# Patient Record
Sex: Female | Born: 1990 | Race: Black or African American | Hispanic: No | Marital: Married | Smoking: Former smoker
Health system: Southern US, Community
[De-identification: ages and names within clinical notes are randomized; demographics above are authoritative.]

## PROBLEM LIST (undated history)

## (undated) DIAGNOSIS — Z309 Encounter for contraceptive management, unspecified: Secondary | ICD-10-CM

## (undated) DIAGNOSIS — D649 Anemia, unspecified: Secondary | ICD-10-CM

## (undated) DIAGNOSIS — I10 Essential (primary) hypertension: Secondary | ICD-10-CM

## (undated) DIAGNOSIS — O164 Unspecified maternal hypertension, complicating childbirth: Secondary | ICD-10-CM

## (undated) DIAGNOSIS — Z789 Other specified health status: Secondary | ICD-10-CM

## (undated) DIAGNOSIS — Z3046 Encounter for surveillance of implantable subdermal contraceptive: Principal | ICD-10-CM

## (undated) DIAGNOSIS — E669 Obesity, unspecified: Secondary | ICD-10-CM

## (undated) DIAGNOSIS — Z331 Pregnant state, incidental: Secondary | ICD-10-CM

## (undated) HISTORY — DX: Obesity, unspecified: E66.9

## (undated) HISTORY — DX: Essential (primary) hypertension: I10

## (undated) HISTORY — DX: Other specified health status: Z78.9

## (undated) HISTORY — DX: Encounter for contraceptive management, unspecified: Z30.9

## (undated) HISTORY — DX: Encounter for surveillance of implantable subdermal contraceptive: Z30.46

## (undated) HISTORY — PX: WISDOM TOOTH EXTRACTION: SHX21

## (undated) HISTORY — DX: Pregnant state, incidental: Z33.1

---
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
## 2010-03-11 ENCOUNTER — Emergency Department (HOSPITAL_COMMUNITY): Admission: EM | Admit: 2010-03-11 | Discharge: 2010-03-11 | Payer: Self-pay | Admitting: Emergency Medicine

## 2010-12-03 NOTE — H&P (Signed)
NAME:  Kristina Scott, Kristina Scott NO.:  [id number]   MEDICAL RECORD NO.:  [id number]          PATIENT TYPE:  INP   LOCATION:  LDR1                           FACILITY:   PHYSICIAN:  Lazaro Arms, M.D.   DATE OF BIRTH:  1990-08-01   DATE OF ADMISSION:  11/29/2006  DATE OF DISCHARGE:  LH                              HISTORY & PHYSICAL   Kristina Scott is a 20 year old African American female, gravida 1, para 0  with estimated date of delivery of Nov 25, 2006, at 40-4/[redacted] weeks  gestation, who is admitted in an active phase of labor.  The patient is  3 cm, 100% effaced with spontaneous rupture of membranes after she got  to the hospital.   PAST MEDICAL HISTORY:  Past medical history is negative.   PAST SURGICAL HISTORY:  Past surgical history is negative.   PAST OB HISTORY:  Nulliparous.   ALLERGIES:  NONE.   MEDICATIONS:  None.   REVIEW OF SYSTEMS:  Review of systems is negative.   Blood type is AB+.  Varicella is immune.  Rubella is immune.  Hepatitis  B is negative.  HIV is nonreactive.  HSV-2 is negative.  Serology is  nonreactive.  Pap is normal.  GC and Chlamydia are both negative.  Repeat GC and Chlamydia were negative.  Group B Strep. was negative.  Glucola was 88.  Sickle screen was negative.  AFP was declined.   IMPRESSION:  1. Intrauterine pregnancy at 40-4/7 weeks' gestation.  2. Active labor.   PLAN:  The patient was admitted for expectant management for vaginal  delivery.      Lazaro Arms, M.D.  Electronically Signed     LHE/MEDQ  D:  11/29/2006  T:  11/29/2006  Job:  [id number]

## 2010-12-03 NOTE — H&P (Signed)
NAME:  Kristina Scott, Kristina Scott NO.:  [id number]   MEDICAL RECORD NO.:  [id number]          PATIENT TYPE:  INP   LOCATION:  NA                            FACILITY:  [facility]   PHYSICIAN:  Tilda Burrow, M.D. DATE OF BIRTH:  30-Aug-1990   DATE OF ADMISSION:  11/29/2006  DATE OF DISCHARGE:  05/12/2008LH                              HISTORY & PHYSICAL   CHIEF COMPLAINT:  Medical induction of labor secondary to post date.   HISTORY OF PRESENT ILLNESS:  Kristina Scott is a 20 year old  gravida 1, para  0 with an EDC of 11/25/2006 based on a 27-week ultrasound placing her at  [redacted] weeks gestation.  She began prenatal care in her third trimester and  has had regular visits since then.  Prenatal labs include blood type AB  positive, rubella immune.  HB SAG, HIV, HSV, RPR, gonorrhea and  Chlamydia are all negative.  Group B strep and sickle cell are also  negative.  1-hour GTT was normal.  Blood pressures have been 110-130s  over 60s to 80s.  Weight gain in her third trimester has been about 35  pounds.  She weighs 271 pounds today.  Fundal height growth is  appropriate.   PAST MEDICAL HISTORY:  Noncontributory except for a heart murmur which  is not appreciable, no surgical history, no drug allergies, no  medications.   FAMILY HISTORY:  Noncontributory.   SOCIAL HISTORY:  In 10th grade,  lives with her mother.  Father of the  baby is also 32 years old.   PHYSICAL EXAM:  HEENT: Within normal limits.  HEART: Regular rate and rhythm.  LUNGS:  Clear.  ABDOMEN:  Soft and nontender.  Fundal height is 40 cm.  PELVIC EXAM:  2 cm anterior thick, -2 station, vertex presentation.  Legs are trace edema.   IMPRESSION:  IUP at 41 weeks, medical induction of labor   PLAN:  Plan a Foley pre induction for 12/02/2006 up at [facility]  birthing center with AROM and Pitocin in the morning.      Jacklyn Shell, C.N.M.      Tilda Burrow, M.D.     FC/MEDQ  D:  11/26/2006  T:   11/26/2006  Job:  [id number]

## 2010-12-03 NOTE — Op Note (Signed)
NAME:  Kristina Scott, Kristina Scott NO.:  [id number]   MEDICAL RECORD NO.:  [id number]          PATIENT TYPE:  INP   LOCATION:  LDR1                          FACILITY:  [facility]   PHYSICIAN:  Lazaro Arms, M.D.   DATE OF BIRTH:  03-03-1991   DATE OF PROCEDURE:  11/29/2006  DATE OF DISCHARGE:                               OPERATIVE REPORT   [name] is a 20 year old black female or African American female,  gravida 1, para 0, estimated date of delivery is 11/25/2006 currently at  40-4/[redacted] weeks gestation, who presented to Labor delivery complaining of  regular uterine contractions.  She progressed quickly to first stage of  labor, was found to be complete and she had a maternal expulsive efforts  with two contractions and over intact perineum, delivered a viable  female infant at 5:52 with Apgars of 9 and 9 weighing 7 pounds and 9  ounces.  There was three-vessel cord.  Cord blood and cord gas sent.  Placenta was normal, intact, delivered at 0555.  Uterus was firm below  the umbilicus had little more bleeding than I was comfortable with and I  gave her Methergine to supplement her Pitocin.  Her perineum was intact.  No lacerations.  The infant will undergo routine neonatal care.  The  patient will undergo routine postpartum care.      Lazaro Arms, M.D.  Electronically Signed     LHE/MEDQ  D:  11/29/2006  T:  11/30/2006  Job:  [id number]

## 2010-12-03 NOTE — H&P (Signed)
NAME:  Kristina Scott, Kristina Scott NO.:  [id number]   MEDICAL RECORD NO.:  [id number]          PATIENT TYPE:  INP   LOCATION:  NA                            FACILITY:  [facility]   PHYSICIAN:  Tilda Burrow, M.D. DATE OF BIRTH:  1990/10/16   DATE OF ADMISSION:  11/29/2006  DATE OF DISCHARGE:  05/12/2008LH                              HISTORY & PHYSICAL   CHIEF COMPLAINT:  Medical induction of labor secondary to post date.   HISTORY OF PRESENT ILLNESS:  Kristina Scott is a 20 year old  gravida 1, para  0 with an EDC of 11/25/2006 based on a 27-week ultrasound placing her at  [redacted] weeks gestation.  She began prenatal care in her third trimester and  has had regular visits since then.  Prenatal labs include blood type AB  positive, rubella immune.  HB SAG, HIV, HSV, RPR, gonorrhea and  Chlamydia are all negative.  Group B strep and sickle cell are also  negative.  1-hour GTT was normal.  Blood pressures have been 110-130s  over 60s to 80s.  Weight gain in her third trimester has been about 35  pounds.  She weighs 271 pounds today.  Fundal height growth is  appropriate.   PAST MEDICAL HISTORY:  Noncontributory except for a heart murmur which  is not appreciable, no surgical history, no drug allergies, no  medications.   FAMILY HISTORY:  Noncontributory.   SOCIAL HISTORY:  In 10th grade,  lives with her mother.  Father of the  baby is also 63 years old.   PHYSICAL EXAM:  HEENT: Within normal limits.  HEART: Regular rate and rhythm.  LUNGS:  Clear.  ABDOMEN:  Soft and nontender.  Fundal height is 40 cm.  PELVIC EXAM:  2 cm anterior thick, -2 station, vertex presentation.  Legs are trace edema.   IMPRESSION:  IUP at 41 weeks, medical induction of labor   PLAN:  Plan a Foley pre induction for 12/02/2006 up at [facility]  birthing center with AROM and Pitocin in the morning.      Jacklyn Shell, C.N.M.      Tilda Burrow, M.D.  Electronically Signed    FC/MEDQ   D:  11/26/2006  T:  11/26/2006  Job:  [id number]

## 2011-07-30 ENCOUNTER — Emergency Department (HOSPITAL_COMMUNITY): Payer: Self-pay

## 2011-07-30 ENCOUNTER — Encounter (HOSPITAL_COMMUNITY): Payer: Self-pay | Admitting: *Deleted

## 2011-07-30 ENCOUNTER — Emergency Department (HOSPITAL_COMMUNITY)
Admission: EM | Admit: 2011-07-30 | Discharge: 2011-07-30 | Disposition: A | Discharge status: Home / Self Care | Payer: Self-pay | Attending: Emergency Medicine | Admitting: Emergency Medicine

## 2011-07-30 DIAGNOSIS — J189 Pneumonia, unspecified organism: Secondary | ICD-10-CM | POA: Insufficient documentation

## 2011-07-30 LAB — URINALYSIS, ROUTINE W REFLEX MICROSCOPIC
Bilirubin Urine: NEGATIVE
Ketones, ur: NEGATIVE mg/dL
Nitrite: NEGATIVE
Urobilinogen, UA: 1 mg/dL (ref 0.0–1.0)

## 2011-07-30 LAB — URINE MICROSCOPIC-ADD ON

## 2011-07-30 MED ORDER — AZITHROMYCIN 250 MG PO TABS
500.0000 mg | ORAL_TABLET | Freq: Once | ORAL | Status: AC
  Administered 2011-07-30: 500 mg via ORAL
  Filled 2011-07-30: qty 2

## 2011-07-30 MED ORDER — LIDOCAINE HCL (PF) 1 % IJ SOLN
INTRAMUSCULAR | Status: AC
  Administered 2011-07-30: 5 mL
  Filled 2011-07-30: qty 5

## 2011-07-30 MED ORDER — AZITHROMYCIN 250 MG PO TABS: ORAL_TABLET | ORAL | Status: AC

## 2011-07-30 MED ORDER — ONDANSETRON 8 MG PO TBDP
8.0000 mg | ORAL_TABLET | Freq: Once | ORAL | Status: AC
  Administered 2011-07-30: 8 mg via ORAL
  Filled 2011-07-30: qty 1

## 2011-07-30 MED ORDER — CEFTRIAXONE SODIUM 1 G IJ SOLR
1.0000 g | Freq: Once | INTRAMUSCULAR | Status: AC
  Administered 2011-07-30: 1 g via INTRAMUSCULAR
  Filled 2011-07-30: qty 10

## 2011-07-30 MED ORDER — PROMETHAZINE-CODEINE 6.25-10 MG/5ML PO SYRP: 5.0000 mL | ORAL_SOLUTION | ORAL | Status: AC | PRN

## 2011-07-30 NOTE — ED Notes (Signed)
Nausea for 2 days. Vomited today .  Toothache also

## 2011-07-31 NOTE — ED Provider Notes (Signed)
History     CSN: [id number]  Arrival date & time 07/30/11  1306   First MD Initiated Contact with Patient 07/30/11 1459      Chief Complaint  Patient presents with  . Emesis    (Consider location/radiation/quality/duration/timing/severity/associated sxs/prior treatment) Patient is a 21 y.o. female presenting with vomiting. The history is provided by the patient.  Emesis  This is a new problem. The current episode started 3 to 5 hours ago. Episode frequency: Patient vomited x 1 today. The problem has been gradually improving (Still with nausea). The emesis has an appearance of stomach contents. There has been no fever. Associated symptoms include chills, cough and myalgias. Pertinent negatives include no abdominal pain, no arthralgias, no diarrhea, no fever and no headaches. Associated symptoms comments: Cough has been productive of yellow sputum.  She reports having a recent cold including nasal congestion and sore throat.  She has noticed increased sob with exertion for the past 2 days.. Risk factors: No alleviators of symptoms.  She has taken tylenol with no relief of symptoms.    History reviewed. No pertinent past medical history.  History reviewed. No pertinent past surgical history.  History reviewed. No pertinent family history.  History  Substance Use Topics  . Smoking status: Never Smoker   . Smokeless tobacco: Not on file  . Alcohol Use: Yes    OB History    Grav Para Term Preterm Abortions TAB SAB Ect Mult Living                  Review of Systems  Constitutional: Positive for chills. Negative for fever.  HENT: Negative for congestion, sore throat and neck pain.   Eyes: Negative.   Respiratory: Positive for cough. Negative for chest tightness and shortness of breath.   Cardiovascular: Negative for chest pain.  Gastrointestinal: Positive for vomiting. Negative for nausea, abdominal pain and diarrhea.  Genitourinary: Negative.   Musculoskeletal: Positive for  myalgias. Negative for joint swelling and arthralgias.  Skin: Negative.  Negative for rash and wound.  Neurological: Negative for dizziness, weakness, light-headedness, numbness and headaches.  Hematological: Negative.   Psychiatric/Behavioral: Negative.     Allergies  Review of patient's allergies indicates no known allergies.  Home Medications   Current Outpatient Rx  Name Route Sig Dispense Refill  . ACETAMINOPHEN 500 MG PO TABS Oral Take 1,000 mg by mouth once as needed. For pain    . AZITHROMYCIN 250 MG PO TABS  Take one tablet daily for 4 more days. 4 each 0  . PROMETHAZINE-CODEINE 6.25-10 MG/5ML PO SYRP Oral Take 5 mLs by mouth every 4 (four) hours as needed for cough. 120 mL 0    BP 137/85  Pulse 114  Temp(Src) 98.9 F (37.2 C) (Oral)  Resp 21  Ht 5\' 4"  (1.626 m)  Wt 295 lb (133.811 kg)  BMI 50.64 kg/m2  SpO2 100%  LMP 07/13/2011  Physical Exam  Nursing note and vitals reviewed. Constitutional: She is oriented to person, place, and time. She appears well-developed and well-nourished.  HENT:  Head: Normocephalic and atraumatic.  Eyes: Conjunctivae are normal.  Neck: Normal range of motion.  Cardiovascular: Normal rate, regular rhythm, normal heart sounds and intact distal pulses.   Pulmonary/Chest: Effort normal. No respiratory distress. She has decreased breath sounds in the right middle field. She has no wheezes. She has no rales. She exhibits no tenderness.  Abdominal: Soft. Bowel sounds are normal. There is no tenderness.  Musculoskeletal: Normal range of motion. She  exhibits no edema and no tenderness.  Neurological: She is alert and oriented to person, place, and time.  Skin: Skin is warm and dry.  Psychiatric: She has a normal mood and affect.    ED Course  Procedures (including critical care time)  Labs Reviewed  URINALYSIS, ROUTINE W REFLEX MICROSCOPIC - Abnormal; Notable for the following:    APPearance HAZY (*)    Protein, ur TRACE (*)     Leukocytes, UA TRACE (*)    All other components within normal limits  URINE MICROSCOPIC-ADD ON - Abnormal; Notable for the following:    Squamous Epithelial / LPF MANY (*)    Bacteria, UA FEW (*)    All other components within normal limits  POCT PREGNANCY, URINE  LAB REPORT - SCANNED  POCT PREGNANCY, URINE   Dg Chest 2 View  07/30/2011  *RADIOLOGY REPORT*  Clinical Data: Cough and shortness of breath.  CHEST - 2 VIEW  Comparison: None.  Findings: Focal airspace disease in the right middle lobe is compatible with pneumonia.  Left lung is clear. Cardiopericardial silhouette is at upper limits of normal for size. Imaged bony structures of the thorax are intact.  IMPRESSION: Right middle lobe pneumonia.  Original Report Authenticated By: ERIC A. MANSELL, M.D.     1. Pneumonia       MDM  Rocephin 1 mg IM given.  z pack.  Rest,  Fluids.   Purulent productive cough.  Patient with no risk factors for PE.         Candis Musa, PA 07/31/11 [phone]

## 2011-08-01 NOTE — ED Provider Notes (Signed)
Medical screening examination/treatment/procedure(s) were performed by non-physician practitioner and as supervising physician I was immediately available for consultation/collaboration.   Shaia Porath L Kady Toothaker, MD 08/01/11 1452 

## 2011-09-29 ENCOUNTER — Emergency Department (HOSPITAL_COMMUNITY)
Admission: EM | Admit: 2011-09-29 | Discharge: 2011-09-30 | Disposition: A | Discharge status: Home / Self Care | Payer: Self-pay | Attending: Emergency Medicine | Admitting: Emergency Medicine

## 2011-09-29 ENCOUNTER — Encounter (HOSPITAL_COMMUNITY): Payer: Self-pay | Admitting: *Deleted

## 2011-09-29 DIAGNOSIS — K0381 Cracked tooth: Secondary | ICD-10-CM | POA: Insufficient documentation

## 2011-09-29 DIAGNOSIS — H9209 Otalgia, unspecified ear: Secondary | ICD-10-CM | POA: Insufficient documentation

## 2011-09-29 DIAGNOSIS — K0889 Other specified disorders of teeth and supporting structures: Secondary | ICD-10-CM

## 2011-09-29 DIAGNOSIS — J329 Chronic sinusitis, unspecified: Secondary | ICD-10-CM | POA: Insufficient documentation

## 2011-09-29 DIAGNOSIS — K089 Disorder of teeth and supporting structures, unspecified: Secondary | ICD-10-CM | POA: Insufficient documentation

## 2011-09-29 DIAGNOSIS — R51 Headache: Secondary | ICD-10-CM | POA: Insufficient documentation

## 2011-09-29 NOTE — ED Notes (Signed)
Pt reports tooth ache, headache at right eye, & bilateral ear pain.

## 2011-09-29 NOTE — ED Notes (Signed)
Headache, earache and dental pain

## 2011-09-30 MED ORDER — KETOROLAC TROMETHAMINE 60 MG/2ML IM SOLN
60.0000 mg | Freq: Once | INTRAMUSCULAR | Status: AC
  Administered 2011-09-30: 60 mg via INTRAMUSCULAR
  Filled 2011-09-30: qty 2

## 2011-09-30 MED ORDER — NAPROXEN 500 MG PO TABS: 500.0000 mg | ORAL_TABLET | Freq: Two times a day (BID) | ORAL | Status: AC

## 2011-09-30 MED ORDER — AZITHROMYCIN 250 MG PO TABS: 250.0000 mg | ORAL_TABLET | Freq: Every day | ORAL | Status: AC

## 2011-09-30 NOTE — ED Provider Notes (Signed)
History   This chart was scribed for Vida Roller, MD by Sofie Rower. The patient was seen in room APA02/APA02 and the patient's care was started at 12:21AM.    CSN: [id number]  Arrival date & time 09/29/11  2156   First MD Initiated Contact with Patient 09/30/11 0012      Chief Complaint  Patient presents with  . Otalgia    (Consider location/radiation/quality/duration/timing/severity/associated sxs/prior treatment) HPI  Kristina Scott is a 21 y.o. female who presents to the Emergency Department complaining of moderate, episodic dental pain onset two days ago with associated symptoms of earache, headache. Pt states the pain is a "throbbing pain".  She is aware that she has bilateral tooth fractures of her lower rear molars, the pain is constant, gradually getting worse, not associated with fevers chills nausea or vomiting. This seems to get worse when she chews, nothing makes it better.  She has had associated right for head pain, nasal discharge and postnasal drip.  Pt denies any fever, stiff neck, changes in hearing.    History  Substance Use Topics  . Smoking status: Never Smoker   . Smokeless tobacco: Not on file  . Alcohol Use: Yes    OB History    Grav Para Term Preterm Abortions TAB SAB Ect Mult Living                  Review of Systems  Constitutional: Negative for fever and chills.  HENT: Positive for ear pain and dental problem. Negative for sore throat, facial swelling, trouble swallowing and voice change.        Toothache  Gastrointestinal: Negative for nausea and vomiting.  Neurological: Positive for headaches.      Allergies  Review of patient's allergies indicates no known allergies.  Home Medications   Current Outpatient Rx  Name Route Sig Dispense Refill  . ACETAMINOPHEN 500 MG PO TABS Oral Take 1,000 mg by mouth once as needed. For pain    . AZITHROMYCIN 250 MG PO TABS Oral Take 1 tablet (250 mg total) by mouth daily. 500mg  PO day 1,  then 250mg  PO days 205 6 tablet 0  . NAPROXEN 500 MG PO TABS Oral Take 1 tablet (500 mg total) by mouth 2 (two) times daily with a meal. 30 tablet 0    BP 140/87  Pulse 89  Temp(Src) 98 F (36.7 C) (Oral)  Resp 16  Ht 5\' 4"  (1.626 m)  Wt 309 lb 6.4 oz (140.343 kg)  BMI 53.11 kg/m2  SpO2 100%  LMP 09/15/2011  Physical Exam  Nursing note and vitals reviewed. Constitutional: She is oriented to person, place, and time. She appears well-developed and well-nourished.  HENT:  Head: Normocephalic and atraumatic.       Rear bottom molars fractured.   Eyes: Conjunctivae and EOM are normal. No scleral icterus.  Neck: Normal range of motion. Neck supple. No thyromegaly present.  Cardiovascular: Normal rate, regular rhythm and normal heart sounds.  Exam reveals no gallop and no friction rub.   No murmur heard. Pulmonary/Chest: Effort normal and breath sounds normal. No stridor. She has no wheezes. She has no rales. She exhibits no tenderness.  Musculoskeletal: Normal range of motion. She exhibits no edema.  Lymphadenopathy:    She has no cervical adenopathy.  Neurological: She is alert and oriented to person, place, and time. Coordination normal.  Skin: Skin is warm and dry. No rash noted. No erythema.  Psychiatric: She has a normal mood and  affect. Her behavior is normal.    ED Course  Procedures (including critical care time)  DIAGNOSTIC STUDIES: Oxygen Saturation is 100% on room air, normal by my interpretation.    COORDINATION OF CARE:     Labs Reviewed - No data to display No results found.   1. Sinusitis   2. Toothache     12:36- EDP at bedside discuss treatment plan.   MDM  Though there is dental disease that would explain the patient's symptoms, there are no abscesses or swelling that would require surgical drainage. Because of the pain that she has, fractured teeth and headache will treat with antibiotics to prevent infection, Naprosyn for inflammation, dental  followup.  At this time there is no signs of abscesses, no Ludwig's angina, no tenderness of the tongue. Vital signs are normal, afebrile, no tachycardia or hypoxia       I personally performed the services described in this documentation, which was scribed in my presence. The recorded information has been reviewed and considered.          Vida Roller, MD 09/30/11 [phone]

## 2011-09-30 NOTE — ED Notes (Signed)
Pt alert & oriented x4, stable gait. Pt given discharge instructions, paperwork & prescription(s). Patient instructed to stop at the registration desk to finish any additional paperwork. pt verbalized understanding. Pt left department w/ no further questions.  

## 2011-09-30 NOTE — Discharge Instructions (Signed)
 RESOURCE GUIDE  Dental Problems  Patients with Medicaid: [facility]                     [facility] [address]                                           [address] Phone:  [phone]                                                  Phone:  [phone]  If unable to pay or uninsured, contact:  [facility] or [facility] to become qualified for the adult dental clinic.  Chronic Pain Problems Contact [facility]  [phone] Patients need to be referred by their primary care doctor.  Insufficient Money for Medicine Contact [employer]:  call "211" or [facility] 271-5999.  No Primary Care Doctor Call [facility]  832-8000 Other agencies that provide inexpensive medical care    [facility]  832-8035    [facility]  832-7272    [facility]  [phone]    Women's Clinic  [phone]    [facility]  [phone]    [facility]  [phone]  Psychological Services [facility]  [phone] [facility]  [phone] [facility]   800 853-5163 (emergency services 641-4993)  Substance Abuse Resources [facility]  336-882-2125 [facility] 336-784-9470 [facility] 336-285-9073 [facility] 336-845-3988 Residential & Outpatient Substance Abuse Program  800-659-3381  Abuse/Neglect [place] Child Abuse Hotline (336) 641-3795 [place] Child Abuse Hotline 800-378-5315 (After Hours)  Emergency Shelter [facility] (336) 271-5985  M[facility] (336) 275-9566 [facility] (704) 372-4663  MRSA Hotline #:   832-7006    [place] [facility]  [facility] [place]     [employer]                          [facility] [address] [place]                       [address]      [address]  [place]                                                [place]                            [place] Phone:  [phone]                                   Phone:  [phone]                 Phone:  [phone]  [facility] Phone:    [phone]  [place] Child Abuse Hotline (336) 342-1394 (336) 342-3537 (After Hours)   

## 2012-10-22 ENCOUNTER — Emergency Department (HOSPITAL_COMMUNITY)
Admission: EM | Admit: 2012-10-22 | Discharge: 2012-10-22 | Disposition: A | Discharge status: Home / Self Care | Payer: Medicaid Other | Attending: Emergency Medicine | Admitting: Emergency Medicine

## 2012-10-22 ENCOUNTER — Encounter (HOSPITAL_COMMUNITY): Payer: Self-pay | Admitting: *Deleted

## 2012-10-22 DIAGNOSIS — E669 Obesity, unspecified: Secondary | ICD-10-CM | POA: Insufficient documentation

## 2012-10-22 DIAGNOSIS — K529 Noninfective gastroenteritis and colitis, unspecified: Secondary | ICD-10-CM

## 2012-10-22 DIAGNOSIS — R197 Diarrhea, unspecified: Secondary | ICD-10-CM | POA: Insufficient documentation

## 2012-10-22 DIAGNOSIS — R112 Nausea with vomiting, unspecified: Secondary | ICD-10-CM | POA: Insufficient documentation

## 2012-10-22 DIAGNOSIS — Z3202 Encounter for pregnancy test, result negative: Secondary | ICD-10-CM | POA: Insufficient documentation

## 2012-10-22 DIAGNOSIS — K5289 Other specified noninfective gastroenteritis and colitis: Secondary | ICD-10-CM | POA: Insufficient documentation

## 2012-10-22 LAB — URINALYSIS, ROUTINE W REFLEX MICROSCOPIC
Bilirubin Urine: NEGATIVE
Hgb urine dipstick: NEGATIVE
Ketones, ur: NEGATIVE mg/dL
Specific Gravity, Urine: 1.015 (ref 1.005–1.030)
pH: 6.5 (ref 5.0–8.0)

## 2012-10-22 LAB — CBC WITH DIFFERENTIAL/PLATELET
Eosinophils Absolute: 0.1 10*3/uL (ref 0.0–0.7)
HCT: 34.2 % — ABNORMAL LOW (ref 36.0–46.0)
Hemoglobin: 11.5 g/dL — ABNORMAL LOW (ref 12.0–15.0)
Lymphs Abs: 2.8 10*3/uL (ref 0.7–4.0)
MCH: 29.4 pg (ref 26.0–34.0)
MCHC: 33.6 g/dL (ref 30.0–36.0)
MCV: 87.5 fL (ref 78.0–100.0)
Monocytes Absolute: 0.3 10*3/uL (ref 0.1–1.0)
Monocytes Relative: 6 % (ref 3–12)
Neutrophils Relative %: 47 % (ref 43–77)
RBC: 3.91 MIL/uL (ref 3.87–5.11)

## 2012-10-22 LAB — BASIC METABOLIC PANEL
BUN: 8 mg/dL (ref 6–23)
Chloride: 104 mEq/L (ref 96–112)
Creatinine, Ser: 0.63 mg/dL (ref 0.50–1.10)
GFR calc non Af Amer: >90 mL/min (ref >90)
Glucose, Bld: 84 mg/dL (ref 70–99)
Potassium: 4.3 mEq/L (ref 3.5–5.1)

## 2012-10-22 MED ORDER — SODIUM CHLORIDE 0.9 % IV SOLN: 1000.0000 mL | INTRAVENOUS | Status: DC

## 2012-10-22 MED ORDER — ONDANSETRON HCL 4 MG/2ML IJ SOLN
4.0000 mg | Freq: Once | INTRAMUSCULAR | Status: AC
  Administered 2012-10-22: 4 mg via INTRAVENOUS
  Filled 2012-10-22: qty 2

## 2012-10-22 MED ORDER — SODIUM CHLORIDE 0.9 % IV SOLN
1000.0000 mL | Freq: Once | INTRAVENOUS | Status: AC
  Administered 2012-10-22: 1000 mL via INTRAVENOUS

## 2012-10-22 MED ORDER — METOCLOPRAMIDE HCL 10 MG PO TABS: 10.0000 mg | ORAL_TABLET | Freq: Four times a day (QID) | ORAL | Status: DC | PRN

## 2012-10-22 NOTE — ED Provider Notes (Signed)
History    This chart was scribed for Kristina Booze, MD by Charolett Bumpers, ED Scribe. The patient was seen in room APA03/APA03. Patient's care was started at 0735.   CSN: [id number]  Arrival date & time 10/22/12  0719   First MD Initiated Contact with Patient 10/22/12 [phone]      Chief Complaint  Patient presents with  . Abdominal Pain  . Nausea  . Emesis    The history is provided by the patient. No language interpreter was used.  Kristina Scott is a 22 y.o. female who presents to the Emergency Department complaining of lower abdominal pain that started last night and is described as crampy. She rates her pain 6/10 and denies any modifying factors. She states that vomiting does not modify her pain. She reports associated nausea, vomiting and diarrhea that started yesterday as well with 4 episodes of vomiting and 2 episodes of diarrhea. She denies any fever, chills, diaphoresis.She states that her symptoms have since improved some with no diarrhea today. She reports her daughter has been sick with a similar stomach virus with the same symptoms. She has Implanon in place as birth control. She denies smoking and admits to occasional alcohol use.     History reviewed. No pertinent past medical history.  History reviewed. No pertinent past surgical history.  History reviewed. No pertinent family history.  History  Substance Use Topics  . Smoking status: Never Smoker   . Smokeless tobacco: Not on file  . Alcohol Use: Yes     Comment: socially    OB History   Grav Para Term Preterm Abortions TAB SAB Ect Mult Living   1 1 1              Review of Systems  Constitutional: Negative for fever, chills and diaphoresis.  Gastrointestinal: Positive for nausea, vomiting, abdominal pain and diarrhea.  All other systems reviewed and are negative.    Allergies  Review of patient's allergies indicates no known allergies.  Home Medications   Current Outpatient Rx  Name   Route  Sig  Dispense  Refill  . acetaminophen (TYLENOL) 500 MG tablet   Oral   Take 1,000 mg by mouth once as needed. For pain           BP 142/77  Pulse 72  Temp(Src) 98.3 F (36.8 C) (Oral)  Ht 5\' 4"  (1.626 m)  Wt 294 lb 12.8 oz (133.72 kg)  BMI 50.58 kg/m2  SpO2 100%  LMP 09/12/2012  Physical Exam  Nursing note and vitals reviewed. Constitutional: She is oriented to person, place, and time. She appears well-developed and well-nourished. No distress.  Obese.   HENT:  Head: Normocephalic and atraumatic.  Eyes: EOM are normal. Pupils are equal, round, and reactive to light.  Neck: Normal range of motion. Neck supple. No tracheal deviation present.  Cardiovascular: Normal rate, regular rhythm and normal heart sounds.   Pulmonary/Chest: Effort normal and breath sounds normal. No respiratory distress.  Abdominal: Soft. She exhibits no distension. Bowel sounds are decreased. There is no tenderness.  Musculoskeletal: Normal range of motion. She exhibits no edema.  Neurological: She is alert and oriented to person, place, and time.  Skin: Skin is warm and dry.  Psychiatric: She has a normal mood and affect. Her behavior is normal.    ED Course  Procedures (including critical care time)  DIAGNOSTIC STUDIES: Oxygen Saturation is 100% on room air, normal by my interpretation.    COORDINATION OF CARE:  7:39 AM-Discussed planned course of treatment with the patient including IV fluids, Zofran, CBC with diff, BMP, UA and pregnancy, who is agreeable at this time.   7:45-Medication Orders: Ondansetron (Zofran) injection 4 mg-once; 0.9% sodium chloride infusion 1,000 mL-once; 0.9% sodium chloride infusion 1,000 mL-continuous   Results for orders placed during the hospital encounter of 10/22/12  CBC WITH DIFFERENTIAL      Result Value Range   WBC 6.1  4.0 - 10.5 K/uL   RBC 3.91  3.87 - 5.11 MIL/uL   Hemoglobin 11.5 (*) 12.0 - 15.0 g/dL   HCT 44.0 (*) 10.2 - 72.5 %   MCV 87.5   78.0 - 100.0 fL   MCH 29.4  26.0 - 34.0 pg   MCHC 33.6  30.0 - 36.0 g/dL   RDW 36.6  44.0 - 34.7 %   Platelets 319  150 - 400 K/uL   Neutrophils Relative 47  43 - 77 %   Neutro Abs 2.8  1.7 - 7.7 K/uL   Lymphocytes Relative 46  12 - 46 %   Lymphs Abs 2.8  0.7 - 4.0 K/uL   Monocytes Relative 6  3 - 12 %   Monocytes Absolute 0.3  0.1 - 1.0 K/uL   Eosinophils Relative 1  0 - 5 %   Eosinophils Absolute 0.1  0.0 - 0.7 K/uL   Basophils Relative 0  0 - 1 %   Basophils Absolute 0.0  0.0 - 0.1 K/uL  BASIC METABOLIC PANEL      Result Value Range   Sodium 136  135 - 145 mEq/L   Potassium 4.3  3.5 - 5.1 mEq/L   Chloride 104  96 - 112 mEq/L   CO2 23  19 - 32 mEq/L   Glucose, Bld 84  70 - 99 mg/dL   BUN 8  6 - 23 mg/dL   Creatinine, Ser 4.25  0.50 - 1.10 mg/dL   Calcium 9.3  8.4 - 95.6 mg/dL   GFR calc non Af Amer >90  >90 mL/min   GFR calc Af Amer >90  >90 mL/min  URINALYSIS, ROUTINE W REFLEX MICROSCOPIC      Result Value Range   Color, Urine YELLOW  YELLOW   APPearance CLEAR  CLEAR   Specific Gravity, Urine 1.015  1.005 - 1.030   pH 6.5  5.0 - 8.0   Glucose, UA NEGATIVE  NEGATIVE mg/dL   Hgb urine dipstick NEGATIVE  NEGATIVE   Bilirubin Urine NEGATIVE  NEGATIVE   Ketones, ur NEGATIVE  NEGATIVE mg/dL   Protein, ur NEGATIVE  NEGATIVE mg/dL   Urobilinogen, UA 0.2  0.0 - 1.0 mg/dL   Nitrite NEGATIVE  NEGATIVE   Leukocytes, UA NEGATIVE  NEGATIVE  PREGNANCY, URINE      Result Value Range   Preg Test, Ur NEGATIVE  NEGATIVE     1. Gastroenteritis       MDM  Vomiting and diarrhea with abdominal pain most consistent with a viral gastroenteritis-especially with a known sick contacts. She'll be given IV fluids and IV ondansetron.  Laboratory workup is significant for mild anemia. She feels much better after above noted treatment. She is sent home with prescription for metoclopramide.   I personally performed the services described in this documentation, which was scribed in my  presence. The recorded information has been reviewed and is accurate.         Kristina Booze, MD 10/22/12 [phone]

## 2012-10-22 NOTE — ED Notes (Signed)
Lower abdominal pain 7/10 began yesterday w/approx. 4 episodes of vomiting since then. Two episodes of diarrhea yesterday, none today.  Daughter was been sick w/stomach virus Monday, but better now.

## 2013-01-28 ENCOUNTER — Emergency Department (HOSPITAL_COMMUNITY)
Admission: EM | Admit: 2013-01-28 | Discharge: 2013-01-28 | Disposition: A | Discharge status: Home / Self Care | Payer: Medicaid Other | Attending: Emergency Medicine | Admitting: Emergency Medicine

## 2013-01-28 ENCOUNTER — Encounter (HOSPITAL_COMMUNITY): Payer: Self-pay | Admitting: *Deleted

## 2013-01-28 DIAGNOSIS — Z3202 Encounter for pregnancy test, result negative: Secondary | ICD-10-CM | POA: Insufficient documentation

## 2013-01-28 DIAGNOSIS — R059 Cough, unspecified: Secondary | ICD-10-CM | POA: Insufficient documentation

## 2013-01-28 DIAGNOSIS — R05 Cough: Secondary | ICD-10-CM | POA: Insufficient documentation

## 2013-01-28 DIAGNOSIS — R109 Unspecified abdominal pain: Secondary | ICD-10-CM | POA: Insufficient documentation

## 2013-01-28 DIAGNOSIS — R112 Nausea with vomiting, unspecified: Secondary | ICD-10-CM | POA: Insufficient documentation

## 2013-01-28 DIAGNOSIS — R1110 Vomiting, unspecified: Secondary | ICD-10-CM

## 2013-01-28 DIAGNOSIS — R350 Frequency of micturition: Secondary | ICD-10-CM | POA: Insufficient documentation

## 2013-01-28 LAB — URINALYSIS, ROUTINE W REFLEX MICROSCOPIC
Bilirubin Urine: NEGATIVE
Nitrite: NEGATIVE
Specific Gravity, Urine: 1.02 (ref 1.005–1.030)
Urobilinogen, UA: 0.2 mg/dL (ref 0.0–1.0)

## 2013-01-28 LAB — CBC WITH DIFFERENTIAL/PLATELET
Eosinophils Relative: 1 % (ref 0–5)
HCT: 31.4 % — ABNORMAL LOW (ref 36.0–46.0)
Lymphocytes Relative: 32 % (ref 12–46)
Lymphs Abs: 2.2 10*3/uL (ref 0.7–4.0)
MCV: 89 fL (ref 78.0–100.0)
Monocytes Absolute: 0.5 10*3/uL (ref 0.1–1.0)
Monocytes Relative: 7 % (ref 3–12)
RBC: 3.53 MIL/uL — ABNORMAL LOW (ref 3.87–5.11)
WBC: 6.8 10*3/uL (ref 4.0–10.5)

## 2013-01-28 LAB — COMPREHENSIVE METABOLIC PANEL
ALT: 14 U/L (ref 0–35)
BUN: 10 mg/dL (ref 6–23)
CO2: 24 mEq/L (ref 19–32)
Calcium: 9.1 mg/dL (ref 8.4–10.5)
Creatinine, Ser: 0.68 mg/dL (ref 0.50–1.10)
GFR calc Af Amer: >90 mL/min (ref >90)
GFR calc non Af Amer: >90 mL/min (ref >90)
Glucose, Bld: 92 mg/dL (ref 70–99)

## 2013-01-28 MED ORDER — SODIUM CHLORIDE 0.9 % IV BOLUS (SEPSIS)
1000.0000 mL | Freq: Once | INTRAVENOUS | Status: AC
  Administered 2013-01-28: 1000 mL via INTRAVENOUS

## 2013-01-28 MED ORDER — ONDANSETRON HCL 4 MG/2ML IJ SOLN
4.0000 mg | Freq: Once | INTRAMUSCULAR | Status: AC
  Administered 2013-01-28: 4 mg via INTRAVENOUS
  Filled 2013-01-28: qty 2

## 2013-01-28 MED ORDER — PROMETHAZINE HCL 25 MG PO TABS: 25.0000 mg | ORAL_TABLET | Freq: Four times a day (QID) | ORAL | Status: DC | PRN

## 2013-01-28 NOTE — ED Notes (Signed)
N/v/d and mid abd pain x 2 days.

## 2013-01-28 NOTE — ED Provider Notes (Signed)
History    This chart was scribed for Benny Lennert, MD, MD by Ashley Jacobs, ED Scribe and Bennett Scrape, ED Scribe. The patient was seen in room APA04/APA04 and the patient's care was started at 7:30 AM.  CSN: [id number] Arrival date & time 01/28/13  0715     Chief Complaint  Patient presents with  . Emesis    Patient is a 22 y.o. female presenting with vomiting. The history is provided by the patient. No language interpreter was used.  Emesis Severity:  Moderate Timing:  Constant Associated symptoms: abdominal pain and diarrhea   Associated symptoms: no chills and no headaches     HPI Comments: Kristina Scott is a 22 y.o. female who presents to the Emergency Department complaining of constant emesis with onset of 2 days pta. Pt also complains of mild abdominal pain, diarrhea, and increased urinary frequency upon onset. While at work, pt reports that she vomited 3 times this morning and nothing seems to worsen or relieve the emesis. She is currently on Reglan for nausea.  Pt denies hematochezia, hematemesis, chills and fever. Pt denies irregular menses.    History reviewed. No pertinent past medical history.  History reviewed. No pertinent past surgical history.  No family history on file.  History  Substance Use Topics  . Smoking status: Never Smoker   . Smokeless tobacco: Not on file  . Alcohol Use: No  Pt currently employed at [employer].   OB History   Grav Para Term Preterm Abortions TAB SAB Ect Mult Living   1 1 1             Review of Systems  Constitutional: Negative for fever, chills and fatigue.  HENT: Negative for congestion, sinus pressure and ear discharge.   Eyes: Negative for discharge.  Respiratory: Positive for cough.   Cardiovascular: Negative for chest pain.  Gastrointestinal: Positive for nausea, vomiting, abdominal pain and diarrhea. Negative for blood in stool.  Genitourinary: Positive for frequency. Negative for hematuria and  menstrual problem.  Musculoskeletal: Negative for back pain.  Skin: Negative for rash.  Neurological: Negative for seizures and headaches.  Psychiatric/Behavioral: Negative for hallucinations.    Allergies  Review of patient's allergies indicates no known allergies.  Home Medications   Current Outpatient Rx  Name  Route  Sig  Dispense  Refill  . metoCLOPramide (REGLAN) 10 MG tablet   Oral   Take 1 tablet (10 mg total) by mouth every 6 (six) hours as needed (Nausea).   30 tablet   0    BP 131/83  Pulse 82  Temp(Src) 98.9 F (37.2 C) (Oral)  Resp 16  Ht 5\' 4"  (1.626 m)  SpO2 98%  LMP 01/10/2013 Physical Exam  Nursing note and vitals reviewed. Constitutional: She is oriented to person, place, and time. She appears well-developed and well-nourished.  HENT:  Head: Normocephalic.  Eyes: Conjunctivae and EOM are normal. No scleral icterus.  Neck: Neck supple. No thyromegaly present.  Cardiovascular: Normal rate and regular rhythm.  Exam reveals no gallop and no friction rub.   No murmur heard. Pulmonary/Chest: Effort normal and breath sounds normal. No stridor. She has no wheezes. She has no rales. She exhibits no tenderness.  Abdominal: She exhibits no distension. There is tenderness. There is no rebound.  Mild tenderness throughout entire abdomen.  Musculoskeletal: Normal range of motion. She exhibits edema (1+ edema in bilateral ankles).  Lymphadenopathy:    She has no cervical adenopathy.  Neurological: She is alert  and oriented to person, place, and time. Coordination normal.  Skin: Skin is warm and dry. No rash noted. No erythema.  Psychiatric: She has a normal mood and affect. Her behavior is normal.    ED Course  Procedures (including critical care time) DIAGNOSTIC STUDIES: Oxygen Saturation is 98% on room air, normal by my interpretation.    COORDINATION OF CARE: 7:33 AM Discussed course of care with pt . Pt understands and agrees. 9:37 AM Dr informed of     Labs Reviewed  CBC WITH DIFFERENTIAL - Abnormal; Notable for the following:    RBC 3.53 (*)    Hemoglobin 10.3 (*)    HCT 31.4 (*)    All other components within normal limits  COMPREHENSIVE METABOLIC PANEL - Abnormal; Notable for the following:    Albumin 3.2 (*)    Total Bilirubin 0.2 (*)    All other components within normal limits  URINALYSIS, ROUTINE W REFLEX MICROSCOPIC  PREGNANCY, URINE   No results found. No diagnosis found.  MDM    The chart was scribed for me under my direct supervision.  I personally performed the history, physical, and medical decision making and all procedures in the evaluation of this patient.Benny Lennert, MD 01/28/13 [phone]

## 2013-07-05 ENCOUNTER — Encounter (HOSPITAL_COMMUNITY): Payer: Self-pay | Admitting: Emergency Medicine

## 2013-07-05 ENCOUNTER — Emergency Department (HOSPITAL_COMMUNITY)
Admission: EM | Admit: 2013-07-05 | Discharge: 2013-07-05 | Disposition: A | Discharge status: Home / Self Care | Payer: Medicaid Other | Attending: Emergency Medicine | Admitting: Emergency Medicine

## 2013-07-05 DIAGNOSIS — J069 Acute upper respiratory infection, unspecified: Secondary | ICD-10-CM

## 2013-07-05 MED ORDER — GUAIFENESIN 100 MG/5ML PO SOLN: 5.0000 mL | ORAL | Status: DC | PRN

## 2013-07-05 NOTE — ED Provider Notes (Signed)
CSN: [id number]     Arrival date & time 07/05/13  1318 History   First MD Initiated Contact with Patient 07/05/13 1325     Chief Complaint  Patient presents with  . Sore Throat  . Cough   (Consider location/radiation/quality/duration/timing/severity/associated sxs/prior Treatment) HPI Comments: Patient presents with a chief complaint of sore throat, nasal congestion, productive cough, and headache.  She reports that symptoms have been present since yesterday and are gradually worsening.  She states that she has been taking Theraflu without relief.  She denies fever or chills.  Denies chest pain or SOB.  Denies nausea, vomiting, and diarrhea.  No known sick contacts.  She is otherwise healthy.  The history is provided by the patient.    History reviewed. No pertinent past medical history. History reviewed. No pertinent past surgical history. No family history on file. History  Substance Use Topics  . Smoking status: Never Smoker   . Smokeless tobacco: Not on file  . Alcohol Use: No   OB History   Grav Para Term Preterm Abortions TAB SAB Ect Mult Living   1 1 1             Review of Systems  All other systems reviewed and are negative.    Allergies  Review of patient's allergies indicates no known allergies.  Home Medications   Current Outpatient Rx  Name  Route  Sig  Dispense  Refill  . promethazine (PHENERGAN) 25 MG tablet   Oral   Take 1 tablet (25 mg total) by mouth every 6 (six) hours as needed for nausea.   15 tablet   0    BP 166/98  Pulse 103  Temp(Src) 98.9 F (37.2 C) (Oral)  Resp 14  Ht 5\' 4"  (1.626 m)  Wt 319 lb 8 oz (144.924 kg)  BMI 54.81 kg/m2  SpO2 100%  LMP 06/13/2013 Physical Exam  Nursing note and vitals reviewed. Constitutional: She appears well-developed and well-nourished.  HENT:  Head: Normocephalic and atraumatic.  Mouth/Throat: Uvula is midline, oropharynx is clear and moist and mucous membranes are normal. No trismus in the jaw. No  uvula swelling. No oropharyngeal exudate or posterior oropharyngeal edema.  Mild erythema of the throat  Neck: Normal range of motion. Neck supple.  Cardiovascular: Normal rate, regular rhythm and normal heart sounds.   Pulmonary/Chest: Effort normal and breath sounds normal. No respiratory distress. She has no wheezes. She has no rales.  Musculoskeletal: Normal range of motion.  Lymphadenopathy:    She has no cervical adenopathy.  Neurological: She is alert.  Skin: Skin is warm and dry.  Psychiatric: She has a normal mood and affect.    ED Course  Procedures (including critical care time) Labs Review Labs Reviewed - No data to display Imaging Review No results found.  EKG Interpretation   None       MDM  No diagnosis found. Patient presenting with sore throat, congestion, productive cough, and headache since yesterday.  Patient is afebrile.  Pulse ox 100 on RA.  Lungs CTAB  Therefore, do not feel that CXR is indicated at this time.  Feel that symptoms are most consistent with viral illness.  Patient discharged home with symptomatic treatment.  Patient in agreement with the plan.  Patient stable for discharge.  Return precautions given.    Santiago Glad, PA-C 07/05/13 1425

## 2013-07-05 NOTE — ED Notes (Signed)
Throat, red with exudate.  Cough with yellow sputum.  Alert, NAD at present.

## 2013-07-05 NOTE — ED Notes (Signed)
Pt c/o sore throat and productive cough-yellow sputum x 2 days.

## 2013-07-05 NOTE — ED Provider Notes (Signed)
Medical screening examination/treatment/procedure(s) were performed by non-physician practitioner and as supervising physician I was immediately available for consultation/collaboration.  EKG Interpretation   None        Donnetta Hutching, MD 07/05/13 1510

## 2013-09-28 ENCOUNTER — Encounter (HOSPITAL_COMMUNITY): Payer: Self-pay | Admitting: Emergency Medicine

## 2013-09-28 ENCOUNTER — Emergency Department (HOSPITAL_COMMUNITY): Payer: Medicaid Other

## 2013-09-28 ENCOUNTER — Emergency Department (HOSPITAL_COMMUNITY)
Admission: EM | Admit: 2013-09-28 | Discharge: 2013-09-28 | Disposition: A | Discharge status: Home / Self Care | Payer: Medicaid Other | Attending: Emergency Medicine | Admitting: Emergency Medicine

## 2013-09-28 DIAGNOSIS — M79672 Pain in left foot: Secondary | ICD-10-CM

## 2013-09-28 DIAGNOSIS — M7732 Calcaneal spur, left foot: Secondary | ICD-10-CM

## 2013-09-28 DIAGNOSIS — M7730 Calcaneal spur, unspecified foot: Secondary | ICD-10-CM | POA: Insufficient documentation

## 2013-09-28 DIAGNOSIS — E669 Obesity, unspecified: Secondary | ICD-10-CM | POA: Insufficient documentation

## 2013-09-28 MED ORDER — NAPROXEN 500 MG PO TABS: 500.0000 mg | ORAL_TABLET | Freq: Two times a day (BID) | ORAL | Status: DC

## 2013-09-28 NOTE — Discharge Instructions (Signed)
Heel Spur A heel spur is a hook of bone that can form on the calcaneus (the heel bone and the largest bone of the foot). Heel spurs are often associated with plantar fasciitis and usually come in people who have had the problem for an extended period of time. The cause of the relationship is unknown. The pain associated with them is thought to be caused by an inflammation (soreness and redness) of the plantar fascia rather than the spur itself. The plantar fascia is a thick fibrous like tissue that runs from the calcaneus (heel bone) to the ball of the foot. This strong, tight tissue helps maintain the arch of your foot. It helps distribute the weight across your foot as you walk or run. Stresses placed on the plantar fascia can be tremendous. When it is inflamed normal activities become painful. Pain is worse in the morning after sleeping. After sleeping the plantar fascia is tight. The first movements stretch the fascia and this causes pain. As the tendon loosens, the pain usually gets better. It often returns with too much standing or walking.  About 70% of patients with plantar fasciitis have a heel spur. About half of people without foot pain also have heel spurs. DIAGNOSIS  The diagnosis of a heel spur is made by X-ray. The X-ray shows a hook of bone protruding from the bottom of the calcaneus at the point where the plantar fascia is attached to the heel bone.  TREATMENT  It is necessary to find out what is causing the stretching of the plantar fascia. If the cause is over-pronation (flat feet), orthotics and proper foot ware may help.  Stretching exercises, losing weight, wearing shoes that have a cushioned heel that absorbs shock, and elevating the heel with the use of a heel cradle, heel cup, or orthotics may all help. Heel cradles and heel cups provide extra comfort and cushion to the heel, and reduce the amount of shock to the sore area. AVOIDING THE PAIN OF PLANTAR FASCIITIS AND HEEL  SPURS  Consult a sports medicine professional before beginning a new exercise program.  Walking programs offer a good workout. There is a lower chance of overuse injuries common to the runners. There is less impact and less jarring of the joints.  Begin all new exercise programs slowly. If problems or pains develop, decrease the amount of time or distance until you are at a comfortable level.  Wear good shoes and replace them regularly.  Stretch your foot and the heel cords at the back of the ankle (Achilles tendons) both before and after exercise.  Run or exercise on even surfaces that are not hard. For example, asphalt is better than pavement.  Do not run barefoot on hard surfaces.  If using a treadmill, vary the incline.  Do not continue to workout if you have foot or joint problems. Seek professional help if they do not improve. HOME CARE INSTRUCTIONS   Avoid activities that cause you pain until you recover.  Use ice or cold packs to the problem or painful areas after working out.  Only take over-the-counter or prescription medicines for pain, discomfort, or fever as directed by your caregiver.  Soft shoe inserts or athletic shoes with air or gel sole cushions may be helpful.  If problems continue or become more severe, consult a sports medicine caregiver. Cortisone is a potent anti-inflammatory medication that may be injected into the painful area. You can discuss this treatment with your caregiver. MAKE SURE YOU:     Understand these instructions.  Will watch your condition.  Will get help right away if you are not doing well or get worse. Document Released: 08/13/2005 Document Revised: 09/29/2011 Document Reviewed: 10/15/2005 [employer] Patient Information 2014 [employer].  

## 2013-09-28 NOTE — ED Notes (Signed)
Patient given water per PA approval. 

## 2013-09-28 NOTE — ED Notes (Signed)
Patient c/o left foot swelling that started yesterday. Denies any known injury.

## 2013-09-28 NOTE — ED Provider Notes (Signed)
CSN: [id number]     Arrival date & time 09/28/13  0759 History   First MD Initiated Contact with Patient 09/28/13 0809     Chief Complaint  Patient presents with  . Leg Swelling     (Consider location/radiation/quality/duration/timing/severity/associated sxs/prior Treatment) Patient is a 23 y.o. female presenting with lower extremity pain.  Foot Pain This is a new problem. The current episode started yesterday. The problem occurs constantly. The problem has been unchanged. Associated symptoms include arthralgias. Pertinent negatives include no chest pain, chills, congestion, diaphoresis, fever, headaches, joint swelling, nausea, numbness, rash or weakness. The symptoms are aggravated by standing and bending. She has tried nothing for the symptoms. The treatment provided no relief.   Patient complains of pain and swelling to the dorsal left foot that began yesterday. She describes the pain is "aching". Pain is worse with weightbearing and twisting of her foot. She denies known injury. Patient states that she has been out of work for one week and her pain began after returning to work yesterday. She states that she stands and walks most of the day. She also reports that she usually wears shoes that do not have much support. She denies redness, history of fracture or DVT, or any pain or swelling proximal to the foot.  History reviewed. No pertinent past medical history. History reviewed. No pertinent past surgical history. History reviewed. No pertinent family history. History  Substance Use Topics  . Smoking status: Never Smoker   . Smokeless tobacco: Never Used  . Alcohol Use: No   OB History   Grav Para Term Preterm Abortions TAB SAB Ect Mult Living   1 1 1       1      Review of Systems  Constitutional: Negative for fever, chills and diaphoresis.  HENT: Negative for congestion.   Respiratory: Negative for chest tightness and shortness of breath.   Cardiovascular: Negative for chest  pain.  Gastrointestinal: Negative for nausea.  Genitourinary: Negative for dysuria and difficulty urinating.  Musculoskeletal: Positive for arthralgias. Negative for joint swelling and neck stiffness.  Skin: Negative for color change, rash and wound.  Neurological: Negative for weakness, numbness and headaches.  All other systems reviewed and are negative.      Allergies  Review of patient's allergies indicates no known allergies.  Home Medications  No current outpatient prescriptions on file. BP 101/87  Pulse 82  Temp(Src) 98.2 F (36.8 C) (Oral)  Resp 18  Ht 5\' 4"  (1.626 m)  Wt 319 lb (144.697 kg)  BMI 54.73 kg/m2  SpO2 100%  LMP 08/29/2013 Physical Exam  Nursing note and vitals reviewed. Constitutional: She is oriented to person, place, and time. She appears well-developed and well-nourished. No distress.  HENT:  Head: Normocephalic and atraumatic.  Cardiovascular: Normal rate, regular rhythm, normal heart sounds and intact distal pulses.   No murmur heard. Pulmonary/Chest: Effort normal and breath sounds normal. No respiratory distress.  Musculoskeletal: Normal range of motion. She exhibits tenderness.  Left lateral ankle and dorsal foot is mildly ttp, mild STS is present.  ROM is preserved.  DP pulse is brisk,distal sensation intact.  No erythema, abrasion, bruising or bony deformity.  No proximal tenderness.  Compartments soft  Neurological: She is alert and oriented to person, place, and time. She exhibits normal muscle tone. Coordination normal.  Skin: Skin is warm and dry.    ED Course  Procedures (including critical care time) Labs Review Labs Reviewed - No data to display Imaging Review Dg  Ankle Complete Left  09/28/2013   CLINICAL DATA Extremity edema  EXAM LEFT ANKLE COMPLETE - 3+ VIEW  COMPARISON None.  FINDINGS Frontal, oblique, and lateral views were obtained. There is generalized soft tissue swelling. There is no fracture or effusion. Ankle mortise  appears intact. There is a spur arising from the posterior calcaneus.  IMPRESSION Generalized soft tissue edema.  No fracture.  Mortise intact.  SIGNATURE  Electronically Signed   By: Bretta BangWilliam  Woodruff M.D.   On: 09/28/2013 09:35   Dg Foot Complete Left  09/28/2013   CLINICAL DATA Extremity edema  EXAM LEFT FOOT - COMPLETE 3+ VIEW  COMPARISON None.  FINDINGS Frontal, oblique, and lateral views were obtained. There is diffuse soft tissue swelling. There is no fracture or dislocation. Joint spaces appear intact. No erosive change. There is a spur arising from the posterior calcaneus.  IMPRESSION Generalized soft tissue edema. Posterior calcaneal spur. No fracture or dislocation. No appreciable arthropathy.  SIGNATURE  Electronically Signed   By: Bretta BangWilliam  Woodruff M.D.   On: 09/28/2013 09:36     EKG Interpretation None      MDM   Final diagnoses:  Foot pain, left  Calcaneal spur of left foot   Patient is obese.  Does not wear supportive shoes.  Discussed XR findings with patient.  No concerning sx's for DVT , compartment syndrome or cellulitis.  Pt agrees to symptomatic treatment and given podiatry referral.    The patient appears reasonably screened and/or stabilized for discharge and I doubt any other medical condition or other [facility] requiring further screening, evaluation, or treatment in the ED at this time prior to discharge.      Osman Calzadilla L. Trisha Mangleriplett, PA-C 09/29/13 2111

## 2013-09-28 NOTE — ED Notes (Signed)
Patient with no complaints at this time. Respirations even and unlabored. Skin warm/dry. Discharge instructions reviewed with patient at this time. Patient given opportunity to voice concerns/ask questions. Patient discharged at this time and left Emergency Department with steady gait.   

## 2013-10-04 NOTE — ED Provider Notes (Signed)
Medical screening examination/treatment/procedure(s) were performed by non-physician practitioner and as supervising physician I was immediately available for consultation/collaboration.   EKG Interpretation None        Shelda JakesScott W. Delyle Weider, MD 10/04/13 [phone]

## 2013-10-08 ENCOUNTER — Encounter (HOSPITAL_COMMUNITY): Payer: Self-pay | Admitting: Emergency Medicine

## 2013-10-08 ENCOUNTER — Emergency Department (HOSPITAL_COMMUNITY)
Admission: EM | Admit: 2013-10-08 | Discharge: 2013-10-08 | Disposition: A | Discharge status: Home / Self Care | Payer: Medicaid Other | Attending: Emergency Medicine | Admitting: Emergency Medicine

## 2013-10-08 DIAGNOSIS — M25579 Pain in unspecified ankle and joints of unspecified foot: Secondary | ICD-10-CM | POA: Insufficient documentation

## 2013-10-08 DIAGNOSIS — M79672 Pain in left foot: Secondary | ICD-10-CM

## 2013-10-08 DIAGNOSIS — Z791 Long term (current) use of non-steroidal anti-inflammatories (NSAID): Secondary | ICD-10-CM | POA: Insufficient documentation

## 2013-10-08 MED ORDER — DICLOFENAC SODIUM 75 MG PO TBEC: 75.0000 mg | DELAYED_RELEASE_TABLET | Freq: Two times a day (BID) | ORAL | Status: DC

## 2013-10-08 NOTE — ED Notes (Signed)
Patient complaining of left foot pain x 1 week. Recently seen here for same. Denies any known injury.

## 2013-10-10 NOTE — ED Provider Notes (Signed)
CSN: [id number]     Arrival date & time 10/08/13  2123 History   First MD Initiated Contact with Patient 10/08/13 2140     Chief Complaint  Patient presents with  . Foot Pain     (Consider location/radiation/quality/duration/timing/severity/associated sxs/prior Treatment) Patient is a 23 y.o. female presenting with lower extremity pain. The history is provided by the patient.  Foot Pain This is a chronic problem. The current episode started 1 to 4 weeks ago. The problem occurs constantly. The problem has been unchanged. Associated symptoms include arthralgias. Pertinent negatives include no chills, fever, joint swelling, myalgias, neck pain, numbness, rash, swollen glands, vomiting or weakness. The symptoms are aggravated by standing and walking. She has tried NSAIDs for the symptoms. The treatment provided mild relief.   Patient returns to ED with c/o persistent left foot pain for 1-2 weeks.  She was seen here one week ago for same and found to have calcaneal spur.  Patient is morbidly obese and wearing only flip flops.  She was advised to f/u with a podiatrist on previous visit, but patient states she did not because "they don't accept Mediacid".  She reports continued swelling of the foot as well.  She denies fever, chills, calf pain, redness or swelling.  Pain to the foot is worse with weight bearing and resolves at rest.   History reviewed. No pertinent past medical history. History reviewed. No pertinent past surgical history. History reviewed. No pertinent family history. History  Substance Use Topics  . Smoking status: Never Smoker   . Smokeless tobacco: Never Used  . Alcohol Use: No   OB History   Grav Para Term Preterm Abortions TAB SAB Ect Mult Living   1 1 1       1      Review of Systems  Constitutional: Negative for fever and chills.  Gastrointestinal: Negative for vomiting.  Genitourinary: Negative for dysuria and difficulty urinating.  Musculoskeletal: Positive for  arthralgias. Negative for back pain, joint swelling, myalgias and neck pain.  Skin: Negative for color change, rash and wound.  Neurological: Negative for weakness and numbness.  All other systems reviewed and are negative.      Allergies  Review of patient's allergies indicates no known allergies.  Home Medications   Current Outpatient Rx  Name  Route  Sig  Dispense  Refill  . diclofenac (VOLTAREN) 75 MG EC tablet   Oral   Take 1 tablet (75 mg total) by mouth 2 (two) times daily. Take with food   14 tablet   0    BP 136/82  Pulse 95  Temp(Src) 97.8 F (36.6 C) (Oral)  Resp 20  SpO2 98%  LMP 09/27/2013 Physical Exam  Nursing note and vitals reviewed. Constitutional: She is oriented to person, place, and time. She appears well-developed and well-nourished. No distress.  HENT:  Head: Normocephalic and atraumatic.  Cardiovascular: Normal rate, regular rhythm, normal heart sounds and intact distal pulses.   Pulmonary/Chest: Effort normal and breath sounds normal.  Musculoskeletal: She exhibits edema and tenderness.       Left foot: She exhibits tenderness and swelling. She exhibits normal range of motion, no bony tenderness, normal capillary refill, no crepitus, no deformity and no laceration.       Feet:  Moderate edema of the dorsal left foot with ttp of the distal foot.    ROM is preserved.  DP pulse is brisk,distal sensation intact.  No erythema, abrasion, bruising or bony deformity.  No proximal tenderness  or edema  Neurological: She is alert and oriented to person, place, and time. She exhibits normal muscle tone. Coordination normal.  Skin: Skin is warm and dry.    ED Course  Procedures (including critical care time) Labs Review Labs Reviewed - No data to display Imaging Review No results found.   EKG Interpretation None      MDM   Final diagnoses:  Foot pain, left    Pt with hx of persistent left foot pain, moderate obesity and does not wear  supportive shoes.  Foot is NV intact and w/o signs of infection.  No calf pain or edema, no concerning sx's for DVT or cellulitis.  Given another podiatry referral.    Pt is ambulatory and stable for discharge.      Danicka Hourihan L. Trisha Mangle, PA-C 10/10/13 1717

## 2013-10-10 NOTE — ED Provider Notes (Signed)
Medical screening examination/treatment/procedure(s) were performed by non-physician practitioner and as supervising physician I was immediately available for consultation/collaboration.   EKG Interpretation None       Aayushi Solorzano F Hazleigh Mccleave, MD 10/10/13 1946 

## 2013-12-28 ENCOUNTER — Encounter: Payer: Self-pay | Admitting: Adult Health

## 2013-12-28 ENCOUNTER — Ambulatory Visit (INDEPENDENT_AMBULATORY_CARE_PROVIDER_SITE_OTHER): Payer: Medicaid Other | Admitting: Adult Health

## 2013-12-28 VITALS — BP 130/80 | Ht 64.25 in | Wt 331.0 lb

## 2013-12-28 DIAGNOSIS — Z3202 Encounter for pregnancy test, result negative: Secondary | ICD-10-CM

## 2013-12-28 DIAGNOSIS — Z3046 Encounter for surveillance of implantable subdermal contraceptive: Secondary | ICD-10-CM | POA: Insufficient documentation

## 2013-12-28 DIAGNOSIS — Z309 Encounter for contraceptive management, unspecified: Secondary | ICD-10-CM

## 2013-12-28 HISTORY — DX: Encounter for contraceptive management, unspecified: Z30.9

## 2013-12-28 HISTORY — DX: Encounter for surveillance of implantable subdermal contraceptive: Z30.46

## 2013-12-28 LAB — POCT URINE PREGNANCY: Preg Test, Ur: NEGATIVE

## 2013-12-28 MED ORDER — NORETHIN ACE-ETH ESTRAD-FE 1-20 MG-MCG(24) PO CHEW: CHEWABLE_TABLET | ORAL | Status: DC

## 2013-12-28 NOTE — Progress Notes (Signed)
Subjective:     Patient ID: Kristina Scott, female   DOB: 10-Sep-1990, 23 y.o.   MRN: [id number]  HPI Kristina Scott is a 23 year old black female in for nexplanon removal and get on OCs.She denies any migraines, history of heart attack,stroke,DVT or breast cancer.  Review of Systems See HPI Reviewed past medical,surgical, social and family history. Reviewed medications and allergies.     Objective:   Physical Exam BP 130/80  Ht 5' 4.25" (1.632 m)  Wt 331 lb (150.141 kg)  BMI 56.37 kg/m2  LMP 06/01/2015UPT negative, verbal consent obtained,time out called.  Left arm cleansed with betadine, and injected with 1.5 cc 2% lidocaine and waited til numb.Under sterile technique a #11 blade was used to make small vertical incision, and a curved forceps was used to easily remove rod. Steri strips applied. Pressure dressing applied.   Discussed starting OCs today, take 1 daily at same and use condoms.  Assessment:    Nexplanon removal Contraceptive management    Plan:    Rx minastrin disp 1 pack take 1 daily refill x 11 Return in 3 months for pap and physical Use condoms x 4 weeks, keep clean and dry x 24 hours, no heavy lifting, keep steri strips on x 72 hours, Keep pressure dressing on x 24 hours.    Review handout on OC use.

## 2013-12-28 NOTE — Patient Instructions (Signed)
Oral Contraception Use Oral contraceptive pills (OCPs) are medicines taken to prevent pregnancy. OCPs work by preventing the ovaries from releasing eggs. The hormones in OCPs also cause the cervical mucus to thicken, preventing the sperm from entering the uterus. The hormones also cause the uterine lining to become thin, not allowing a fertilized egg to attach to the inside of the uterus. OCPs are highly effective when taken exactly as prescribed. However, OCPs do not prevent sexually transmitted diseases (STDs). Safe sex practices, such as using condoms along with an OCP, can help prevent STDs. Before taking OCPs, you may have a physical exam and Pap test. Your health care provider may also order blood tests if necessary. Your health care provider will make sure you are a good candidate for oral contraception. Discuss with your health care provider the possible side effects of the OCP you may be prescribed. When starting an OCP, it can take 2 to 3 months for the body to adjust to the changes in hormone levels in your body.  HOW TO TAKE ORAL CONTRACEPTIVE PILLS Your health care provider may advise you on how to start taking the first cycle of OCPs. Otherwise, you can:   Start on day 1 of your menstrual period. You will not need any backup contraceptive protection with this start time.   Start on the first Sunday after your menstrual period or the day you get your prescription. In these cases, you will need to use backup contraceptive protection for the first week.   Start the pill at any time of your cycle. If you take the pill within 5 days of the start of your period, you are protected against pregnancy right away. In this case, you will not need a backup form of birth control. If you start at any other time of your menstrual cycle, you will need to use another form of birth control for 7 days. If your OCP is the type called a minipill, it will protect you from pregnancy after taking it for 2 days (48  hours). After you have started taking OCPs:   If you forget to take 1 pill, take it as soon as you remember. Take the next pill at the regular time.   If you miss 2 or more pills, call your health care provider because different pills have different instructions for missed doses. Use backup birth control until your next menstrual period starts.   If you use a 28-day pack that contains inactive pills and you miss 1 of the last 7 pills (pills with no hormones), it will not matter. Throw away the rest of the nonhormone pills and start a new pill pack.  No matter which day you start the OCP, you will always start a new pack on that same day of the week. Have an extra pack of OCPs and a backup contraceptive method available in case you miss some pills or lose your OCP pack.  HOME CARE INSTRUCTIONS   Do not smoke.   Always use a condom to protect against STDs. OCPs do not protect against STDs.   Use a calendar to mark your menstrual period days.   Read the information and directions that came with your OCP. Talk to your health care provider if you have questions.  SEEK MEDICAL CARE IF:   You develop nausea and vomiting.   You have abnormal vaginal discharge or bleeding.   You develop a rash.   You miss your menstrual period.   You are losing   your hair.   You need treatment for mood swings or depression.   You get dizzy when taking the OCP.   You develop acne from taking the OCP.   You become pregnant.  SEEK IMMEDIATE MEDICAL CARE IF:   You develop chest pain.   You develop shortness of breath.   You have an uncontrolled or severe headache.   You develop numbness or slurred speech.   You develop visual problems.   You develop pain, redness, and swelling in the legs.  Document Released: 06/26/2011 Document Revised: 03/09/2013 Document Reviewed: 12/26/2012 [facility] Patient Information 2014 [place], [place]. Use condoms x 4 weeks, keep clean and dry x 24  hours, no heavy lifting, keep steri strips on x 72 hours, Keep pressure dressing on x 24 hours. Start minastrin today, follow up in 3 months for pap and physical.

## 2014-05-22 ENCOUNTER — Encounter: Payer: Self-pay | Admitting: Adult Health

## 2014-07-21 NOTE — L&D Delivery Note (Signed)
Delivery Note At 12:10 PM a healthy female was delivered via SVD OA.  APGAR: 9, 9; weight  7 lb 1.4 oz.   Placenta status: intact, vasa previa. Cord: 3 vessels with the following complications: none.    Anesthesia:  Epidural Episiotomy: None Lacerations:  None Est. Blood Loss (mL):  10  Mom to AICU.  Baby to Couplet care / Skin to Skin.  Tarri Abernethy 03/07/2015, 1:04 PM

## 2014-08-23 ENCOUNTER — Ambulatory Visit (INDEPENDENT_AMBULATORY_CARE_PROVIDER_SITE_OTHER): Payer: Medicaid Other | Admitting: Adult Health

## 2014-08-23 ENCOUNTER — Encounter: Payer: Self-pay | Admitting: Adult Health

## 2014-08-23 ENCOUNTER — Other Ambulatory Visit (HOSPITAL_COMMUNITY)
Admission: RE | Admit: 2014-08-23 | Discharge: 2014-08-23 | Disposition: A | Discharge status: Home / Self Care | Payer: Medicaid Other | Source: Ambulatory Visit | Attending: Adult Health | Admitting: Adult Health

## 2014-08-23 VITALS — BP 110/80 | HR 82 | Ht 65.25 in | Wt 350.6 lb

## 2014-08-23 DIAGNOSIS — Z3202 Encounter for pregnancy test, result negative: Secondary | ICD-10-CM

## 2014-08-23 DIAGNOSIS — Z0000 Encounter for general adult medical examination without abnormal findings: Secondary | ICD-10-CM

## 2014-08-23 DIAGNOSIS — Z331 Pregnant state, incidental: Secondary | ICD-10-CM

## 2014-08-23 DIAGNOSIS — Z01419 Encounter for gynecological examination (general) (routine) without abnormal findings: Secondary | ICD-10-CM

## 2014-08-23 DIAGNOSIS — N926 Irregular menstruation, unspecified: Secondary | ICD-10-CM

## 2014-08-23 DIAGNOSIS — Z113 Encounter for screening for infections with a predominantly sexual mode of transmission: Secondary | ICD-10-CM | POA: Insufficient documentation

## 2014-08-23 HISTORY — DX: Pregnant state, incidental: Z33.1

## 2014-08-23 LAB — POCT URINE PREGNANCY: PREG TEST UR: POSITIVE

## 2014-08-23 MED ORDER — PRENATAL PLUS 27-1 MG PO TABS: 1.0000 | ORAL_TABLET | Freq: Every day | ORAL | Status: DC

## 2014-08-23 NOTE — Progress Notes (Addendum)
Patient ID: Kristina Scott Kristina Scott, female   DOB: 10-30-1990, 24 y.o.   MRN: [id number] History of Present Illness: Kristina Scott is a 24 year old black female, engaged in for pap and physical.She has missed a period, is taking minastrin but has been late taking some.Has +UPT today.Has 25 year old daughter.   Current Medications, Allergies, Past Medical History, Past Surgical History, Family History and Social History were reviewed in [facility] electronic medical record.     Review of Systems: Patient denies any headaches, blurred vision, shortness of breath, chest pain, abdominal pain, problems with bowel movements, urination, or intercourse. No joint pain or mood swings.No nausea or pregnancy symptoms.    Physical Exam:BP 110/80 mmHg  Pulse 82  Ht 5' 5.25" (1.657 m)  Wt 350 lb 9.6 oz (159.031 kg)  BMI 57.92 kg/m2  LMP 12/15/2015UPT + General:  Well developed, well nourished, no acute distress Skin:  Warm and dry Neck:  Midline trachea, normal thyroid Lungs; Clear to auscultation bilaterally Breast:  No dominant palpable mass, retraction, or nipple discharge Cardiovascular: Regular rate and rhythm Abdomen:  Soft, non tender, no hepatosplenomegaly,obese Pelvic:  External genitalia is normal in appearance, no lesions.  The vagina is pink and moist.   The cervix is bulbous,pap with GC/CHL peformed.  Uterus is felt to be normal size, shape, and contour.  No adnexal masses or tenderness noted.But difficult secondary to abdominal girth. Extremities:  No swelling or varicosities noted Psych:  No mood changes,alert and cooperative, seems happy   Impression: Well woman gyn exam with pap Missed period Pregnant with +UPT today    Plan: Stop OCs now, no alcohol  Rx prenatal plus #30 1 daily with 11 refills Return in 1 week for dating US  Physical in 1 year Review handout on first trimester

## 2014-08-23 NOTE — Patient Instructions (Signed)
First Trimester of Pregnancy The first trimester of pregnancy is from week 1 until the end of week 12 (months 1 through 3). A week after a sperm fertilizes an egg, the egg will implant on the wall of the uterus. This embryo will begin to develop into a baby. Genes from you and your partner are forming the baby. The female genes determine whether the baby is a boy or a girl. At 6-8 weeks, the eyes and face are formed, and the heartbeat can be seen on ultrasound. At the end of 12 weeks, all the baby's organs are formed.  Now that you are pregnant, you will want to do everything you can to have a healthy baby. Two of the most important things are to get good prenatal care and to follow your health care provider's instructions. Prenatal care is all the medical care you receive before the baby's birth. This care will help prevent, find, and treat any problems during the pregnancy and childbirth. BODY CHANGES Your body goes through many changes during pregnancy. The changes vary from woman to woman.   You may gain or lose a couple of pounds at first.  You may feel sick to your stomach (nauseous) and throw up (vomit). If the vomiting is uncontrollable, call your health care provider.  You may tire easily.  You may develop headaches that can be relieved by medicines approved by your health care provider.  You may urinate more often. Painful urination may mean you have a bladder infection.  You may develop heartburn as a result of your pregnancy.  You may develop constipation because certain hormones are causing the muscles that push waste through your intestines to slow down.  You may develop hemorrhoids or swollen, bulging veins (varicose veins).  Your breasts may begin to grow larger and become tender. Your nipples may stick out more, and the tissue that surrounds them (areola) may become darker.  Your gums may bleed and may be sensitive to brushing and flossing.  Dark spots or blotches (chloasma,  mask of pregnancy) may develop on your face. This will likely fade after the baby is born.  Your menstrual periods will stop.  You may have a loss of appetite.  You may develop cravings for certain kinds of food.  You may have changes in your emotions from day to day, such as being excited to be pregnant or being concerned that something may go wrong with the pregnancy and baby.  You may have more vivid and strange dreams.  You may have changes in your hair. These can include thickening of your hair, rapid growth, and changes in texture. Some women also have hair loss during or after pregnancy, or hair that feels dry or thin. Your hair will most likely return to normal after your baby is born. WHAT TO EXPECT AT YOUR PRENATAL VISITS During a routine prenatal visit:  You will be weighed to make sure you and the baby are growing normally.  Your blood pressure will be taken.  Your abdomen will be measured to track your baby's growth.  The fetal heartbeat will be listened to starting around week 10 or 12 of your pregnancy.  Test results from any previous visits will be discussed. Your health care provider may ask you:  How you are feeling.  If you are feeling the baby move.  If you have had any abnormal symptoms, such as leaking fluid, bleeding, severe headaches, or abdominal cramping.  If you have any questions. Other tests   that may be performed during your first trimester include:  Blood tests to find your blood type and to check for the presence of any previous infections. They will also be used to check for low iron levels (anemia) and Rh antibodies. Later in the pregnancy, blood tests for diabetes will be done along with other tests if problems develop.  Urine tests to check for infections, diabetes, or protein in the urine.  An ultrasound to confirm the proper growth and development of the baby.  An amniocentesis to check for possible genetic problems.  Fetal screens for  spina bifida and Down syndrome.  You may need other tests to make sure you and the baby are doing well. HOME CARE INSTRUCTIONS  Medicines  Follow your health care provider's instructions regarding medicine use. Specific medicines may be either safe or unsafe to take during pregnancy.  Take your prenatal vitamins as directed.  If you develop constipation, try taking a stool softener if your health care provider approves. Diet  Eat regular, well-balanced meals. Choose a variety of foods, such as meat or vegetable-based protein, fish, milk and low-fat dairy products, vegetables, fruits, and whole grain breads and cereals. Your health care provider will help you determine the amount of weight gain that is right for you.  Avoid raw meat and uncooked cheese. These carry germs that can cause birth defects in the baby.  Eating four or five small meals rather than three large meals a day may help relieve nausea and vomiting. If you start to feel nauseous, eating a few soda crackers can be helpful. Drinking liquids between meals instead of during meals also seems to help nausea and vomiting.  If you develop constipation, eat more high-fiber foods, such as fresh vegetables or fruit and whole grains. Drink enough fluids to keep your urine clear or pale yellow. Activity and Exercise  Exercise only as directed by your health care provider. Exercising will help you:  Control your weight.  Stay in shape.  Be prepared for labor and delivery.  Experiencing pain or cramping in the lower abdomen or low back is a good sign that you should stop exercising. Check with your health care provider before continuing normal exercises.  Try to avoid standing for long periods of time. Move your legs often if you must stand in one place for a long time.  Avoid heavy lifting.  Wear low-heeled shoes, and practice good posture.  You may continue to have sex unless your health care provider directs you  otherwise. Relief of Pain or Discomfort  Wear a good support bra for breast tenderness.   Take warm sitz baths to soothe any pain or discomfort caused by hemorrhoids. Use hemorrhoid cream if your health care provider approves.   Rest with your legs elevated if you have leg cramps or low back pain.  If you develop varicose veins in your legs, wear support hose. Elevate your feet for 15 minutes, 3-4 times a day. Limit salt in your diet. Prenatal Care  Schedule your prenatal visits by the twelfth week of pregnancy. They are usually scheduled monthly at first, then more often in the last 2 months before delivery.  Write down your questions. Take them to your prenatal visits.  Keep all your prenatal visits as directed by your health care provider. Safety  Wear your seat belt at all times when driving.  Make a list of emergency phone numbers, including numbers for family, friends, the hospital, and police and fire departments. General Tips    Ask your health care provider for a referral to a local prenatal education class. Begin classes no later than at the beginning of month 6 of your pregnancy.  Ask for help if you have counseling or nutritional needs during pregnancy. Your health care provider can offer advice or refer you to specialists for help with various needs.  Do not use hot tubs, steam rooms, or saunas.  Do not douche or use tampons or scented sanitary pads.  Do not cross your legs for long periods of time.  Avoid cat litter boxes and soil used by cats. These carry germs that can cause birth defects in the baby and possibly loss of the fetus by miscarriage or stillbirth.  Avoid all smoking, herbs, alcohol, and medicines not prescribed by your health care provider. Chemicals in these affect the formation and growth of the baby.  Schedule a dentist appointment. At home, brush your teeth with a soft toothbrush and be gentle when you floss. SEEK MEDICAL CARE IF:   You have  dizziness.  You have mild pelvic cramps, pelvic pressure, or nagging pain in the abdominal area.  You have persistent nausea, vomiting, or diarrhea.  You have a bad smelling vaginal discharge.  You have pain with urination.  You notice increased swelling in your face, hands, legs, or ankles. SEEK IMMEDIATE MEDICAL CARE IF:   You have a fever.  You are leaking fluid from your vagina.  You have spotting or bleeding from your vagina.  You have severe abdominal cramping or pain.  You have rapid weight gain or loss.  You vomit blood or material that looks like coffee grounds.  You are exposed to German measles and have never had them.  You are exposed to fifth disease or chickenpox.  You develop a severe headache.  You have shortness of breath.  You have any kind of trauma, such as from a fall or a car accident. Document Released: 07/01/2001 Document Revised: 11/21/2013 Document Reviewed: 05/17/2013 [employer] Patient Information 2015 [employer]. This information is not intended to replace advice given to you by your health care provider. Make sure you discuss any questions you have with your health care provider. Return in 1 week for dating US 

## 2014-08-24 LAB — CYTOLOGY - PAP

## 2014-08-28 ENCOUNTER — Ambulatory Visit (INDEPENDENT_AMBULATORY_CARE_PROVIDER_SITE_OTHER): Payer: Medicaid Other

## 2014-08-28 ENCOUNTER — Other Ambulatory Visit: Payer: Self-pay | Admitting: Adult Health

## 2014-08-28 ENCOUNTER — Encounter: Payer: Self-pay | Admitting: Adult Health

## 2014-08-28 DIAGNOSIS — Z331 Pregnant state, incidental: Secondary | ICD-10-CM

## 2014-08-28 DIAGNOSIS — O26841 Uterine size-date discrepancy, first trimester: Secondary | ICD-10-CM

## 2014-08-28 NOTE — Progress Notes (Signed)
U/S-single IUP with +FCA noted, FHr- 160 bpm, CRL c/w 10 wks, EDD 03/26/2015, cx appears closed, bilateral adnexa appears WNL

## 2014-08-31 ENCOUNTER — Emergency Department (HOSPITAL_COMMUNITY): Payer: Medicaid Other

## 2014-08-31 ENCOUNTER — Emergency Department (HOSPITAL_COMMUNITY)
Admission: EM | Admit: 2014-08-31 | Discharge: 2014-09-01 | Disposition: A | Discharge status: Home / Self Care | Payer: Medicaid Other | Attending: Emergency Medicine | Admitting: Emergency Medicine

## 2014-08-31 DIAGNOSIS — Y9289 Other specified places as the place of occurrence of the external cause: Secondary | ICD-10-CM | POA: Insufficient documentation

## 2014-08-31 DIAGNOSIS — Y9389 Activity, other specified: Secondary | ICD-10-CM | POA: Insufficient documentation

## 2014-08-31 DIAGNOSIS — W228XXA Striking against or struck by other objects, initial encounter: Secondary | ICD-10-CM | POA: Insufficient documentation

## 2014-08-31 DIAGNOSIS — S8992XA Unspecified injury of left lower leg, initial encounter: Secondary | ICD-10-CM | POA: Diagnosis present

## 2014-08-31 DIAGNOSIS — Z87891 Personal history of nicotine dependence: Secondary | ICD-10-CM | POA: Insufficient documentation

## 2014-08-31 DIAGNOSIS — Y998 Other external cause status: Secondary | ICD-10-CM | POA: Insufficient documentation

## 2014-08-31 DIAGNOSIS — E669 Obesity, unspecified: Secondary | ICD-10-CM | POA: Diagnosis not present

## 2014-08-31 DIAGNOSIS — S8002XA Contusion of left knee, initial encounter: Secondary | ICD-10-CM | POA: Insufficient documentation

## 2014-08-31 NOTE — ED Notes (Signed)
Patient reports left knee pain x 1 week. States hurts to bend knee

## 2014-09-01 MED ORDER — ACETAMINOPHEN 500 MG PO TABS
1000.0000 mg | ORAL_TABLET | Freq: Once | ORAL | Status: AC
  Administered 2014-09-01: 1000 mg via ORAL
  Filled 2014-09-01: qty 2

## 2014-09-01 NOTE — ED Provider Notes (Signed)
CSN: [id number]     Arrival date & time 08/31/14  2220 History   First MD Initiated Contact with Patient 08/31/14 2259     Chief Complaint  Patient presents with  . Knee Pain     (Consider location/radiation/quality/duration/timing/severity/associated sxs/prior Treatment) Patient is a 24 y.o. female presenting with knee pain. The history is provided by the patient.  Knee Pain Location:  Knee Time since incident:  3 days Injury: yes (she struck her knee against a sink at work possibly triggering the pain.  denies falls)   Knee location:  L knee Pain details:    Quality:  Aching   Radiates to:  Does not radiate   Severity:  Moderate   Onset quality:  Gradual   Timing:  Constant   Progression:  Worsening Chronicity:  New Dislocation: no   Foreign body present:  No foreign bodies Prior injury to area:  No Relieved by:  Rest Worsened by:  Flexion Ineffective treatments:  None tried Associated symptoms: decreased ROM   Associated symptoms: no back pain, no fever, no numbness, no stiffness, no swelling and no tingling   Risk factors: obesity     Past Medical History  Diagnosis Date  . Encounter for Nexplanon removal 12/28/2013  . Contraceptive management 12/28/2013  . Obesity   . Pregnant state, incidental 08/23/2014   Past Surgical History  Procedure Laterality Date  . Wisdom tooth extraction     No family history on file. History  Substance Use Topics  . Smoking status: Former Smoker    Types: Cigarettes  . Smokeless tobacco: Never Used  . Alcohol Use: No   OB History    Gravida Para Term Preterm AB TAB SAB Ectopic Multiple Living   2 1 1       1      Review of Systems  Constitutional: Negative for fever.  Musculoskeletal: Positive for arthralgias. Negative for myalgias, back pain, joint swelling and stiffness.  Neurological: Negative for weakness and numbness.      Allergies  Review of patient's allergies indicates no known allergies.  Home Medications    Prior to Admission medications   Medication Sig Start Date End Date Taking? Authorizing Provider  prenatal vitamin w/FE, FA (PRENATAL 1 + 1) 27-1 MG TABS tablet Take 1 tablet by mouth daily at 12 noon. 08/23/14   Adline PotterJennifer A Griffin, NP   BP 147/99 mmHg  Pulse 94  Temp(Src) 98.6 F (37 C) (Oral)  Resp 19  Ht 5\' 5"  (1.651 m)  Wt 352 lb (159.666 kg)  BMI 58.58 kg/m2  SpO2 100%  LMP 07/04/2014 (Approximate) Physical Exam  Constitutional: She appears well-developed and well-nourished.  HENT:  Head: Atraumatic.  Neck: Normal range of motion.  Cardiovascular:  Pulses:      Dorsalis pedis pulses are 2+ on the right side, and 2+ on the left side.  Pulses equal bilaterally  Musculoskeletal: She exhibits tenderness.       Left knee: She exhibits bony tenderness. She exhibits no swelling, no effusion, no deformity, no erythema, no LCL laxity, normal patellar mobility and no MCL laxity.  ttp at left greater tibial tuberosity.  Patellar tendon intact.  Pt can SLR without knee weakness. No ligament instability.  Neurological: She is alert. She has normal strength. She displays normal reflexes. No sensory deficit.  Skin: Skin is warm and dry.  Psychiatric: She has a normal mood and affect.    ED Course  Procedures (including critical care time) Labs Review Labs Reviewed -  No data to display  Imaging Review Dg Knee Complete 4 Views Left  09/01/2014   CLINICAL DATA:  Left knee pain for 1 week.  No specific injury.  EXAM: LEFT KNEE - COMPLETE 4+ VIEW  COMPARISON:  None.  FINDINGS: There is no evidence of fracture, dislocation, or joint effusion. There is no evidence of arthropathy or other focal bone abnormality. Soft tissues are unremarkable.  IMPRESSION: Negative.   Electronically Signed   By: Burman Nieves M.D.   On: 09/01/2014 00:22     EKG Interpretation None      MDM   Final diagnoses:  Knee contusion, left, initial encounter    Patients labs and/or radiological studies  were viewed and considered during the medical decision making and disposition process. Ice,  Heat tx, tylenol, rest.  F/u with ortho if sx not improved.      Burgess Amor, PA-C 09/01/14 0104  Geoffery Lyons, MD 09/01/14 [phone]

## 2014-09-01 NOTE — Discharge Instructions (Signed)
Contusion A contusion is a deep bruise. Contusions are the result of an injury that caused bleeding under the skin. The contusion may turn blue, purple, or yellow. Minor injuries will give you a painless contusion, but more severe contusions may stay painful and swollen for a few weeks.  CAUSES  A contusion is usually caused by a blow, trauma, or direct force to an area of the body. SYMPTOMS   Swelling and redness of the injured area.  Bruising of the injured area.  Tenderness and soreness of the injured area.  Pain. DIAGNOSIS  The diagnosis can be made by taking a history and physical exam. An X-ray, CT scan, or MRI may be needed to determine if there were any associated injuries, such as fractures. TREATMENT  Specific treatment will depend on what area of the body was injured. In general, the best treatment for a contusion is resting, icing, elevating, and applying cold compresses to the injured area. Over-the-counter medicines may also be recommended for pain control. Ask your caregiver what the best treatment is for your contusion. HOME CARE INSTRUCTIONS   Put ice on the injured area.  Put ice in a plastic bag.  Place a towel between your skin and the bag.  Leave the ice on for 15-20 minutes, 3-4 times a day, or as directed by your health care provider.  You may also apply heat on your knee - 20 minutes several times daily.  Only take over-the-counter or prescription medicines for pain, discomfort, or fever as directed by your caregiver. Your caregiver may recommend avoiding anti-inflammatory medicines (aspirin, ibuprofen, and naproxen) for 48 hours because these medicines may increase bruising.  Rest the injured area.  If possible, elevate the injured area to reduce swelling. SEEK IMMEDIATE MEDICAL CARE IF:   You have increased bruising or swelling.  You have pain that is getting worse.  Your swelling or pain is not relieved with medicines. MAKE SURE YOU:   Understand  these instructions.  Will watch your condition.  Will get help right away if you are not doing well or get worse. Document Released: 04/16/2005 Document Revised: 07/12/2013 Document Reviewed: 05/12/2011 [facility] Patient Information 2015 [place], [place]. This information is not intended to replace advice given to you by your health care provider. Make sure you discuss any questions you have with your health care provider.  Tylenol is the only safe medicine to take for pain while pregnant.

## 2014-09-05 ENCOUNTER — Encounter: Payer: Medicaid Other | Admitting: Women's Health

## 2014-09-11 ENCOUNTER — Ambulatory Visit (INDEPENDENT_AMBULATORY_CARE_PROVIDER_SITE_OTHER): Payer: Medicaid Other | Admitting: Women's Health

## 2014-09-11 ENCOUNTER — Encounter: Payer: Self-pay | Admitting: Women's Health

## 2014-09-11 VITALS — BP 118/76 | Wt 347.0 lb

## 2014-09-11 DIAGNOSIS — Z0283 Encounter for blood-alcohol and blood-drug test: Secondary | ICD-10-CM

## 2014-09-11 DIAGNOSIS — Z23 Encounter for immunization: Secondary | ICD-10-CM

## 2014-09-11 DIAGNOSIS — Z130 Encounter for screening for diseases of the blood and blood-forming organs and certain disorders involving the immune mechanism: Secondary | ICD-10-CM

## 2014-09-11 DIAGNOSIS — Z3491 Encounter for supervision of normal pregnancy, unspecified, first trimester: Secondary | ICD-10-CM

## 2014-09-11 DIAGNOSIS — Z3682 Encounter for antenatal screening for nuchal translucency: Secondary | ICD-10-CM

## 2014-09-11 DIAGNOSIS — Z3A12 12 weeks gestation of pregnancy: Secondary | ICD-10-CM

## 2014-09-11 DIAGNOSIS — Z114 Encounter for screening for human immunodeficiency virus [HIV]: Secondary | ICD-10-CM

## 2014-09-11 DIAGNOSIS — Z118 Encounter for screening for other infectious and parasitic diseases: Secondary | ICD-10-CM

## 2014-09-11 DIAGNOSIS — O09899 Supervision of other high risk pregnancies, unspecified trimester: Secondary | ICD-10-CM | POA: Insufficient documentation

## 2014-09-11 DIAGNOSIS — Z331 Pregnant state, incidental: Secondary | ICD-10-CM

## 2014-09-11 DIAGNOSIS — O99211 Obesity complicating pregnancy, first trimester: Secondary | ICD-10-CM

## 2014-09-11 DIAGNOSIS — Z1389 Encounter for screening for other disorder: Secondary | ICD-10-CM

## 2014-09-11 DIAGNOSIS — Z3481 Encounter for supervision of other normal pregnancy, first trimester: Secondary | ICD-10-CM

## 2014-09-11 DIAGNOSIS — Z1371 Encounter for nonprocreative screening for genetic disease carrier status: Secondary | ICD-10-CM

## 2014-09-11 DIAGNOSIS — Z0184 Encounter for antibody response examination: Secondary | ICD-10-CM

## 2014-09-11 DIAGNOSIS — Z113 Encounter for screening for infections with a predominantly sexual mode of transmission: Secondary | ICD-10-CM

## 2014-09-11 DIAGNOSIS — Z1159 Encounter for screening for other viral diseases: Secondary | ICD-10-CM

## 2014-09-11 LAB — POCT URINALYSIS DIPSTICK
Blood, UA: NEGATIVE
Glucose, UA: NEGATIVE
KETONES UA: NEGATIVE
Leukocytes, UA: NEGATIVE
NITRITE UA: NEGATIVE

## 2014-09-11 LAB — OB RESULTS CONSOLE RUBELLA ANTIBODY, IGM: RUBELLA: NON-IMMUNE/NOT IMMUNE

## 2014-09-11 NOTE — Patient Instructions (Signed)

## 2014-09-11 NOTE — Progress Notes (Signed)
  Subjective:  Kristina Scott is a 24 y.o. 442P1001 African American female at 8257w0d by 10wk u/s being seen today for her first obstetrical visit.  Her obstetrical history is significant for obesity, term uncomplicated svb x 1.  Pregnancy history fully reviewed.  Patient reports no complaints. Denies vb, cramping, uti s/s, abnormal/malodorous vag d/c, or vulvovaginal itching/irritation.  BP 118/76 mmHg  Wt 347 lb (157.398 kg)  LMP 07/04/2014 (Approximate)  HISTORY: OB History  Gravida Para Term Preterm AB SAB TAB Ectopic Multiple Living  2 1 1       1     # Outcome Date GA Lbr Len/2nd Weight Sex Delivery Anes PTL Lv  2 Current           1 Term 11/29/06 2618w4d   F Vag-Spont  N Y     Past Medical History  Diagnosis Date  . Encounter for Nexplanon removal 12/28/2013  . Contraceptive management 12/28/2013  . Obesity   . Pregnant state, incidental 08/23/2014  . Medical history non-contributory    Past Surgical History  Procedure Laterality Date  . Wisdom tooth extraction     Family History  Problem Relation Age of Onset  . Stroke Maternal Grandfather     Exam   System:     General: Well developed & nourished, no acute distress   Skin: Warm & dry, normal coloration and turgor, no rashes   Neurologic: Alert & oriented, normal mood   Cardiovascular: Regular rate & rhythm   Respiratory: Effort & rate normal, LCTAB, acyanotic   Abdomen: Soft, non tender   Extremities: normal strength, tone  Thin prep pap smear neg 08/2014  FHR: 150s via informal u/s, unable to find w/ doppler d/t maternal body habitus   Assessment:   Pregnancy: G2P1001 Patient Active Problem List   Diagnosis Date Noted  . Supervision of normal pregnancy 09/11/2014    Priority: High  . Pregnant state, incidental 08/23/2014  . Encounter for Nexplanon removal 12/28/2013    7557w0d G2P1001 New OB visit   Plan:  Initial labs drawn Continue prenatal vitamins Problem list reviewed and updated Reviewed  n/v relief measures and warning s/s to report Reviewed recommended weight gain based on pre-gravid BMI Encouraged well-balanced diet Genetic Screening discussed Integrated Screen: requested Cystic fibrosis screening discussed requested Ultrasound discussed; fetal survey: requested Follow up in 1 weeks for 1st it/nt (no visit), then 4wks for 2nd IT and visit [facility] completed  Marge DuncansBooker, Serinity Ware Randall CNM, [facility] HospitalWHNP-BC 09/11/2014 3:22 PM

## 2014-09-12 ENCOUNTER — Telehealth: Payer: Self-pay | Admitting: *Deleted

## 2014-09-12 ENCOUNTER — Other Ambulatory Visit: Payer: Self-pay | Admitting: Women's Health

## 2014-09-12 ENCOUNTER — Encounter: Payer: Self-pay | Admitting: Women's Health

## 2014-09-12 DIAGNOSIS — O9989 Other specified diseases and conditions complicating pregnancy, childbirth and the puerperium: Secondary | ICD-10-CM

## 2014-09-12 DIAGNOSIS — Z283 Underimmunization status: Secondary | ICD-10-CM | POA: Insufficient documentation

## 2014-09-12 DIAGNOSIS — Z2839 Other underimmunization status: Secondary | ICD-10-CM | POA: Insufficient documentation

## 2014-09-12 DIAGNOSIS — O99019 Anemia complicating pregnancy, unspecified trimester: Secondary | ICD-10-CM | POA: Insufficient documentation

## 2014-09-12 MED ORDER — FERROUS SULFATE 325 (65 FE) MG PO TABS: 325.0000 mg | ORAL_TABLET | Freq: Two times a day (BID) | ORAL | Status: DC

## 2014-09-12 NOTE — Telephone Encounter (Signed)
Pt informed of anemia and states she has already picked up med from pharmacy, she was instructed to take it along with PNV and increase iron in diet.  Pt verbalized understanding.

## 2014-09-13 LAB — GC/CHLAMYDIA PROBE AMP
CHLAMYDIA, DNA PROBE: NEGATIVE
Neisseria gonorrhoeae by PCR: NEGATIVE

## 2014-09-13 LAB — URINE CULTURE: ORGANISM ID, BACTERIA: NO GROWTH

## 2014-09-15 LAB — PMP SCREEN PROFILE (10S), URINE
Amphetamine Screen, Ur: NEGATIVE ng/mL
Barbiturate Screen, Ur: NEGATIVE ng/mL
Benzodiazepine Screen, Urine: NEGATIVE ng/mL
COCAINE(METAB.) SCREEN, URINE: NEGATIVE ng/mL
Cannabinoids Ur Ql Scn: NEGATIVE ng/mL
Creatinine(Crt), U: 129.8 mg/dL (ref 20.0–300.0)
METHADONE SCREEN, URINE: NEGATIVE ng/mL
OPIATE SCRN UR: NEGATIVE ng/mL
Oxycodone+Oxymorphone Ur Ql Scn: NEGATIVE ng/mL
PCP Scrn, Ur: NEGATIVE ng/mL
PROPOXYPHENE SCREEN: NEGATIVE ng/mL
Ph of Urine: 5.9 (ref 4.5–8.9)

## 2014-09-15 LAB — VARICELLA ZOSTER ANTIBODY, IGG: VARICELLA: 593 {index} (ref >165)

## 2014-09-15 LAB — HIV ANTIBODY (ROUTINE TESTING W REFLEX): HIV SCREEN 4TH GENERATION: NONREACTIVE

## 2014-09-15 LAB — ABO/RH: RH TYPE: POSITIVE

## 2014-09-15 LAB — URINALYSIS, ROUTINE W REFLEX MICROSCOPIC
BILIRUBIN UA: NEGATIVE
GLUCOSE, UA: NEGATIVE
Ketones, UA: NEGATIVE
NITRITE UA: NEGATIVE
PH UA: 6.5 (ref 5.0–7.5)
Protein, UA: NEGATIVE
RBC, UA: NEGATIVE
SPEC GRAV UA: 1.021 (ref 1.005–1.030)
Urobilinogen, Ur: 1 mg/dL (ref 0.2–1.0)

## 2014-09-15 LAB — CBC
HCT: 33 % — ABNORMAL LOW (ref 34.0–46.6)
Hemoglobin: 10.9 g/dL — ABNORMAL LOW (ref 11.1–15.9)
MCH: 28.5 pg (ref 26.6–33.0)
MCHC: 33 g/dL (ref 31.5–35.7)
MCV: 86 fL (ref 79–97)
Platelets: 337 10*3/uL (ref 150–379)
RBC: 3.82 x10E6/uL (ref 3.77–5.28)
RDW: 14.8 % (ref 12.3–15.4)
WBC: 9.6 10*3/uL (ref 3.4–10.8)

## 2014-09-15 LAB — RPR: RPR: NONREACTIVE

## 2014-09-15 LAB — CYSTIC FIBROSIS MUTATION 97: Interpretation: NOT DETECTED

## 2014-09-15 LAB — MICROSCOPIC EXAMINATION: CASTS: NONE SEEN /LPF

## 2014-09-15 LAB — RUBELLA SCREEN

## 2014-09-15 LAB — HEPATITIS B SURFACE ANTIGEN: Hepatitis B Surface Ag: NEGATIVE

## 2014-09-15 LAB — OB RESULTS CONSOLE ABO/RH: RH Type: POSITIVE

## 2014-09-15 LAB — ANTIBODY SCREEN: Antibody Screen: NEGATIVE

## 2014-09-15 LAB — SICKLE CELL SCREEN: SICKLE CELL SCREEN: NEGATIVE

## 2014-09-18 ENCOUNTER — Ambulatory Visit (INDEPENDENT_AMBULATORY_CARE_PROVIDER_SITE_OTHER): Payer: Medicaid Other

## 2014-09-18 ENCOUNTER — Other Ambulatory Visit: Payer: Medicaid Other

## 2014-09-18 DIAGNOSIS — Z3481 Encounter for supervision of other normal pregnancy, first trimester: Secondary | ICD-10-CM

## 2014-09-18 DIAGNOSIS — Z36 Encounter for antenatal screening of mother: Secondary | ICD-10-CM | POA: Diagnosis not present

## 2014-09-18 DIAGNOSIS — Z3682 Encounter for antenatal screening for nuchal translucency: Secondary | ICD-10-CM

## 2014-09-18 NOTE — Progress Notes (Signed)
U/S(13+0wks)-single IUP with +FCA noted,FHR-155 bpm, cx appears closed, bilateral adnexa appears WNL, posterior Gr 0 placenta, NB present, NT-1.4663mm

## 2014-09-20 LAB — MATERNAL SCREEN, INTEGRATED #1
CROWN RUMP LENGTH MAT SCREEN: 70.8 mm
Gest. Age on Collection Date: 13 weeks
MATERNAL AGE AT EDD: 24.9 a
NUCHAL TRANSLUCENCY (NT): 1.6 mm
NUMBER OF FETUSES: 1
PAPP-A Value: 181.8 ng/mL
Weight: 344 [lb_av]

## 2014-10-09 ENCOUNTER — Ambulatory Visit (INDEPENDENT_AMBULATORY_CARE_PROVIDER_SITE_OTHER): Payer: Medicaid Other | Admitting: Women's Health

## 2014-10-09 VITALS — BP 110/84 | HR 82 | Wt 345.5 lb

## 2014-10-09 DIAGNOSIS — Z1389 Encounter for screening for other disorder: Secondary | ICD-10-CM

## 2014-10-09 DIAGNOSIS — Z363 Encounter for antenatal screening for malformations: Secondary | ICD-10-CM

## 2014-10-09 DIAGNOSIS — Z3A16 16 weeks gestation of pregnancy: Secondary | ICD-10-CM

## 2014-10-09 DIAGNOSIS — Z3492 Encounter for supervision of normal pregnancy, unspecified, second trimester: Secondary | ICD-10-CM

## 2014-10-09 DIAGNOSIS — Z331 Pregnant state, incidental: Secondary | ICD-10-CM | POA: Diagnosis not present

## 2014-10-09 DIAGNOSIS — O99212 Obesity complicating pregnancy, second trimester: Secondary | ICD-10-CM

## 2014-10-09 DIAGNOSIS — Z369 Encounter for antenatal screening, unspecified: Secondary | ICD-10-CM

## 2014-10-09 DIAGNOSIS — O99210 Obesity complicating pregnancy, unspecified trimester: Secondary | ICD-10-CM | POA: Insufficient documentation

## 2014-10-09 LAB — POCT URINALYSIS DIPSTICK
Glucose, UA: NEGATIVE
Ketones, UA: NEGATIVE
Leukocytes, UA: NEGATIVE
NITRITE UA: NEGATIVE
Protein, UA: NEGATIVE
RBC UA: NEGATIVE

## 2014-10-09 NOTE — Progress Notes (Signed)
Low-risk OB appointment G2P1001 352w0d Estimated Date of Delivery: 03/26/15 BP 110/84 mmHg  Wt 345 lb 8 oz (156.718 kg)  LMP 07/04/2014 (Approximate)  BP, weight, and urine reviewed.  Refer to obstetrical flow sheet for FH & FHR.  No fm yet. Denies cramping, lof, vb, or uti s/s. No complaints. Reviewed ptl s/s. Plan:  Continue routine obstetrical care  F/U in 4wks for OB appointment and anatomy u/s 2nd IT today

## 2014-10-09 NOTE — Patient Instructions (Signed)
Second Trimester of Pregnancy The second trimester is from week 13 through week 28, months 4 through 6. The second trimester is often a time when you feel your best. Your body has also adjusted to being pregnant, and you begin to feel better physically. Usually, morning sickness has lessened or quit completely, you may have more energy, and you may have an increase in appetite. The second trimester is also a time when the fetus is growing rapidly. At the end of the sixth month, the fetus is about 9 inches long and weighs about 1 pounds. You will likely begin to feel the baby move (quickening) between 18 and 20 weeks of the pregnancy. BODY CHANGES Your body goes through many changes during pregnancy. The changes vary from woman to woman.   Your weight will continue to increase. You will notice your lower abdomen bulging out.  You may begin to get stretch marks on your hips, abdomen, and breasts.  You may develop headaches that can be relieved by medicines approved by your health care provider.  You may urinate more often because the fetus is pressing on your bladder.  You may develop or continue to have heartburn as a result of your pregnancy.  You may develop constipation because certain hormones are causing the muscles that push waste through your intestines to slow down.  You may develop hemorrhoids or swollen, bulging veins (varicose veins).  You may have back pain because of the weight gain and pregnancy hormones relaxing your joints between the bones in your pelvis and as a result of a shift in weight and the muscles that support your balance.  Your breasts will continue to grow and be tender.  Your gums may bleed and may be sensitive to brushing and flossing.  Dark spots or blotches (chloasma, mask of pregnancy) may develop on your face. This will likely fade after the baby is born.  A dark line from your belly button to the pubic area (linea nigra) may appear. This will likely fade  after the baby is born.  You may have changes in your hair. These can include thickening of your hair, rapid growth, and changes in texture. Some women also have hair loss during or after pregnancy, or hair that feels dry or thin. Your hair will most likely return to normal after your baby is born. WHAT TO EXPECT AT YOUR PRENATAL VISITS During a routine prenatal visit:  You will be weighed to make sure you and the fetus are growing normally.  Your blood pressure will be taken.  Your abdomen will be measured to track your baby's growth.  The fetal heartbeat will be listened to.  Any test results from the previous visit will be discussed. Your health care provider may ask you:  How you are feeling.  If you are feeling the baby move.  If you have had any abnormal symptoms, such as leaking fluid, bleeding, severe headaches, or abdominal cramping.  If you have any questions. Other tests that may be performed during your second trimester include:  Blood tests that check for:  Low iron levels (anemia).  Gestational diabetes (between 24 and 28 weeks).  Rh antibodies.  Urine tests to check for infections, diabetes, or protein in the urine.  An ultrasound to confirm the proper growth and development of the baby.  An amniocentesis to check for possible genetic problems.  Fetal screens for spina bifida and Down syndrome. HOME CARE INSTRUCTIONS   Avoid all smoking, herbs, alcohol, and unprescribed   drugs. These chemicals affect the formation and growth of the baby.  Follow your health care provider's instructions regarding medicine use. There are medicines that are either safe or unsafe to take during pregnancy.  Exercise only as directed by your health care provider. Experiencing uterine cramps is a good sign to stop exercising.  Continue to eat regular, healthy meals.  Wear a good support bra for breast tenderness.  Do not use hot tubs, steam rooms, or saunas.  Wear your  seat belt at all times when driving.  Avoid raw meat, uncooked cheese, cat litter boxes, and soil used by cats. These carry germs that can cause birth defects in the baby.  Take your prenatal vitamins.  Try taking a stool softener (if your health care provider approves) if you develop constipation. Eat more high-fiber foods, such as fresh vegetables or fruit and whole grains. Drink plenty of fluids to keep your urine clear or pale yellow.  Take warm sitz baths to soothe any pain or discomfort caused by hemorrhoids. Use hemorrhoid cream if your health care provider approves.  If you develop varicose veins, wear support hose. Elevate your feet for 15 minutes, 3-4 times a day. Limit salt in your diet.  Avoid heavy lifting, wear low heel shoes, and practice good posture.  Rest with your legs elevated if you have leg cramps or low back pain.  Visit your dentist if you have not gone yet during your pregnancy. Use a soft toothbrush to brush your teeth and be gentle when you floss.  A sexual relationship may be continued unless your health care provider directs you otherwise.  Continue to go to all your prenatal visits as directed by your health care provider. SEEK MEDICAL CARE IF:   You have dizziness.  You have mild pelvic cramps, pelvic pressure, or nagging pain in the abdominal area.  You have persistent nausea, vomiting, or diarrhea.  You have a bad smelling vaginal discharge.  You have pain with urination. SEEK IMMEDIATE MEDICAL CARE IF:   You have a fever.  You are leaking fluid from your vagina.  You have spotting or bleeding from your vagina.  You have severe abdominal cramping or pain.  You have rapid weight gain or loss.  You have shortness of breath with chest pain.  You notice sudden or extreme swelling of your face, hands, ankles, feet, or legs.  You have not felt your baby move in over an hour.  You have severe headaches that do not go away with  medicine.  You have vision changes. Document Released: 07/01/2001 Document Revised: 07/12/2013 Document Reviewed: 09/07/2012 [employer] Patient Information 2015 [employer]. This information is not intended to replace advice given to you by your health care provider. Make sure you discuss any questions you have with your health care provider.  

## 2014-10-11 ENCOUNTER — Encounter: Payer: Self-pay | Admitting: Women's Health

## 2014-10-11 ENCOUNTER — Telehealth: Payer: Self-pay | Admitting: Women's Health

## 2014-10-11 DIAGNOSIS — O285 Abnormal chromosomal and genetic finding on antenatal screening of mother: Secondary | ICD-10-CM | POA: Insufficient documentation

## 2014-10-11 LAB — MATERNAL SCREEN, INTEGRATED #2
ADSF: 1
AFP MoM: 3.32
Alpha-Fetoprotein: 49 ng/mL
Crown Rump Length: 70.8 mm
DIA MoM: 1.73
DIA VALUE: 206.6 pg/mL
ESTRIOL UNCONJUGATED: 0.61 ng/mL
Gest. Age on Collection Date: 13 weeks
Gestational Age: 16 weeks
HCG VALUE: 30.4 [IU]/mL
MATERNAL AGE AT EDD: 24.9 a
NUMBER OF FETUSES: 1
Nuchal Translucency (NT): 1.6 mm
Nuchal Translucency MoM: 1.04
PAPP-A MoM: 0.45
PAPP-A VALUE: 181.8 ng/mL
Test Results:: POSITIVE — AB
Weight: 344 [lb_av]
Weight: 345 [lb_av]
hCG MoM: 1.85

## 2014-10-11 NOTE — Telephone Encounter (Signed)
Notified pt of 1:93 r/f OSB. Discussed options of detailed anatomy u/s, vs genetic counseling and amniocentesis. Pt prefers just to do u/s. Questions answered.  Cheral MarkerKimberly R. Lachina Salsberry, CNM, WHNP-BC 10/11/2014 1:12 PM

## 2014-11-06 ENCOUNTER — Ambulatory Visit (INDEPENDENT_AMBULATORY_CARE_PROVIDER_SITE_OTHER): Payer: Medicaid Other | Admitting: Women's Health

## 2014-11-06 ENCOUNTER — Encounter: Payer: Self-pay | Admitting: Women's Health

## 2014-11-06 ENCOUNTER — Other Ambulatory Visit: Payer: Self-pay | Admitting: Women's Health

## 2014-11-06 ENCOUNTER — Other Ambulatory Visit: Payer: Medicaid Other

## 2014-11-06 ENCOUNTER — Ambulatory Visit (INDEPENDENT_AMBULATORY_CARE_PROVIDER_SITE_OTHER): Payer: Medicaid Other

## 2014-11-06 VITALS — BP 124/76 | HR 80 | Wt 345.0 lb

## 2014-11-06 DIAGNOSIS — Z3492 Encounter for supervision of normal pregnancy, unspecified, second trimester: Secondary | ICD-10-CM | POA: Diagnosis not present

## 2014-11-06 DIAGNOSIS — Z36 Encounter for antenatal screening of mother: Secondary | ICD-10-CM

## 2014-11-06 DIAGNOSIS — Z283 Underimmunization status: Secondary | ICD-10-CM

## 2014-11-06 DIAGNOSIS — Z331 Pregnant state, incidental: Secondary | ICD-10-CM | POA: Diagnosis not present

## 2014-11-06 DIAGNOSIS — O9989 Other specified diseases and conditions complicating pregnancy, childbirth and the puerperium: Principal | ICD-10-CM

## 2014-11-06 DIAGNOSIS — O99212 Obesity complicating pregnancy, second trimester: Secondary | ICD-10-CM

## 2014-11-06 DIAGNOSIS — Z3A20 20 weeks gestation of pregnancy: Secondary | ICD-10-CM

## 2014-11-06 DIAGNOSIS — Z1389 Encounter for screening for other disorder: Secondary | ICD-10-CM

## 2014-11-06 DIAGNOSIS — Z363 Encounter for antenatal screening for malformations: Secondary | ICD-10-CM

## 2014-11-06 DIAGNOSIS — O285 Abnormal chromosomal and genetic finding on antenatal screening of mother: Secondary | ICD-10-CM

## 2014-11-06 DIAGNOSIS — Z2839 Other underimmunization status: Secondary | ICD-10-CM

## 2014-11-06 DIAGNOSIS — O99012 Anemia complicating pregnancy, second trimester: Secondary | ICD-10-CM

## 2014-11-06 LAB — POCT URINALYSIS DIPSTICK
Blood, UA: NEGATIVE
GLUCOSE UA: NEGATIVE
Ketones, UA: NEGATIVE
NITRITE UA: NEGATIVE
Protein, UA: NEGATIVE

## 2014-11-06 NOTE — Patient Instructions (Addendum)
Circumcision: $507 at hospital, $244 at [facility], has to be paid up front before it is done. If you want the circumcision done at [facility] you can make payments during pregnancy. If you are interested in this, see receptionist at check-out.  If your baby is older than 28 days when you have the circumcision done at [facility], the fee will go up to $325.50.    Second Trimester of Pregnancy The second trimester is from week 13 through week 28, months 4 through 6. The second trimester is often a time when you feel your best. Your body has also adjusted to being pregnant, and you begin to feel better physically. Usually, morning sickness has lessened or quit completely, you may have more energy, and you may have an increase in appetite. The second trimester is also a time when the fetus is growing rapidly. At the end of the sixth month, the fetus is about 9 inches long and weighs about 1 pounds. You will likely begin to feel the baby move (quickening) between 18 and 20 weeks of the pregnancy. BODY CHANGES Your body goes through many changes during pregnancy. The changes vary from woman to woman.   Your weight will continue to increase. You will notice your lower abdomen bulging out.  You may begin to get stretch marks on your hips, abdomen, and breasts.  You may develop headaches that can be relieved by medicines approved by your health care provider.  You may urinate more often because the fetus is pressing on your bladder.  You may develop or continue to have heartburn as a result of your pregnancy.  You may develop constipation because certain hormones are causing the muscles that push waste through your intestines to slow down.  You may develop hemorrhoids or swollen, bulging veins (varicose veins).  You may have back pain because of the weight gain and pregnancy hormones relaxing your joints between the bones in your pelvis and as a result of a shift in weight and the muscles that  support your balance.  Your breasts will continue to grow and be tender.  Your gums may bleed and may be sensitive to brushing and flossing.  Dark spots or blotches (chloasma, mask of pregnancy) may develop on your face. This will likely fade after the baby is born.  A dark line from your belly button to the pubic area (linea nigra) may appear. This will likely fade after the baby is born.  You may have changes in your hair. These can include thickening of your hair, rapid growth, and changes in texture. Some women also have hair loss during or after pregnancy, or hair that feels dry or thin. Your hair will most likely return to normal after your baby is born. WHAT TO EXPECT AT YOUR PRENATAL VISITS During a routine prenatal visit:  You will be weighed to make sure you and the fetus are growing normally.  Your blood pressure will be taken.  Your abdomen will be measured to track your baby's growth.  The fetal heartbeat will be listened to.  Any test results from the previous visit will be discussed. Your health care provider may ask you:  How you are feeling.  If you are feeling the baby move.  If you have had any abnormal symptoms, such as leaking fluid, bleeding, severe headaches, or abdominal cramping.  If you have any questions. Other tests that may be performed during your second trimester include:  Blood tests that check for:  Low iron levels (anemia).  Gestational diabetes (between 24 and 28 weeks).  Rh antibodies.  Urine tests to check for infections, diabetes, or protein in the urine.  An ultrasound to confirm the proper growth and development of the baby.  An amniocentesis to check for possible genetic problems.  Fetal screens for spina bifida and Down syndrome. HOME CARE INSTRUCTIONS   Avoid all smoking, herbs, alcohol, and unprescribed drugs. These chemicals affect the formation and growth of the baby.  Follow your health care provider's instructions  regarding medicine use. There are medicines that are either safe or unsafe to take during pregnancy.  Exercise only as directed by your health care provider. Experiencing uterine cramps is a good sign to stop exercising.  Continue to eat regular, healthy meals.  Wear a good support bra for breast tenderness.  Do not use hot tubs, steam rooms, or saunas.  Wear your seat belt at all times when driving.  Avoid raw meat, uncooked cheese, cat litter boxes, and soil used by cats. These carry germs that can cause birth defects in the baby.  Take your prenatal vitamins.  Try taking a stool softener (if your health care provider approves) if you develop constipation. Eat more high-fiber foods, such as fresh vegetables or fruit and whole grains. Drink plenty of fluids to keep your urine clear or pale yellow.  Take warm sitz baths to soothe any pain or discomfort caused by hemorrhoids. Use hemorrhoid cream if your health care provider approves.  If you develop varicose veins, wear support hose. Elevate your feet for 15 minutes, 3-4 times a day. Limit salt in your diet.  Avoid heavy lifting, wear low heel shoes, and practice good posture.  Rest with your legs elevated if you have leg cramps or low back pain.  Visit your dentist if you have not gone yet during your pregnancy. Use a soft toothbrush to brush your teeth and be gentle when you floss.  A sexual relationship may be continued unless your health care provider directs you otherwise.  Continue to go to all your prenatal visits as directed by your health care provider. SEEK MEDICAL CARE IF:   You have dizziness.  You have mild pelvic cramps, pelvic pressure, or nagging pain in the abdominal area.  You have persistent nausea, vomiting, or diarrhea.  You have a bad smelling vaginal discharge.  You have pain with urination. SEEK IMMEDIATE MEDICAL CARE IF:   You have a fever.  You are leaking fluid from your vagina.  You have  spotting or bleeding from your vagina.  You have severe abdominal cramping or pain.  You have rapid weight gain or loss.  You have shortness of breath with chest pain.  You notice sudden or extreme swelling of your face, hands, ankles, feet, or legs.  You have not felt your baby move in over an hour.  You have severe headaches that do not go away with medicine.  You have vision changes. Document Released: 07/01/2001 Document Revised: 07/12/2013 Document Reviewed: 09/07/2012 [facility] Patient Information 2015 [place], [place]. This information is not intended to replace advice given to you by your health care provider. Make sure you discuss any questions you have with your health care provider.

## 2014-11-06 NOTE — Progress Notes (Signed)
Low-risk OB appointment G2P1001 6835w0d Estimated Date of Delivery: 03/26/15 BP 124/76 mmHg  Pulse 80  Wt 345 lb (156.491 kg)  LMP 07/04/2014 (Approximate)  BP, weight, and urine reviewed.  Refer to obstetrical flow sheet for FH & FHR.  Reports good fm.  Denies regular uc's, lof, vb, or uti s/s. No complaints.  Interested in cb classes and circumcision- info given Reviewed today's normal anatomy u/s- spine looks normal- discussed u/s can't detect all abnormalities so still small chance of OSB defects. Discussed ptl s/s, fm Plan:  Continue routine obstetrical care  F/U in 4wks for OB appointment

## 2014-11-06 NOTE — Progress Notes (Signed)
US [redacted]W[redacted]D C/W dates,post pl grade 0,cephalic,cx 3.8cm,sdp of fluid 7.1cm,normal ov's bilat limited view,anatomy scan complete,fht 158

## 2014-12-05 ENCOUNTER — Ambulatory Visit (INDEPENDENT_AMBULATORY_CARE_PROVIDER_SITE_OTHER): Payer: Medicaid Other | Admitting: Women's Health

## 2014-12-05 ENCOUNTER — Encounter: Payer: Self-pay | Admitting: Women's Health

## 2014-12-05 VITALS — BP 122/80 | HR 68 | Wt 353.8 lb

## 2014-12-05 DIAGNOSIS — Z1389 Encounter for screening for other disorder: Secondary | ICD-10-CM | POA: Diagnosis not present

## 2014-12-05 DIAGNOSIS — O99212 Obesity complicating pregnancy, second trimester: Secondary | ICD-10-CM

## 2014-12-05 DIAGNOSIS — Z3A24 24 weeks gestation of pregnancy: Secondary | ICD-10-CM | POA: Diagnosis not present

## 2014-12-05 DIAGNOSIS — Z3492 Encounter for supervision of normal pregnancy, unspecified, second trimester: Secondary | ICD-10-CM

## 2014-12-05 DIAGNOSIS — Z331 Pregnant state, incidental: Secondary | ICD-10-CM | POA: Diagnosis not present

## 2014-12-05 LAB — POCT URINALYSIS DIPSTICK
Blood, UA: NEGATIVE
GLUCOSE UA: NEGATIVE
KETONES UA: NEGATIVE
Nitrite, UA: NEGATIVE
Protein, UA: NEGATIVE

## 2014-12-05 NOTE — Progress Notes (Signed)
Low-risk OB appointment G2P1001 3711w1d Estimated Date of Delivery: 03/26/15 BP 122/80 mmHg  Pulse 68  Wt 353 lb 12.8 oz (160.483 kg)  LMP 07/04/2014 (Approximate)  BP, weight, and urine reviewed.  Refer to obstetrical flow sheet for FH & FHR.  Reports good fm.  Denies regular uc's, lof, vb, or uti s/s. Swelling in legs, works at [employer] so on feet a lot, doesn't elevate them much when she gets off. Discussed elevating legs as much as possible, compression stockings if needed.  Sleeps on abd- recommended sleeping only on sides It+ for OSB 1:93, normal spine on anatomy u/s, offered genetic counseling- wants to do, appt for 5/18 @ 10, referral faxed Reviewed ptl s/s, fm. Plan:  Continue routine obstetrical care  F/U in 4wks for OB appointment and pn2

## 2014-12-05 NOTE — Patient Instructions (Signed)
You will have your sugar test next visit.  Please do not eat or drink anything after midnight the night before you come, not even water.  You will be here for at least two hours.     Appointment with genetic counseling tomorrow 12/06/14 @ 10am Second Trimester of Pregnancy The second trimester is from week 13 through week 28, months 4 through 6. The second trimester is often a time when you feel your best. Your body has also adjusted to being pregnant, and you begin to feel better physically. Usually, morning sickness has lessened or quit completely, you may have more energy, and you may have an increase in appetite. The second trimester is also a time when the fetus is growing rapidly. At the end of the sixth month, the fetus is about 9 inches long and weighs about 1 pounds. You will likely begin to feel the baby move (quickening) between 18 and 20 weeks of the pregnancy. BODY CHANGES Your body goes through many changes during pregnancy. The changes vary from woman to woman.   Your weight will continue to increase. You will notice your lower abdomen bulging out.  You may begin to get stretch marks on your hips, abdomen, and breasts.  You may develop headaches that can be relieved by medicines approved by your health care provider.  You may urinate more often because the fetus is pressing on your bladder.  You may develop or continue to have heartburn as a result of your pregnancy.  You may develop constipation because certain hormones are causing the muscles that push waste through your intestines to slow down.  You may develop hemorrhoids or swollen, bulging veins (varicose veins).  You may have back pain because of the weight gain and pregnancy hormones relaxing your joints between the bones in your pelvis and as a result of a shift in weight and the muscles that support your balance.  Your breasts will continue to grow and be tender.  Your gums may bleed and may be sensitive to brushing  and flossing.  Dark spots or blotches (chloasma, mask of pregnancy) may develop on your face. This will likely fade after the baby is born.  A dark line from your belly button to the pubic area (linea nigra) may appear. This will likely fade after the baby is born.  You may have changes in your hair. These can include thickening of your hair, rapid growth, and changes in texture. Some women also have hair loss during or after pregnancy, or hair that feels dry or thin. Your hair will most likely return to normal after your baby is born. WHAT TO EXPECT AT YOUR PRENATAL VISITS During a routine prenatal visit:  You will be weighed to make sure you and the fetus are growing normally.  Your blood pressure will be taken.  Your abdomen will be measured to track your baby's growth.  The fetal heartbeat will be listened to.  Any test results from the previous visit will be discussed. Your health care provider may ask you:  How you are feeling.  If you are feeling the baby move.  If you have had any abnormal symptoms, such as leaking fluid, bleeding, severe headaches, or abdominal cramping.  If you have any questions. Other tests that may be performed during your second trimester include:  Blood tests that check for:  Low iron levels (anemia).  Gestational diabetes (between 24 and 28 weeks).  Rh antibodies.  Urine tests to check for infections, diabetes,  or protein in the urine.  An ultrasound to confirm the proper growth and development of the baby.  An amniocentesis to check for possible genetic problems.  Fetal screens for spina bifida and Down syndrome. HOME CARE INSTRUCTIONS   Avoid all smoking, herbs, alcohol, and unprescribed drugs. These chemicals affect the formation and growth of the baby.  Follow your health care provider's instructions regarding medicine use. There are medicines that are either safe or unsafe to take during pregnancy.  Exercise only as directed by  your health care provider. Experiencing uterine cramps is a good sign to stop exercising.  Continue to eat regular, healthy meals.  Wear a good support bra for breast tenderness.  Do not use hot tubs, steam rooms, or saunas.  Wear your seat belt at all times when driving.  Avoid raw meat, uncooked cheese, cat litter boxes, and soil used by cats. These carry germs that can cause birth defects in the baby.  Take your prenatal vitamins.  Try taking a stool softener (if your health care provider approves) if you develop constipation. Eat more high-fiber foods, such as fresh vegetables or fruit and whole grains. Drink plenty of fluids to keep your urine clear or pale yellow.  Take warm sitz baths to soothe any pain or discomfort caused by hemorrhoids. Use hemorrhoid cream if your health care provider approves.  If you develop varicose veins, wear support hose. Elevate your feet for 15 minutes, 3-4 times a day. Limit salt in your diet.  Avoid heavy lifting, wear low heel shoes, and practice good posture.  Rest with your legs elevated if you have leg cramps or low back pain.  Visit your dentist if you have not gone yet during your pregnancy. Use a soft toothbrush to brush your teeth and be gentle when you floss.  A sexual relationship may be continued unless your health care provider directs you otherwise.  Continue to go to all your prenatal visits as directed by your health care provider. SEEK MEDICAL CARE IF:   You have dizziness.  You have mild pelvic cramps, pelvic pressure, or nagging pain in the abdominal area.  You have persistent nausea, vomiting, or diarrhea.  You have a bad smelling vaginal discharge.  You have pain with urination. SEEK IMMEDIATE MEDICAL CARE IF:   You have a fever.  You are leaking fluid from your vagina.  You have spotting or bleeding from your vagina.  You have severe abdominal cramping or pain.  You have rapid weight gain or loss.  You have  shortness of breath with chest pain.  You notice sudden or extreme swelling of your face, hands, ankles, feet, or legs.  You have not felt your baby move in over an hour.  You have severe headaches that do not go away with medicine.  You have vision changes. Document Released: 07/01/2001 Document Revised: 07/12/2013 Document Reviewed: 09/07/2012 [facility] Patient Information 2015 [place]. This information is not intended to replace advice given to you by your health care provider. Make sure you discuss any questions you have with your health care provider.

## 2014-12-06 ENCOUNTER — Ambulatory Visit (HOSPITAL_COMMUNITY)
Admission: RE | Admit: 2014-12-06 | Discharge: 2014-12-06 | Disposition: A | Discharge status: Home / Self Care | Payer: Medicaid Other | Source: Ambulatory Visit | Attending: Obstetrics and Gynecology | Admitting: Obstetrics and Gynecology

## 2014-12-06 DIAGNOSIS — O289 Unspecified abnormal findings on antenatal screening of mother: Secondary | ICD-10-CM | POA: Insufficient documentation

## 2014-12-06 DIAGNOSIS — Z3A24 24 weeks gestation of pregnancy: Secondary | ICD-10-CM

## 2014-12-06 NOTE — Progress Notes (Signed)
Genetic Counseling   DOB: 1991-05-01 Referring Provider: Cheral MarkerBooker, Kimberly R, CNM Appointment Date: 12/06/2014 Attending: Alpha GulaPaul Whitecar, MD  Ms. Langston Maskeranielle K Siracusa was seen for consultation for genetic counseling because of an elevated MSAFP of 3.32 MoMs based on maternal serum screening through [facility].    In summary:  Elevated MSAFP of 3.32 MoM is associated with a 1 in 93 (or 1.1%) chance for ONTD, ultrasound can identify most open fetal defects, but a small residual risk remains  Unexplained elevated MSAFP can be associated with 3rd trimester complications, thus ultrasound at 30-32 weeks is recommended   We reviewed Ms. Fear's maternal serum screening result, the elevation of MSAFP, and the associated 1 in 93 risk for a fetal open neural tube defect.   We reviewed ONTDs, the typical multifactorial etiology, and variable prognosis.  In addition, we discussed additional explanations for an elevated MSAFP including: normal variation, twins, feto-maternal bleeding, a gestational dating error, abdominal wall defects, kidney differences, oligohydramnios, and placental problems.  We discussed that an unexplained elevation of MSAFP is associated with an increased risk for third trimester complications including: prematurity, low birth weight, and pre-eclampsia.  They were counseled regarding the recommendation for follow up ultrasound in the 3rd trimester because of these risks.  This can be scheduled at your office or ours.  We reviewed the ultrasound report from her anatomy ultrasound.  They understand that ultrasound cannot rule out all birth defects or genetic syndromes.  However, they were counseled that 80-90% of fetuses with ONTDs can be detected by detailed 2nd trimester ultrasound, when the anatomy is well visualized.  Thus, a normal ultrasound can reduce the likelihood of an undetected anomaly, but not eliminate it.  Ms. Gaynelle AduMcCain was provided with written information regarding  sickle cell anemia (SCA) including the carrier frequency and incidence in the African American population, the availability of carrier testing and prenatal diagnosis if indicated.  In addition, we discussed that hemoglobinopathies are routinely screened for as part of the [place] newborn screening panel.  She was notified that this testing was previously performed and found to be within normal limits.  Both family histories were reviewed and found to be noncontributory for birth defects, intellectual disability, and known genetic conditions.  Without further information regarding the provided family history, an accurate genetic risk cannot be calculated. Further genetic counseling is warranted if more information is obtained.  Ms. Gaynelle AduMcCain denied exposure to environmental toxins or chemical agents. She denied the use of alcohol, tobacco or street drugs. She denied significant viral illnesses during the course of her pregnancy. Her medical and surgical histories were noncontributory.   I counseled this couple for approximately 40 minutes regarding the above risks and available options.    Mady Gemmaaragh Draysen Weygandt, MS,  Certified Genetic Counselor

## 2014-12-21 ENCOUNTER — Ambulatory Visit (INDEPENDENT_AMBULATORY_CARE_PROVIDER_SITE_OTHER): Payer: Medicaid Other | Admitting: Obstetrics and Gynecology

## 2014-12-21 VITALS — BP 126/70 | HR 92 | Wt 356.2 lb

## 2014-12-21 DIAGNOSIS — Z3A26 26 weeks gestation of pregnancy: Secondary | ICD-10-CM | POA: Diagnosis not present

## 2014-12-21 DIAGNOSIS — O09892 Supervision of other high risk pregnancies, second trimester: Secondary | ICD-10-CM | POA: Diagnosis not present

## 2014-12-21 DIAGNOSIS — O285 Abnormal chromosomal and genetic finding on antenatal screening of mother: Secondary | ICD-10-CM

## 2014-12-21 DIAGNOSIS — O289 Unspecified abnormal findings on antenatal screening of mother: Secondary | ICD-10-CM

## 2014-12-21 DIAGNOSIS — Z331 Pregnant state, incidental: Secondary | ICD-10-CM

## 2014-12-21 DIAGNOSIS — O99212 Obesity complicating pregnancy, second trimester: Secondary | ICD-10-CM

## 2014-12-21 DIAGNOSIS — Z1389 Encounter for screening for other disorder: Secondary | ICD-10-CM | POA: Diagnosis not present

## 2014-12-21 LAB — POCT URINALYSIS DIPSTICK
Glucose, UA: NEGATIVE
Ketones, UA: NEGATIVE
Leukocytes, UA: NEGATIVE
NITRITE UA: NEGATIVE
Protein, UA: NEGATIVE
RBC UA: NEGATIVE

## 2014-12-21 NOTE — Progress Notes (Signed)
Patient ID: Kristina Scott, female   DOB: June 17, 1991, 24 y.o.   MRN: [id number]  G2P1001 1350w3d Estimated Date of Delivery: 03/26/15  Blood pressure 126/70, pulse 92, weight 356 lb 3.2 oz (161.571 kg), last menstrual period 07/04/2014.   refer to the ob flow sheet for FH and FHR, also BP, Wt, Urine results:notable for negative  Patient reports  + good fetal movement, denies any bleeding and no rupture of membranes symptoms or regular contractions. Patient complaints: Patient complains of numbness in her right hand and arm. She states she sometimes sleeps with her arms above her bed, but usually sleeps with her arms by her side. She complains of associated swelling in her bilateral feet.   FHR: 159 bpm FH: 33 cm  Questions were answered. Assessment: Pregnancy 1050w3d, LROB, morbid obesity                         Paresthesia due to edema, wt gain Plan:  Continued routine obstetrical care, pt reassured, advised to monitor sodium intake. Remain active. Avoid excess weight gain  F/u in 2 weeks for pnx care as sched   This chart was SCRIBED for Christin BachJohn Italy Warriner, MD by Ronney LionSuzanne Le, ED Scribe. This patient was seen in room 3 and the patient's care was started at 9:53 AM.  I personally performed the services described in this documentation, which was SCRIBED in my presence. The recorded information has been reviewed and considered accurate. It has been edited as necessary during review. Tilda BurrowFERGUSON,Galvin Aversa V, MD

## 2015-01-02 ENCOUNTER — Encounter: Payer: Self-pay | Admitting: Women's Health

## 2015-01-02 ENCOUNTER — Ambulatory Visit (INDEPENDENT_AMBULATORY_CARE_PROVIDER_SITE_OTHER): Payer: Medicaid Other | Admitting: Women's Health

## 2015-01-02 ENCOUNTER — Other Ambulatory Visit: Payer: Medicaid Other

## 2015-01-02 VITALS — BP 112/74 | HR 84 | Wt 355.6 lb

## 2015-01-02 DIAGNOSIS — Z1389 Encounter for screening for other disorder: Secondary | ICD-10-CM | POA: Diagnosis not present

## 2015-01-02 DIAGNOSIS — Z131 Encounter for screening for diabetes mellitus: Secondary | ICD-10-CM

## 2015-01-02 DIAGNOSIS — Z331 Pregnant state, incidental: Secondary | ICD-10-CM

## 2015-01-02 DIAGNOSIS — O09893 Supervision of other high risk pregnancies, third trimester: Secondary | ICD-10-CM | POA: Diagnosis not present

## 2015-01-02 DIAGNOSIS — O285 Abnormal chromosomal and genetic finding on antenatal screening of mother: Secondary | ICD-10-CM

## 2015-01-02 DIAGNOSIS — O99213 Obesity complicating pregnancy, third trimester: Secondary | ICD-10-CM

## 2015-01-02 DIAGNOSIS — Z3A28 28 weeks gestation of pregnancy: Secondary | ICD-10-CM | POA: Diagnosis not present

## 2015-01-02 DIAGNOSIS — Z369 Encounter for antenatal screening, unspecified: Secondary | ICD-10-CM

## 2015-01-02 LAB — POCT URINALYSIS DIPSTICK
Blood, UA: NEGATIVE
GLUCOSE UA: NEGATIVE
Ketones, UA: NEGATIVE
NITRITE UA: NEGATIVE
Protein, UA: NEGATIVE

## 2015-01-02 NOTE — Progress Notes (Signed)
High Risk Pregnancy Diagnosis(es): elevated AFP MoM G2P1001 23w1dEstimated Date of Delivery: 03/26/15 BP 112/74 mmHg  Pulse 84  Wt 355 lb 9.6 oz (161.299 kg)  LMP 07/04/2014 (Approximate)  Urinalysis: Negative HPI:  Hands tingling/numb- does repetitive work w/ hands at [employer] to wear wrist splints, feet swelling- to elevate as much as possible BP, weight, and urine reviewed.  Reports good fm. Denies regular uc's, lof, vb, uti s/s. No complaints. Met w/ genetic counselor- went well. They recommend u/s at 30-32wks d/t elevated AFP MoM- discussed w/ L[facility]- will do monthly growth u/s and begin 2x/wk testing at 32wks  Fundal Height:  33 Fetal Heart rate:  143 Edema:  trace  Reviewed ptl s/s, fkc. Recommended Tdap at HD/PCP per CDC guidelines.  All questions were answered Assessment: 255w1dlevated AFP MoM, increased r/f OSB 1:93 w/ normal detailed u/s Medication(s) Plans:  n/a Treatment Plan:  q4wk growth u/s, 2x/wk testing at 32wks, IOL @ 39-40wks Follow up in asap for efw/afi u/s (no visit), then 4wks for high-risk OB appt and efw/afi/bpp u/s

## 2015-01-02 NOTE — Patient Instructions (Signed)
Wrist splints Elevate legs above level of your heart as much as possible to help with swelling Compression stockings at [facility] if needed  Call the office [phone]) or go to [facility] if:  You begin to have strong, frequent contractions  Your water breaks.  Sometimes it is a big gush of fluid, sometimes it is just a trickle that keeps getting your panties wet or running down your legs  You have vaginal bleeding.  It is normal to have a small amount of spotting if your cervix was checked.   You don't feel your baby moving like normal.  If you don't, get you something to eat and drink and lay down and focus on feeling your baby move.  You should feel at least 10 movements in 2 hours.  If you don't, you should call the office or go to [facility]    Tdap Vaccine  It is recommended that you get the Tdap vaccine during the third trimester of EACH pregnancy to help protect your baby from getting pertussis (whooping cough)  27-36 weeks is the BEST time to do this so that you can pass the protection on to your baby. During pregnancy is better than after pregnancy, but if you are unable to get it during pregnancy it will be offered at the hospital.   You can get this vaccine at the health department or your family doctor  Everyone who will be around your baby should also be up-to-date on their vaccines. Adults (who are not pregnant) only need 1 dose of Tdap during adulthood.   Third Trimester of Pregnancy The third trimester is from week 29 through week 42, months 7 through 9. The third trimester is a time when the fetus is growing rapidly. At the end of the ninth month, the fetus is about 20 inches in length and weighs 6-10 pounds.  BODY CHANGES Your body goes through many changes during pregnancy. The changes vary from woman to woman.   Your weight will continue to increase. You can expect to gain 25-35 pounds (11-16 kg) by the end of the pregnancy.  You may begin to  get stretch marks on your hips, abdomen, and breasts.  You may urinate more often because the fetus is moving lower into your pelvis and pressing on your bladder.  You may develop or continue to have heartburn as a result of your pregnancy.  You may develop constipation because certain hormones are causing the muscles that push waste through your intestines to slow down.  You may develop hemorrhoids or swollen, bulging veins (varicose veins).  You may have pelvic pain because of the weight gain and pregnancy hormones relaxing your joints between the bones in your pelvis. Backaches may result from overexertion of the muscles supporting your posture.  You may have changes in your hair. These can include thickening of your hair, rapid growth, and changes in texture. Some women also have hair loss during or after pregnancy, or hair that feels dry or thin. Your hair will most likely return to normal after your baby is born.  Your breasts will continue to grow and be tender. A yellow discharge may leak from your breasts called colostrum.  Your belly button may stick out.  You may feel short of breath because of your expanding uterus.  You may notice the fetus "dropping," or moving lower in your abdomen.  You may have a bloody mucus discharge. This usually occurs a few days to a week before labor  begins.  Your cervix becomes thin and soft (effaced) near your due date. WHAT TO EXPECT AT YOUR PRENATAL EXAMS  You will have prenatal exams every 2 weeks until week 36. Then, you will have weekly prenatal exams. During a routine prenatal visit:  You will be weighed to make sure you and the fetus are growing normally.  Your blood pressure is taken.  Your abdomen will be measured to track your baby's growth.  The fetal heartbeat will be listened to.  Any test results from the previous visit will be discussed.  You may have a cervical check near your due date to see if you have effaced. At  around 36 weeks, your caregiver will check your cervix. At the same time, your caregiver will also perform a test on the secretions of the vaginal tissue. This test is to determine if a type of bacteria, Group B streptococcus, is present. Your caregiver will explain this further. Your caregiver may ask you:  What your birth plan is.  How you are feeling.  If you are feeling the baby move.  If you have had any abnormal symptoms, such as leaking fluid, bleeding, severe headaches, or abdominal cramping.  If you have any questions. Other tests or screenings that may be performed during your third trimester include:  Blood tests that check for low iron levels (anemia).  Fetal testing to check the health, activity level, and growth of the fetus. Testing is done if you have certain medical conditions or if there are problems during the pregnancy. FALSE LABOR You may feel small, irregular contractions that eventually go away. These are called Braxton Hicks contractions, or false labor. Contractions may last for hours, days, or even weeks before true labor sets in. If contractions come at regular intervals, intensify, or become painful, it is best to be seen by your caregiver.  SIGNS OF LABOR   Menstrual-like cramps.  Contractions that are 5 minutes apart or less.  Contractions that start on the top of the uterus and spread down to the lower abdomen and back.  A sense of increased pelvic pressure or back pain.  A watery or bloody mucus discharge that comes from the vagina. If you have any of these signs before the 37th week of pregnancy, call your caregiver right away. You need to go to the hospital to get checked immediately. HOME CARE INSTRUCTIONS   Avoid all smoking, herbs, alcohol, and unprescribed drugs. These chemicals affect the formation and growth of the baby.  Follow your caregiver's instructions regarding medicine use. There are medicines that are either safe or unsafe to take  during pregnancy.  Exercise only as directed by your caregiver. Experiencing uterine cramps is a good sign to stop exercising.  Continue to eat regular, healthy meals.  Wear a good support bra for breast tenderness.  Do not use hot tubs, steam rooms, or saunas.  Wear your seat belt at all times when driving.  Avoid raw meat, uncooked cheese, cat litter boxes, and soil used by cats. These carry germs that can cause birth defects in the baby.  Take your prenatal vitamins.  Try taking a stool softener (if your caregiver approves) if you develop constipation. Eat more high-fiber foods, such as fresh vegetables or fruit and whole grains. Drink plenty of fluids to keep your urine clear or pale yellow.  Take warm sitz baths to soothe any pain or discomfort caused by hemorrhoids. Use hemorrhoid cream if your caregiver approves.  If you develop varicose veins,  wear support hose. Elevate your feet for 15 minutes, 3-4 times a day. Limit salt in your diet.  Avoid heavy lifting, wear low heal shoes, and practice good posture.  Rest a lot with your legs elevated if you have leg cramps or low back pain.  Visit your dentist if you have not gone during your pregnancy. Use a soft toothbrush to brush your teeth and be gentle when you floss.  A sexual relationship may be continued unless your caregiver directs you otherwise.  Do not travel far distances unless it is absolutely necessary and only with the approval of your caregiver.  Take prenatal classes to understand, practice, and ask questions about the labor and delivery.  Make a trial run to the hospital.  Pack your hospital bag.  Prepare the baby's nursery.  Continue to go to all your prenatal visits as directed by your caregiver. SEEK MEDICAL CARE IF:  You are unsure if you are in labor or if your water has broken.  You have dizziness.  You have mild pelvic cramps, pelvic pressure, or nagging pain in your abdominal area.  You have  persistent nausea, vomiting, or diarrhea.  You have a bad smelling vaginal discharge.  You have pain with urination. SEEK IMMEDIATE MEDICAL CARE IF:   You have a fever.  You are leaking fluid from your vagina.  You have spotting or bleeding from your vagina.  You have severe abdominal cramping or pain.  You have rapid weight loss or gain.  You have shortness of breath with chest pain.  You notice sudden or extreme swelling of your face, hands, ankles, feet, or legs.  You have not felt your baby move in over an hour.  You have severe headaches that do not go away with medicine.  You have vision changes. Document Released: 07/01/2001 Document Revised: 07/12/2013 Document Reviewed: 09/07/2012 [facility] Patient Information 2015 [place], [place]. This information is not intended to replace advice given to you by your health care provider. Make sure you discuss any questions you have with your health care provider.

## 2015-01-04 ENCOUNTER — Telehealth: Payer: Self-pay | Admitting: Women's Health

## 2015-01-04 ENCOUNTER — Other Ambulatory Visit: Payer: Self-pay | Admitting: Obstetrics & Gynecology

## 2015-01-04 ENCOUNTER — Ambulatory Visit (INDEPENDENT_AMBULATORY_CARE_PROVIDER_SITE_OTHER): Payer: Medicaid Other

## 2015-01-04 DIAGNOSIS — Z2839 Other underimmunization status: Secondary | ICD-10-CM

## 2015-01-04 DIAGNOSIS — O403XX1 Polyhydramnios, third trimester, fetus 1: Secondary | ICD-10-CM | POA: Diagnosis not present

## 2015-01-04 DIAGNOSIS — Z283 Underimmunization status: Secondary | ICD-10-CM

## 2015-01-04 DIAGNOSIS — O9989 Other specified diseases and conditions complicating pregnancy, childbirth and the puerperium: Principal | ICD-10-CM

## 2015-01-04 DIAGNOSIS — O09892 Supervision of other high risk pregnancies, second trimester: Secondary | ICD-10-CM

## 2015-01-04 DIAGNOSIS — O285 Abnormal chromosomal and genetic finding on antenatal screening of mother: Secondary | ICD-10-CM

## 2015-01-04 DIAGNOSIS — O99012 Anemia complicating pregnancy, second trimester: Secondary | ICD-10-CM

## 2015-01-04 LAB — CBC
Hematocrit: 31.7 % — ABNORMAL LOW (ref 34.0–46.6)
Hemoglobin: 10.6 g/dL — ABNORMAL LOW (ref 11.1–15.9)
MCH: 30.3 pg (ref 26.6–33.0)
MCHC: 33.4 g/dL (ref 31.5–35.7)
MCV: 91 fL (ref 79–97)
PLATELETS: 278 10*3/uL (ref 150–379)
RBC: 3.5 x10E6/uL — ABNORMAL LOW (ref 3.77–5.28)
RDW: 14.3 % (ref 12.3–15.4)
WBC: 10.5 10*3/uL (ref 3.4–10.8)

## 2015-01-04 LAB — GLUCOSE TOLERANCE, 2 HOURS W/ 1HR
GLUCOSE, 1 HOUR: 118 mg/dL (ref 65–179)
GLUCOSE, 2 HOUR: 71 mg/dL (ref 65–152)
Glucose, Fasting: 74 mg/dL (ref 65–91)

## 2015-01-04 LAB — ANTIBODY SCREEN: ANTIBODY SCREEN: NEGATIVE

## 2015-01-04 LAB — HSV 2 ANTIBODY, IGG: HSV 2 GLYCOPROTEIN G AB, IGG: 6.31 {index} — AB (ref 0.00–0.90)

## 2015-01-04 LAB — RPR: RPR Ser Ql: NONREACTIVE

## 2015-01-04 LAB — HIV ANTIBODY (ROUTINE TESTING W REFLEX): HIV SCREEN 4TH GENERATION: NONREACTIVE

## 2015-01-04 NOTE — Progress Notes (Addendum)
[place] 28+3wks efw 1580g 81.4%,fht 122bpm,polyhydramnios 20.7cm,cephalic,fundal pl gr 2,limited view of ov's

## 2015-01-04 NOTE — Telephone Encounter (Signed)
Pt aware of Sugar test results and aware of Hgb, pt states that she is currently taking her PNV and extra Fe. Pt advised to continue to take her medications as advised and that she could go over this and blood results at her next visit.

## 2015-01-28 LAB — US OB FOLLOW UP

## 2015-01-30 ENCOUNTER — Ambulatory Visit (INDEPENDENT_AMBULATORY_CARE_PROVIDER_SITE_OTHER): Payer: Medicaid Other | Admitting: Advanced Practice Midwife

## 2015-01-30 ENCOUNTER — Encounter: Payer: Self-pay | Admitting: Advanced Practice Midwife

## 2015-01-30 ENCOUNTER — Ambulatory Visit (INDEPENDENT_AMBULATORY_CARE_PROVIDER_SITE_OTHER): Payer: Medicaid Other

## 2015-01-30 VITALS — BP 110/60 | HR 88 | Wt 361.0 lb

## 2015-01-30 DIAGNOSIS — Z283 Underimmunization status: Secondary | ICD-10-CM

## 2015-01-30 DIAGNOSIS — Z3A32 32 weeks gestation of pregnancy: Secondary | ICD-10-CM

## 2015-01-30 DIAGNOSIS — O285 Abnormal chromosomal and genetic finding on antenatal screening of mother: Secondary | ICD-10-CM

## 2015-01-30 DIAGNOSIS — Z1389 Encounter for screening for other disorder: Secondary | ICD-10-CM

## 2015-01-30 DIAGNOSIS — Z331 Pregnant state, incidental: Secondary | ICD-10-CM

## 2015-01-30 DIAGNOSIS — O09893 Supervision of other high risk pregnancies, third trimester: Secondary | ICD-10-CM | POA: Diagnosis not present

## 2015-01-30 DIAGNOSIS — O99213 Obesity complicating pregnancy, third trimester: Secondary | ICD-10-CM

## 2015-01-30 DIAGNOSIS — O09899 Supervision of other high risk pregnancies, unspecified trimester: Secondary | ICD-10-CM

## 2015-01-30 DIAGNOSIS — O09892 Supervision of other high risk pregnancies, second trimester: Secondary | ICD-10-CM

## 2015-01-30 DIAGNOSIS — O99012 Anemia complicating pregnancy, second trimester: Secondary | ICD-10-CM

## 2015-01-30 DIAGNOSIS — O9989 Other specified diseases and conditions complicating pregnancy, childbirth and the puerperium: Principal | ICD-10-CM

## 2015-01-30 DIAGNOSIS — R7689 Other specified abnormal immunological findings in serum: Secondary | ICD-10-CM | POA: Insufficient documentation

## 2015-01-30 DIAGNOSIS — Z3490 Encounter for supervision of normal pregnancy, unspecified, unspecified trimester: Secondary | ICD-10-CM | POA: Insufficient documentation

## 2015-01-30 DIAGNOSIS — R768 Other specified abnormal immunological findings in serum: Secondary | ICD-10-CM | POA: Insufficient documentation

## 2015-01-30 LAB — POCT URINALYSIS DIPSTICK
Blood, UA: NEGATIVE
Glucose, UA: NEGATIVE
KETONES UA: NEGATIVE
Leukocytes, UA: NEGATIVE
NITRITE UA: NEGATIVE
PROTEIN UA: NEGATIVE

## 2015-01-30 MED ORDER — VALACYCLOVIR HCL 500 MG PO TABS: 500.0000 mg | ORAL_TABLET | Freq: Two times a day (BID) | ORAL | Status: DC

## 2015-01-30 NOTE — Progress Notes (Signed)
Fetal Surveillance Testing today:  BPP/ EFW   High Risk Pregnancy Diagnosis(es):   Abnormal AFP MoM  G2P1001 313w1d Estimated Date of Delivery: 03/26/15  Blood pressure 110/60, pulse 88, weight 361 lb (163.749 kg), last menstrual period 07/04/2014.  Urinalysis: Negative   HPI: The patient is being seen today for ongoing management of pregnancy/abnromal AFP (MoM >2.5x normal). She also had borderline polyhydramnios at last US Today she reports no complaints.   BP weight and urine results all reviewed and noted. Patient reports good fetal movement, denies any bleeding and no rupture of membranes symptoms or regular contractions.   Edema:  1+  Patient is without complaints other than noted in her HPI. Discussed + HSV2 All questions were answered.  All lab and sonogram results have been reviewed.  US 32+1wks,efw 2556g 84.7%,cephalic,post fundal pl gr 3,normal ov's bilat,afi 18cm,BPP 8/8,FHT 149BPM Comments: normal   Assessment:  1.  Pregnancy at 723w1d,  Estimated Date of Delivery: 03/26/15 :  Normal US                        2.  HSV 2 +                        3.  Normal AFI  Medication(s) Plans:  Valtrex 500mg  BID (per request)  Treatment Plan:  Start twice weekly testing next week (BPP normal today)  Follow up in 1 weeks for appointment for high risk OB care, NST

## 2015-01-30 NOTE — Progress Notes (Signed)
Pt denies any problems or concerns at this time.  

## 2015-01-30 NOTE — Patient Instructions (Signed)
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  I am assuming you did not know that you had herpes, since it is not listed in your medical history.  MOST people do not know that they have it, as only 15% of people have outbreaks.  However, it is still transmittable to other people, including the baby (but only during the birth).  We place you on a medicine called acyclovir the last 6 weeks of your pregnancy to prevent this from happening.  Feel free to call the office or ask questions at your next appointment.  Drenda FreezeFran

## 2015-01-30 NOTE — Progress Notes (Signed)
US 32+1wks,efw 2556g 84.7%,cephalic,post fundal pl gr 3,normal ov's bilat,afi 18cm,BPP 8/8,FHT 149BPM

## 2015-02-13 ENCOUNTER — Other Ambulatory Visit: Payer: Self-pay | Admitting: Obstetrics & Gynecology

## 2015-02-13 ENCOUNTER — Encounter: Payer: Medicaid Other | Admitting: Women's Health

## 2015-02-13 ENCOUNTER — Other Ambulatory Visit (INDEPENDENT_AMBULATORY_CARE_PROVIDER_SITE_OTHER): Payer: Medicaid Other

## 2015-02-13 ENCOUNTER — Encounter: Payer: Self-pay | Admitting: Obstetrics & Gynecology

## 2015-02-13 ENCOUNTER — Ambulatory Visit (INDEPENDENT_AMBULATORY_CARE_PROVIDER_SITE_OTHER): Payer: Medicaid Other | Admitting: Obstetrics & Gynecology

## 2015-02-13 VITALS — BP 110/70 | HR 76 | Wt 365.4 lb

## 2015-02-13 DIAGNOSIS — O09893 Supervision of other high risk pregnancies, third trimester: Secondary | ICD-10-CM

## 2015-02-13 DIAGNOSIS — Z331 Pregnant state, incidental: Secondary | ICD-10-CM | POA: Diagnosis not present

## 2015-02-13 DIAGNOSIS — O289 Unspecified abnormal findings on antenatal screening of mother: Secondary | ICD-10-CM

## 2015-02-13 DIAGNOSIS — Z3493 Encounter for supervision of normal pregnancy, unspecified, third trimester: Secondary | ICD-10-CM

## 2015-02-13 DIAGNOSIS — Z1389 Encounter for screening for other disorder: Secondary | ICD-10-CM

## 2015-02-13 DIAGNOSIS — O285 Abnormal chromosomal and genetic finding on antenatal screening of mother: Secondary | ICD-10-CM

## 2015-02-13 DIAGNOSIS — O288 Other abnormal findings on antenatal screening of mother: Secondary | ICD-10-CM

## 2015-02-13 DIAGNOSIS — Z283 Underimmunization status: Secondary | ICD-10-CM

## 2015-02-13 DIAGNOSIS — O99013 Anemia complicating pregnancy, third trimester: Secondary | ICD-10-CM

## 2015-02-13 DIAGNOSIS — O09899 Supervision of other high risk pregnancies, unspecified trimester: Secondary | ICD-10-CM

## 2015-02-13 DIAGNOSIS — R768 Other specified abnormal immunological findings in serum: Secondary | ICD-10-CM

## 2015-02-13 DIAGNOSIS — O9989 Other specified diseases and conditions complicating pregnancy, childbirth and the puerperium: Secondary | ICD-10-CM

## 2015-02-13 LAB — POCT URINALYSIS DIPSTICK
Blood, UA: NEGATIVE
GLUCOSE UA: NEGATIVE
Ketones, UA: NEGATIVE
LEUKOCYTES UA: NEGATIVE
Nitrite, UA: NEGATIVE

## 2015-02-13 NOTE — Progress Notes (Signed)
[place] 34+1wks,cephalic,afi 21cm,BPP 6/8 no breathing,RI .57,.53,post pl gr 3

## 2015-02-14 NOTE — Progress Notes (Signed)
Fetal Surveillance Testing today:  NR NST, BPP 6/8 with excellent movement tone fluid, lots of hiccups which interfered with the breathing motion NST was non reactive and had 2 areas of FHR decelerations, again could not get pt in left bump or side postition  High Risk Pregnancy Diagnosis(es):   Abnormal IT(ONTD) with negative findings  G2P1001 [redacted]w[redacted]d Estimated Date of Delivery: 03/26/15  Blood pressure 110/70, pulse 76, weight 365 lb 6.4 oz (165.744 kg), last menstrual period 07/04/2014.  Urinalysis: Negative   HPI: The patient is being seen today for ongoing management of fetal evaluation of elevated ONTD risk on IT with negative anatomical findings. Today she reports good FM back and pelvic pressure   BP weight and urine results all reviewed and noted. Patient reports good fetal movement, denies any bleeding and no rupture of membranes symptoms or regular contractions. Pt is diificult to monitor due to maternal size and inability to stay in optimal left side position   Fundal Height:  U+28 Fetal Heart rate:  140 Edema:  1+  Patient is without complaints other than noted in her HPI. All questions were answered.  All lab and sonogram results have been reviewed. Comments: abnormal: IT testing   Assessment:  1.  Pregnancy at [redacted]w[redacted]d,  Estimated Date of Delivery: 03/26/15 :                          2.  Elevated ONTD risk with normal anatomical evalaution                        3.  Morbid obesity  Medication(s) Plans:  No changes  Treatment Plan:  Twice weekly surveillance  Follow up in Friday weeks for appointment for high risk OB care, NST If NST continue to be impossible to obtain then we may have to do weekly BPP which would be adequate surveillance

## 2015-02-16 ENCOUNTER — Ambulatory Visit (INDEPENDENT_AMBULATORY_CARE_PROVIDER_SITE_OTHER): Payer: Medicaid Other | Admitting: Obstetrics & Gynecology

## 2015-02-16 ENCOUNTER — Ambulatory Visit: Payer: Medicaid Other

## 2015-02-16 ENCOUNTER — Other Ambulatory Visit: Payer: Medicaid Other

## 2015-02-16 ENCOUNTER — Encounter: Payer: Self-pay | Admitting: Obstetrics & Gynecology

## 2015-02-16 VITALS — BP 138/90 | HR 72 | Wt 367.2 lb

## 2015-02-16 DIAGNOSIS — Z1389 Encounter for screening for other disorder: Secondary | ICD-10-CM

## 2015-02-16 DIAGNOSIS — O99213 Obesity complicating pregnancy, third trimester: Secondary | ICD-10-CM

## 2015-02-16 DIAGNOSIS — O285 Abnormal chromosomal and genetic finding on antenatal screening of mother: Secondary | ICD-10-CM | POA: Diagnosis not present

## 2015-02-16 DIAGNOSIS — Z331 Pregnant state, incidental: Secondary | ICD-10-CM

## 2015-02-16 DIAGNOSIS — Z3A34 34 weeks gestation of pregnancy: Secondary | ICD-10-CM

## 2015-02-16 DIAGNOSIS — O09893 Supervision of other high risk pregnancies, third trimester: Secondary | ICD-10-CM | POA: Diagnosis not present

## 2015-02-16 LAB — POCT URINALYSIS DIPSTICK
Blood, UA: NEGATIVE
Glucose, UA: NEGATIVE
Leukocytes, UA: NEGATIVE
Nitrite, UA: NEGATIVE

## 2015-02-16 NOTE — Progress Notes (Signed)
Fetal Surveillance Testing today:  Reactive NST   High Risk Pregnancy Diagnosis(es):   Elevated ONTD risk on IT9normal anatomy evaluation)  G2P1001 [redacted]w[redacted]d Estimated Date of Delivery: 03/26/15  Blood pressure 150/90, pulse 72, weight 367 lb 3.2 oz (166.561 kg), last menstrual period 07/04/2014.  Urinalysis: Negative   HPI: The patient is being seen today for ongoing management of as above. Today she reports no problems   BP weight and urine results all reviewed and noted. Patient reports good fetal movement, denies any bleeding and no rupture of membranes symptoms or regular contractions.  Fundal Height:  U+28 refers to 28 above Fetal Heart rate:  140 Edema:  2+  Patient is without complaints other than noted in her HPI. All questions were answered.  All lab and sonogram results have been reviewed. Comments: abnormal: IT testing   Assessment:  1.  Pregnancy at [redacted]w[redacted]d,  Estimated Date of Delivery: 03/26/15 :                          2.  Elevated ONTD risk on IT testing with normal anatomy                        3.  Borderline BP :  Will re evaluate when she is seen next week but pt knows her BP is creeping up and may impact care  Medication(s) Plans:  No changes  Treatment Plan:  twice weekly surveillance, EFW q 3 weeks  Follow up in tuesday weeks for appointment for high risk OB care, NST

## 2015-02-20 ENCOUNTER — Encounter: Payer: Self-pay | Admitting: Advanced Practice Midwife

## 2015-02-20 ENCOUNTER — Other Ambulatory Visit: Payer: Medicaid Other

## 2015-02-20 ENCOUNTER — Ambulatory Visit (INDEPENDENT_AMBULATORY_CARE_PROVIDER_SITE_OTHER): Payer: Medicaid Other | Admitting: Advanced Practice Midwife

## 2015-02-20 VITALS — BP 120/84 | HR 84 | Wt 371.0 lb

## 2015-02-20 DIAGNOSIS — Z1389 Encounter for screening for other disorder: Secondary | ICD-10-CM | POA: Diagnosis not present

## 2015-02-20 DIAGNOSIS — O09893 Supervision of other high risk pregnancies, third trimester: Secondary | ICD-10-CM | POA: Diagnosis not present

## 2015-02-20 DIAGNOSIS — Z331 Pregnant state, incidental: Secondary | ICD-10-CM

## 2015-02-20 DIAGNOSIS — R809 Proteinuria, unspecified: Secondary | ICD-10-CM

## 2015-02-20 DIAGNOSIS — O285 Abnormal chromosomal and genetic finding on antenatal screening of mother: Secondary | ICD-10-CM

## 2015-02-20 LAB — POCT URINALYSIS DIPSTICK
GLUCOSE UA: NEGATIVE
Ketones, UA: NEGATIVE
Leukocytes, UA: NEGATIVE
Nitrite, UA: NEGATIVE
RBC UA: NEGATIVE

## 2015-02-20 NOTE — Addendum Note (Signed)
Addended by: Jacklyn Shell on: 02/20/2015 04:28 PM   Modules accepted: Orders

## 2015-02-20 NOTE — Progress Notes (Signed)
Pt states that she is having pain on her left side and her lower back.

## 2015-02-20 NOTE — Progress Notes (Signed)
Fetal Surveillance Testing today:  NST   High Risk Pregnancy Diagnosis(es):   Abnormal AFP  G2P1001 [redacted]w[redacted]d Estimated Date of Delivery: 03/26/15  Blood pressure 120/84, pulse 84, weight 371 lb (168.284 kg), last menstrual period 07/04/2014.  Urinalysis: Positive for +1 protein   HPI: The patient is being seen today for ongoing management of pregnancy and abnormal afp with normal US findings. Today she reports lower back and left pelvic pain, c/w musculoskeletal pains of pregnancy   BP weight and urine results all reviewed and noted. Patient reports good fetal movement, denies any bleeding and no rupture of membranes symptoms or regular contractions.  Fundal Height:  U+ 30 Fetal Heart rate:  145 Edema:  2+  Patient is without complaints other than noted in her HPI. All questions were answered.Blood pressure normal today Pt wants to keep working for 2 more weeks.  OK unless BP goes up All lab and sonogram results have been reviewed. Comments:  None today  Assessment:  1.  Pregnancy at [redacted]w[redacted]d,  Estimated Date of Delivery: 03/26/15 :  Reactive NST                        2.  Abnormal afp                        3.  proteinuria  Medication(s) Plans:  none  Treatment Plan:  Pr/Cr ratio; Growth [place] Friday or Tuesday.  Continue twice weekly testing. Given info on Kinesiology tape for pregnancy

## 2015-02-20 NOTE — Patient Instructions (Signed)
KINESIOLOGY TAPING FOR PREGNANCY

## 2015-02-21 LAB — PROTEIN / CREATININE RATIO, URINE
Creatinine, Urine: 246.4 mg/dL
Protein, Ur: 40.6 mg/dL
Protein/Creat Ratio: 165 mg/g creat (ref 0–200)

## 2015-02-23 ENCOUNTER — Ambulatory Visit (INDEPENDENT_AMBULATORY_CARE_PROVIDER_SITE_OTHER): Payer: Medicaid Other

## 2015-02-23 DIAGNOSIS — O285 Abnormal chromosomal and genetic finding on antenatal screening of mother: Secondary | ICD-10-CM

## 2015-02-23 DIAGNOSIS — Z283 Underimmunization status: Secondary | ICD-10-CM

## 2015-02-23 DIAGNOSIS — R768 Other specified abnormal immunological findings in serum: Secondary | ICD-10-CM

## 2015-02-23 DIAGNOSIS — Z2839 Other underimmunization status: Secondary | ICD-10-CM

## 2015-02-23 DIAGNOSIS — O9989 Other specified diseases and conditions complicating pregnancy, childbirth and the puerperium: Secondary | ICD-10-CM

## 2015-02-23 DIAGNOSIS — O99013 Anemia complicating pregnancy, third trimester: Secondary | ICD-10-CM

## 2015-02-23 DIAGNOSIS — O09893 Supervision of other high risk pregnancies, third trimester: Secondary | ICD-10-CM

## 2015-02-23 DIAGNOSIS — Z3493 Encounter for supervision of normal pregnancy, unspecified, third trimester: Secondary | ICD-10-CM

## 2015-02-23 LAB — US OB FOLLOW UP

## 2015-02-23 NOTE — Progress Notes (Signed)
[place] 35+4wks efw 2937g,63.4%,post pl gr 3,normal ov's bilat,BPP 8/8,cephalic,afi 11.7cm

## 2015-02-25 LAB — US OB FOLLOW UP

## 2015-02-27 ENCOUNTER — Encounter: Payer: Self-pay | Admitting: Obstetrics & Gynecology

## 2015-02-27 ENCOUNTER — Encounter (HOSPITAL_COMMUNITY): Payer: Self-pay | Admitting: *Deleted

## 2015-02-27 ENCOUNTER — Inpatient Hospital Stay (HOSPITAL_COMMUNITY)
Admission: AD | Admit: 2015-02-27 | Discharge: 2015-02-27 | Disposition: A | Discharge status: Home / Self Care | Payer: Medicaid Other | Source: Ambulatory Visit | Attending: Obstetrics & Gynecology | Admitting: Obstetrics & Gynecology

## 2015-02-27 ENCOUNTER — Ambulatory Visit (INDEPENDENT_AMBULATORY_CARE_PROVIDER_SITE_OTHER): Payer: Medicaid Other | Admitting: Obstetrics & Gynecology

## 2015-02-27 ENCOUNTER — Inpatient Hospital Stay (HOSPITAL_COMMUNITY): Payer: Medicaid Other

## 2015-02-27 ENCOUNTER — Other Ambulatory Visit: Payer: Medicaid Other

## 2015-02-27 VITALS — BP 156/100 | HR 104 | Wt 377.8 lb

## 2015-02-27 DIAGNOSIS — O289 Unspecified abnormal findings on antenatal screening of mother: Secondary | ICD-10-CM | POA: Diagnosis not present

## 2015-02-27 DIAGNOSIS — O1493 Unspecified pre-eclampsia, third trimester: Secondary | ICD-10-CM | POA: Diagnosis not present

## 2015-02-27 DIAGNOSIS — Z3A36 36 weeks gestation of pregnancy: Secondary | ICD-10-CM | POA: Insufficient documentation

## 2015-02-27 DIAGNOSIS — O285 Abnormal chromosomal and genetic finding on antenatal screening of mother: Secondary | ICD-10-CM

## 2015-02-27 DIAGNOSIS — Z1389 Encounter for screening for other disorder: Secondary | ICD-10-CM

## 2015-02-27 DIAGNOSIS — Z331 Pregnant state, incidental: Secondary | ICD-10-CM

## 2015-02-27 DIAGNOSIS — O133 Gestational [pregnancy-induced] hypertension without significant proteinuria, third trimester: Secondary | ICD-10-CM | POA: Diagnosis not present

## 2015-02-27 DIAGNOSIS — IMO0002 Reserved for concepts with insufficient information to code with codable children: Secondary | ICD-10-CM

## 2015-02-27 DIAGNOSIS — O09893 Supervision of other high risk pregnancies, third trimester: Secondary | ICD-10-CM | POA: Diagnosis not present

## 2015-02-27 LAB — URINALYSIS, ROUTINE W REFLEX MICROSCOPIC
Glucose, UA: NEGATIVE mg/dL
Hgb urine dipstick: NEGATIVE
KETONES UR: 15 mg/dL — AB
NITRITE: NEGATIVE
Protein, ur: 30 mg/dL — AB
UROBILINOGEN UA: 0.2 mg/dL (ref 0.0–1.0)
pH: 6 (ref 5.0–8.0)

## 2015-02-27 LAB — CBC
HCT: 30.2 % — ABNORMAL LOW (ref 36.0–46.0)
HEMOGLOBIN: 10.1 g/dL — AB (ref 12.0–15.0)
MCH: 30.2 pg (ref 26.0–34.0)
MCHC: 33.4 g/dL (ref 30.0–36.0)
MCV: 90.4 fL (ref 78.0–100.0)
Platelets: 201 10*3/uL (ref 150–400)
RBC: 3.34 MIL/uL — ABNORMAL LOW (ref 3.87–5.11)
RDW: 14.8 % (ref 11.5–15.5)
WBC: 8 10*3/uL (ref 4.0–10.5)

## 2015-02-27 LAB — PROTEIN / CREATININE RATIO, URINE
Creatinine, Urine: 355 mg/dL
PROTEIN CREATININE RATIO: 0.17 mg/mg{creat} — AB (ref 0.00–0.15)
Total Protein, Urine: 60 mg/dL

## 2015-02-27 LAB — COMPREHENSIVE METABOLIC PANEL
ALT: 16 U/L (ref 14–54)
ANION GAP: 11 (ref 5–15)
AST: 20 U/L (ref 15–41)
Albumin: 2.9 g/dL — ABNORMAL LOW (ref 3.5–5.0)
Alkaline Phosphatase: 143 U/L — ABNORMAL HIGH (ref 38–126)
BILIRUBIN TOTAL: 0.2 mg/dL — AB (ref 0.3–1.2)
BUN: 9 mg/dL (ref 6–20)
CALCIUM: 9.5 mg/dL (ref 8.9–10.3)
CHLORIDE: 108 mmol/L (ref 101–111)
CO2: 22 mmol/L (ref 22–32)
Creatinine, Ser: 0.69 mg/dL (ref 0.44–1.00)
GFR calc Af Amer: >60 mL/min (ref >60)
GFR calc non Af Amer: >60 mL/min (ref >60)
Glucose, Bld: 91 mg/dL (ref 65–99)
Potassium: 4.1 mmol/L (ref 3.5–5.1)
SODIUM: 141 mmol/L (ref 135–145)
TOTAL PROTEIN: 6.7 g/dL (ref 6.5–8.1)

## 2015-02-27 LAB — POCT URINALYSIS DIPSTICK
Blood, UA: NEGATIVE
GLUCOSE UA: NEGATIVE
KETONES UA: NEGATIVE
Leukocytes, UA: NEGATIVE
NITRITE UA: NEGATIVE
PROTEIN UA: 1

## 2015-02-27 LAB — URINE MICROSCOPIC-ADD ON

## 2015-02-27 MED ORDER — LACTATED RINGERS IV BOLUS (SEPSIS)
1000.0000 mL | Freq: Once | INTRAVENOUS | Status: AC
  Administered 2015-02-27: 1000 mL via INTRAVENOUS

## 2015-02-27 NOTE — Progress Notes (Signed)
Fetal Surveillance Testing today:  Reactive NST   High Risk Pregnancy Diagnosis(es):   Elevated ONTD risk with negative evaluation  G2P1001 [redacted]w[redacted]d Estimated Date of Delivery: 03/26/15  Blood pressure 156/100, pulse 104, weight 377 lb 12.8 oz (171.369 kg), last menstrual period 07/04/2014.  Urinalysis: Negative   HPI: The patient is being seen today for ongoing management of as above elevated ONTD risk with negative work up. Today she reports pelvic pressure back ache swelling   BP weight and urine results all reviewed and noted. Patient reports good fetal movement, denies any bleeding and no rupture of membranes symptoms or regular contractions.  Fundal Height:  U+34(the 34 above reflects U+34) Fetal Heart rate:  140 Edema:  2+  Patient is without complaints other than noted in her HPI. All questions were answered.  All lab and sonogram results have been reviewed. Comments: abnormal: ONTD risk   Assessment:  1.  Pregnancy at [redacted]w[redacted]d,  Estimated Date of Delivery: 03/26/15 :                          2.  Elevated ONTD risk with negative evaluation                        3.  Elevated BP  Medication(s) Plans:  None today  Treatment Plan:  Will go down to [facility] MAU for lab and BP evaluation for possible pre eclampsia, discussed before sending down  Follow up in friday weeks for appointment for high risk OB care, twice weekly assessment

## 2015-02-27 NOTE — MAU Note (Signed)
Instructions given to patient about collecting 24 hour urine and then taking it to office for appointment on Friday. Pt verbalized understanding.

## 2015-02-27 NOTE — MAU Note (Signed)
BP elevated in office today, sent in for further eval.  With last preg, went up after delivery.  Had a HA earlier today, gone now. Increased swelling in lower extremities and hands.  Denies visual changes or epigastric pain.

## 2015-02-27 NOTE — MAU Provider Note (Signed)
Chief Complaint:  Hypertension   First Provider Initiated Contact with Patient 02/27/15 1744     HPI: Kristina Scott is a 24 y.o. G2P1001 at [redacted]w[redacted]d who sent to maternity admissions for preeclampsia eval. BP elevated in office today. Mild HA, resolved spontaneously.  Denies vision changes, epigastric pain, contractions, leakage of fluid or vaginal bleeding. Good fetal movement.   Pregnancy Course:   Patient Active Problem List   Diagnosis Date Noted  . Supervision of normal pregnancy 01/30/2015  . 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 Abnormal findings on antenatal screening   . Abnormal chromosomal and genetic finding on antenatal screening of mother 10/11/2014  . Obesity affecting pregnancy, antepartum 10/09/2014  . Rubella non-immune status, antepartum 09/12/2014  . Anemia of mother in pregnancy, antepartum 09/12/2014  . Supervision of other high-risk pregnancy 09/11/2014   Past Medical History: Past Medical History  Diagnosis Date  . Encounter for Nexplanon removal 12/28/2013  . Contraceptive management 12/28/2013  . Obesity   . Pregnant state, incidental 08/23/2014  . Medical history non-contributory     Past obstetric history: OB History  Gravida Para Term Preterm AB SAB TAB Ectopic Multiple Living  2 1 1       1     # Outcome Date GA Lbr Len/2nd Weight Sex Delivery Anes PTL Lv  2 Current           1 Term 11/29/06 [redacted]w[redacted]d   F Vag-Spont  N Y      Past Surgical History: Past Surgical History  Procedure Laterality Date  . Wisdom tooth extraction       Family History: Family History  Problem Relation Age of Onset  . Stroke Maternal Grandfather     Social History: History  Substance Use Topics  . Smoking status: Former Smoker    Types: Cigarettes  . Smokeless tobacco: Never Used  . Alcohol Use: No    Allergies: No Known Allergies  Meds:  Prescriptions prior to admission  Medication Sig Dispense Refill Last Dose  . acetaminophen (TYLENOL) 325 MG tablet Take 650  mg by mouth every 6 (six) hours as needed for headache.   Past Week at Unknown time  . ferrous sulfate 325 (65 FE) MG tablet Take 1 tablet (325 mg total) by mouth 2 (two) times daily with a meal. 60 tablet 3 02/27/2015 at Unknown time  . prenatal vitamin w/FE, FA (PRENATAL 1 + 1) 27-1 MG TABS tablet Take 1 tablet by mouth daily at 12 noon. 30 each 11 02/27/2015 at Unknown time  . valACYclovir (VALTREX) 500 MG tablet Take 1 tablet (500 mg total) by mouth 2 (two) times daily. 60 tablet 2 02/27/2015 at Unknown time   I have reviewed the PHM/SH/SH/Allergies/Medications and prenatal record.   ROS:  Review of Systems  Constitutional: Negative for fever and chills.  Eyes: Negative for blurred vision.  Gastrointestinal: Negative for abdominal pain.  Genitourinary:       Negative for leaking of fluid or vaginal bleeding. Positive fetal movement  Neurological: Positive for headaches.   Physical Exam  Blood pressure 155/82, pulse 86, temperature 98.8 F (37.1 C), temperature source Oral, resp. rate 20, last menstrual period 07/04/2014.   GENERAL: Well-developed, well-nourished morbidly obese female in no acute distress.  HEART: normal rate RESP: normal effort GI: Abd soft, non-tender, gravid, size greater than dates  MS: Extremities nontender, 1+ edema. NEURO: Alert and oriented x 4.  GU: Deferred   FHT:  Baseline 145 , min-moderate variability, 10x10 accelerations present, 1  mild variable deceleration Contractions: None   Labs: Results for orders placed or performed during the hospital encounter of 02/27/15 (from the past 24 hour(s))  CBC     Status: Abnormal   Collection Time: 02/27/15  5:10 PM  Result Value Ref Range   WBC 8.0 4.0 - 10.5 K/uL   RBC 3.34 (L) 3.87 - 5.11 MIL/uL   Hemoglobin 10.1 (L) 12.0 - 15.0 g/dL   HCT 40.9 (L) 81.1 - 91.4 %   MCV 90.4 78.0 - 100.0 fL   MCH 30.2 26.0 - 34.0 pg   MCHC 33.4 30.0 - 36.0 g/dL   RDW 78.2 95.6 - 21.3 %   Platelets 201 150 - 400 K/uL   Comprehensive metabolic panel     Status: Abnormal   Collection Time: 02/27/15  5:10 PM  Result Value Ref Range   Sodium 141 135 - 145 mmol/L   Potassium 4.1 3.5 - 5.1 mmol/L   Chloride 108 101 - 111 mmol/L   CO2 22 22 - 32 mmol/L   Glucose, Bld 91 65 - 99 mg/dL   BUN 9 6 - 20 mg/dL   Creatinine, Ser 0.86 0.44 - 1.00 mg/dL   Calcium 9.5 8.9 - 57.8 mg/dL   Total Protein 6.7 6.5 - 8.1 g/dL   Albumin 2.9 (L) 3.5 - 5.0 g/dL   AST 20 15 - 41 U/L   ALT 16 14 - 54 U/L   Alkaline Phosphatase 143 (H) 38 - 126 U/L   Total Bilirubin 0.2 (L) 0.3 - 1.2 mg/dL   GFR calc non Af Amer >60 >60 mL/min   GFR calc Af Amer >60 >60 mL/min   Anion gap 11 5 - 15  Protein / creatinine ratio, urine     Status: Abnormal   Collection Time: 02/27/15  5:20 PM  Result Value Ref Range   Creatinine, Urine 355.00 mg/dL   Total Protein, Urine 60 mg/dL   Protein Creatinine Ratio 0.17 (H) 0.00 - 0.15 mg/mg[Cre]  Urinalysis, Routine w reflex microscopic (not at [facility])     Status: Abnormal   Collection Time: 02/27/15  5:20 PM  Result Value Ref Range   Color, Urine YELLOW YELLOW   APPearance HAZY (A) CLEAR   Specific Gravity, Urine >1.030 (H) 1.005 - 1.030   pH 6.0 5.0 - 8.0   Glucose, UA NEGATIVE NEGATIVE mg/dL   Hgb urine dipstick NEGATIVE NEGATIVE   Bilirubin Urine SMALL (A) NEGATIVE   Ketones, ur 15 (A) NEGATIVE mg/dL   Protein, ur 30 (A) NEGATIVE mg/dL   Urobilinogen, UA 0.2 0.0 - 1.0 mg/dL   Nitrite NEGATIVE NEGATIVE   Leukocytes, UA SMALL (A) NEGATIVE  Urine microscopic-add on     Status: Abnormal   Collection Time: 02/27/15  5:20 PM  Result Value Ref Range   Squamous Epithelial / LPF MANY (A) RARE   WBC, UA 3-6 <3 WBC/hpf   Urine-Other MUCOUS PRESENT     Imaging:  BPP 8/8, AFI 14  MAU Course: CBC, CMP, UA, protein creatinine ratio, cycle blood pressures. NST nonreactive. BPP ordered.  MDM 24 year old female sent from the office with elevated blood pressures for preeclampsia evaluation. Labs  normal. Blood pressure elevated but no severe range blood pressures. NST nonreactive in maternity admissions. BPP 8/8, AFI normal. No indication for delivery at this time. Already in antenatal testing for abnormal genetic screening test. Continue to observe blood pressure closely. Discussed blood pressure, labs, headache, fetal heart rate tracing and BPP with Dr. Debroah Loop. Agrees with  plan of care.  Assessment: 1. Gestational hypertension without significant proteinuria in third trimester   2. Fetal heart rate nonreactive   3. [redacted] weeks gestation of pregnancy   4.      Abnormal chromosomal screening on antenatal screening of mother  Plan: Discharge home in stable condition.  Preeclampsia precautions. Preterm labor precautions and fetal kick counts Bring 24-hour urine to [facility]-up appointment [facility] per consult with Dr. Debroah Loop.     Follow-up Information    Follow up with [facility] On 03/02/2015.   Why:  Routine prenatal visit or sooner as needed if symptoms worsen   Contact information:   [address] [place] [address] [phone]      Follow up with [facility] [place] MATERNITY ADMISSIONS.   Why:  As needed if symptoms worsen   Contact information:   [address] [id number] m[place] [address] [phone]        Medication List    TAKE these medications        acetaminophen 325 MG tablet  Commonly known as:  TYLENOL  Take 650 mg by mouth every 6 (six) hours as needed for headache.     ferrous sulfate 325 (65 FE) MG tablet  Take 1 tablet (325 mg total) by mouth 2 (two) times daily with a meal.     prenatal vitamin w/FE, FA 27-1 MG Tabs tablet  Take 1 tablet by mouth daily at 12 noon.     valACYclovir 500 MG tablet  Commonly known as:  VALTREX  Take 1 tablet (500 mg total) by mouth 2 (two) times daily.        [place], [place] 02/27/2015 8:19 PM

## 2015-02-27 NOTE — Discharge Instructions (Signed)
24-Hour Urine Collection HOME CARE  When you get up in the morning on the day you do this test, pee (urinate) in the toilet and flush. Make a note of the time. This will be your start time on the day of collection and the end time on the next morning.  From then on, save all your pee (urine) in the plastic jug that was given to you.  You should stop collecting your pee 24 hours after you started.  If the plastic jug that is given to you already has liquid in it, that is okay. Do not throw out the liquid or rinse out the jug. Some tests need the liquid to be added to your pee.  Keep your plastic jug cool (in an ice chest or the refrigerator) during the test.  When the 24 hours is over, bring your plastic jug to the clinic lab. Keep the jug cool (in an ice chest) while you are bringing it to the lab. Document Released: 10/03/2008 Document Revised: 09/29/2011 Document Reviewed: 10/03/2008 [facility] Patient Information 2015 [place], [place]. This information is not intended to replace advice given to you by your health care provider. Make sure you discuss any questions you have with your health care provider.  Hypertension During Pregnancy Hypertension, or high blood pressure, is when there is extra pressure inside your blood vessels that carry blood from the heart to the rest of your body (arteries). It can happen at any time in life, including pregnancy. Hypertension during pregnancy can cause problems for you and your baby. Your baby might not weigh as much as he or she should at birth or might be born early (premature). Very bad cases of hypertension during pregnancy can be life-threatening.  Different types of hypertension can occur during pregnancy. These include:  Chronic hypertension. This happens when a woman has hypertension before pregnancy and it continues during pregnancy.  Gestational hypertension. This is when hypertension develops during pregnancy.  Preeclampsia or toxemia of  pregnancy. This is a very serious type of hypertension that develops only during pregnancy. It affects the whole body and can be very dangerous for both mother and baby.  Gestational hypertension and preeclampsia usually go away after your baby is born. Your blood pressure will likely stabilize within 6 weeks. Women who have hypertension during pregnancy have a greater chance of developing hypertension later in life or with future pregnancies. RISK FACTORS There are certain factors that make it more likely for you to develop hypertension during pregnancy. These include:  Having hypertension before pregnancy.  Having hypertension during a previous pregnancy.  Being overweight.  Being older than 40 years.  Being pregnant with more than one baby.  Having diabetes or kidney problems. SIGNS AND SYMPTOMS Chronic and gestational hypertension rarely cause symptoms. Preeclampsia has symptoms, which may include:  Increased protein in your urine. Your health care provider will check for this at every prenatal visit.  Swelling of your hands and face.  Rapid weight gain.  Headaches.  Visual changes.  Being bothered by light.  Abdominal pain, especially in the upper right area.  Chest pain.  Shortness of breath.  Increased reflexes.  Seizures. These occur with a more severe form of preeclampsia, called eclampsia. DIAGNOSIS  You may be diagnosed with hypertension during a regular prenatal exam. At each prenatal visit, you may have:  Your blood pressure checked.  A urine test to check for protein in your urine. The type of hypertension you are diagnosed with depends on when  you developed it. It also depends on your specific blood pressure reading.  Developing hypertension before 20 weeks of pregnancy is consistent with chronic hypertension.  Developing hypertension after 20 weeks of pregnancy is consistent with gestational hypertension.  Hypertension with increased urinary  protein is diagnosed as preeclampsia.  Blood pressure measurements that stay above 160 systolic or 110 diastolic are a sign of severe preeclampsia. TREATMENT Treatment for hypertension during pregnancy varies. Treatment depends on the type of hypertension and how serious it is.  If you take medicine for chronic hypertension, you may need to switch medicines.  Medicines called ACE inhibitors should not be taken during pregnancy.  Low-dose aspirin may be suggested for women who have risk factors for preeclampsia.  If you have gestational hypertension, you may need to take a blood pressure medicine that is safe during pregnancy. Your health care provider will recommend the correct medicine.  If you have severe preeclampsia, you may need to be in the hospital. Health care providers will watch you and your baby very closely. You also may need to take medicine called magnesium sulfate to prevent seizures and lower blood pressure.  Sometimes, an early delivery is needed. This may be the case if the condition worsens. It would be done to protect you and your baby. The only cure for preeclampsia is delivery.  Your health care provider may recommend that you take one low-dose aspirin (81 mg) each day to help prevent high blood pressure during your pregnancy if you are at risk for preeclampsia. You may be at risk for preeclampsia if:  You had preeclampsia or eclampsia during a previous pregnancy.  Your baby did not grow as expected during a previous pregnancy.  You experienced preterm birth with a previous pregnancy.  You experienced a separation of the placenta from the uterus (placental abruption) during a previous pregnancy.  You experienced the loss of your baby during a previous pregnancy.  You are pregnant with more than one baby.  You have other medical conditions, such as diabetes or an autoimmune disease. HOME CARE INSTRUCTIONS  Schedule and keep all of your regular prenatal care  appointments. This is important.  Take medicines only as directed by your health care provider. Tell your health care provider about all medicines you take.  Eat as little salt as possible.  Get regular exercise.  Do not drink alcohol.  Do not use tobacco products.  Do not drink products with caffeine.  Lie on your left side when resting. SEEK IMMEDIATE MEDICAL CARE IF:  You have severe abdominal pain.  You have sudden swelling in your hands, ankles, or face.  You gain 4 pounds (1.8 kg) or more in 1 week.  You vomit repeatedly.  You have vaginal bleeding.  You do not feel your baby moving as much.  You have a headache.  You have blurred or double vision.  You have muscle twitching or spasms.  You have shortness of breath.  You have blue fingernails or lips.  You have blood in your urine. MAKE SURE YOU:  Understand these instructions.  Will watch your condition.  Will get help right away if you are not doing well or get worse. Document Released: 03/25/2011 Document Revised: 11/21/2013 Document Reviewed: 02/03/2013 [facility] Patient Information 2015 [place], [place]. This information is not intended to replace advice given to you by your health care provider. Make sure you discuss any questions you have with your health care provider.

## 2015-02-27 NOTE — MAU Note (Signed)
Ivonne Andrew CNM aware that BPP is 8/8 as per ultrasound tech. Monitor d/c and IV to be taken out when patient arrives back from ultrasound.

## 2015-03-02 ENCOUNTER — Ambulatory Visit (INDEPENDENT_AMBULATORY_CARE_PROVIDER_SITE_OTHER): Payer: Medicaid Other | Admitting: Obstetrics & Gynecology

## 2015-03-02 ENCOUNTER — Other Ambulatory Visit: Payer: Medicaid Other

## 2015-03-02 ENCOUNTER — Encounter: Payer: Self-pay | Admitting: Obstetrics & Gynecology

## 2015-03-02 VITALS — BP 116/78 | HR 88 | Wt 378.8 lb

## 2015-03-02 DIAGNOSIS — Z369 Encounter for antenatal screening, unspecified: Secondary | ICD-10-CM

## 2015-03-02 DIAGNOSIS — Z331 Pregnant state, incidental: Secondary | ICD-10-CM | POA: Diagnosis not present

## 2015-03-02 DIAGNOSIS — O289 Unspecified abnormal findings on antenatal screening of mother: Secondary | ICD-10-CM | POA: Diagnosis not present

## 2015-03-02 DIAGNOSIS — O09893 Supervision of other high risk pregnancies, third trimester: Secondary | ICD-10-CM | POA: Diagnosis not present

## 2015-03-02 DIAGNOSIS — O1210 Gestational proteinuria, unspecified trimester: Secondary | ICD-10-CM | POA: Diagnosis not present

## 2015-03-02 DIAGNOSIS — O285 Abnormal chromosomal and genetic finding on antenatal screening of mother: Secondary | ICD-10-CM

## 2015-03-02 DIAGNOSIS — Z1389 Encounter for screening for other disorder: Secondary | ICD-10-CM | POA: Diagnosis not present

## 2015-03-02 DIAGNOSIS — O280 Abnormal hematological finding on antenatal screening of mother: Secondary | ICD-10-CM

## 2015-03-02 LAB — POCT URINALYSIS DIPSTICK
GLUCOSE UA: NEGATIVE
Ketones, UA: NEGATIVE
Nitrite, UA: NEGATIVE

## 2015-03-02 LAB — OB RESULTS CONSOLE GBS: GBS: POSITIVE

## 2015-03-02 LAB — OB RESULTS CONSOLE GC/CHLAMYDIA
CHLAMYDIA, DNA PROBE: NEGATIVE
Gonorrhea: NEGATIVE

## 2015-03-02 NOTE — Addendum Note (Signed)
Addended by: Criss Alvine on: 03/02/2015 12:10 PM   Modules accepted: Orders

## 2015-03-02 NOTE — Progress Notes (Signed)
Fetal Surveillance Testing today:  Reactive NST   High Risk Pregnancy Diagnosis(es):   Elevated ONTD risk with normal eval  G2P1001 [redacted]w[redacted]d Estimated Date of Delivery: 03/26/15  Blood pressure 116/78, pulse 88, weight 378 lb 12.8 oz (171.823 kg), last menstrual period 07/04/2014.  Urinalysis: Positive for 3+ protein(pr/cr ratio 2 days ago 0.19)   HPI: The patient is being seen today for ongoing management of elevated ONTD risk. Today she reports swelling of feet and hands with some reflux and occsional SOB BP ok today protein noted    BP weight and urine results all reviewed and noted. Patient reports good fetal movement, denies any bleeding and no rupture of membranes symptoms or regular contractions.  Fundal Height:  U+34 cm Fetal Heart rate:  145 Edema:  3+  Patient is without complaints other than noted in her HPI. All questions were answered.  All lab and sonogram results have been reviewed. Comments: abnormal: 3+ protin   Assessment:  1.  Pregnancy at [redacted]w[redacted]d,  Estimated Date of Delivery: 03/26/15 :                          2.  Elevated ONTD risk                        3.  isolted proteinuria, 24 hour pending  Medication(s) Plans:  No changes  Treatment Plan:  24 hour urine completed today, cont twice weekly assessment  Follow up in tuesday weeks for appointment for high risk OB care, nst  Chief Complaint  Patient presents with  . High Risk Gestation    NST; GC/CHL; short of breath and sometimes has chest pains; swelling in hands and feet

## 2015-03-03 LAB — PROTEIN, URINE, 24 HOUR
PROTEIN 24H UR: 1384.6 mg/(24.h) — AB (ref 30.0–150.0)
PROTEIN UR: 60.2 mg/dL

## 2015-03-04 LAB — STREP GP B NAA: STREP GROUP B AG: POSITIVE — AB

## 2015-03-06 ENCOUNTER — Ambulatory Visit (INDEPENDENT_AMBULATORY_CARE_PROVIDER_SITE_OTHER): Payer: Medicaid Other | Admitting: Obstetrics & Gynecology

## 2015-03-06 ENCOUNTER — Inpatient Hospital Stay (HOSPITAL_COMMUNITY)
Admission: AD | Admit: 2015-03-06 | Discharge: 2015-03-09 | DRG: 774 | Disposition: A | Discharge status: Home / Self Care | Payer: Medicaid Other | Source: Ambulatory Visit | Attending: Family Medicine | Admitting: Family Medicine

## 2015-03-06 ENCOUNTER — Encounter (HOSPITAL_COMMUNITY): Payer: Self-pay | Admitting: *Deleted

## 2015-03-06 ENCOUNTER — Encounter: Payer: Self-pay | Admitting: Obstetrics & Gynecology

## 2015-03-06 VITALS — BP 130/100 | HR 76 | Wt 375.8 lb

## 2015-03-06 DIAGNOSIS — O99824 Streptococcus B carrier state complicating childbirth: Secondary | ICD-10-CM | POA: Diagnosis present

## 2015-03-06 DIAGNOSIS — O09893 Supervision of other high risk pregnancies, third trimester: Secondary | ICD-10-CM

## 2015-03-06 DIAGNOSIS — O9902 Anemia complicating childbirth: Secondary | ICD-10-CM | POA: Diagnosis present

## 2015-03-06 DIAGNOSIS — O1403 Mild to moderate pre-eclampsia, third trimester: Secondary | ICD-10-CM | POA: Diagnosis present

## 2015-03-06 DIAGNOSIS — O1400 Mild to moderate pre-eclampsia, unspecified trimester: Secondary | ICD-10-CM | POA: Diagnosis present

## 2015-03-06 DIAGNOSIS — Z3A37 37 weeks gestation of pregnancy: Secondary | ICD-10-CM | POA: Diagnosis present

## 2015-03-06 DIAGNOSIS — Z6841 Body Mass Index (BMI) 40.0 and over, adult: Secondary | ICD-10-CM

## 2015-03-06 DIAGNOSIS — Z87891 Personal history of nicotine dependence: Secondary | ICD-10-CM | POA: Diagnosis not present

## 2015-03-06 DIAGNOSIS — O1493 Unspecified pre-eclampsia, third trimester: Secondary | ICD-10-CM | POA: Diagnosis present

## 2015-03-06 DIAGNOSIS — Z331 Pregnant state, incidental: Secondary | ICD-10-CM

## 2015-03-06 DIAGNOSIS — Z1389 Encounter for screening for other disorder: Secondary | ICD-10-CM | POA: Diagnosis not present

## 2015-03-06 DIAGNOSIS — Z823 Family history of stroke: Secondary | ICD-10-CM

## 2015-03-06 DIAGNOSIS — O99013 Anemia complicating pregnancy, third trimester: Secondary | ICD-10-CM

## 2015-03-06 DIAGNOSIS — D649 Anemia, unspecified: Secondary | ICD-10-CM | POA: Diagnosis present

## 2015-03-06 DIAGNOSIS — Z3493 Encounter for supervision of normal pregnancy, unspecified, third trimester: Secondary | ICD-10-CM

## 2015-03-06 DIAGNOSIS — O9989 Other specified diseases and conditions complicating pregnancy, childbirth and the puerperium: Secondary | ICD-10-CM

## 2015-03-06 DIAGNOSIS — O285 Abnormal chromosomal and genetic finding on antenatal screening of mother: Secondary | ICD-10-CM

## 2015-03-06 DIAGNOSIS — Z283 Underimmunization status: Secondary | ICD-10-CM

## 2015-03-06 DIAGNOSIS — R768 Other specified abnormal immunological findings in serum: Secondary | ICD-10-CM

## 2015-03-06 DIAGNOSIS — O99214 Obesity complicating childbirth: Secondary | ICD-10-CM | POA: Diagnosis present

## 2015-03-06 DIAGNOSIS — O09899 Supervision of other high risk pregnancies, unspecified trimester: Secondary | ICD-10-CM

## 2015-03-06 LAB — COMPREHENSIVE METABOLIC PANEL
ALT: 15 U/L (ref 14–54)
AST: 22 U/L (ref 15–41)
Albumin: 2.9 g/dL — ABNORMAL LOW (ref 3.5–5.0)
Alkaline Phosphatase: 165 U/L — ABNORMAL HIGH (ref 38–126)
Anion gap: 10 (ref 5–15)
BILIRUBIN TOTAL: 0.7 mg/dL (ref 0.3–1.2)
BUN: 10 mg/dL (ref 6–20)
CHLORIDE: 109 mmol/L (ref 101–111)
CO2: 21 mmol/L — ABNORMAL LOW (ref 22–32)
Calcium: 9.6 mg/dL (ref 8.9–10.3)
Creatinine, Ser: 0.76 mg/dL (ref 0.44–1.00)
Glucose, Bld: 63 mg/dL — ABNORMAL LOW (ref 65–99)
POTASSIUM: 4.3 mmol/L (ref 3.5–5.1)
Sodium: 140 mmol/L (ref 135–145)
TOTAL PROTEIN: 6.5 g/dL (ref 6.5–8.1)

## 2015-03-06 LAB — CBC
HCT: 31.8 % — ABNORMAL LOW (ref 36.0–46.0)
Hemoglobin: 11 g/dL — ABNORMAL LOW (ref 12.0–15.0)
MCH: 31.3 pg (ref 26.0–34.0)
MCHC: 34.6 g/dL (ref 30.0–36.0)
MCV: 90.3 fL (ref 78.0–100.0)
PLATELETS: 187 10*3/uL (ref 150–400)
RBC: 3.52 MIL/uL — AB (ref 3.87–5.11)
RDW: 14.8 % (ref 11.5–15.5)
WBC: 9.2 10*3/uL (ref 4.0–10.5)

## 2015-03-06 LAB — POCT URINALYSIS DIPSTICK
Blood, UA: NEGATIVE
GLUCOSE UA: NEGATIVE
Ketones, UA: NEGATIVE
LEUKOCYTES UA: NEGATIVE
Nitrite, UA: NEGATIVE
Protein, UA: 3

## 2015-03-06 LAB — OB RESULTS CONSOLE HIV ANTIBODY (ROUTINE TESTING): HIV: NONREACTIVE

## 2015-03-06 LAB — PROTEIN / CREATININE RATIO, URINE
CREATININE, URINE: 384 mg/dL
Protein Creatinine Ratio: 1.54 mg/mg{Cre} — ABNORMAL HIGH (ref 0.00–0.15)
TOTAL PROTEIN, URINE: 590 mg/dL

## 2015-03-06 LAB — GC/CHLAMYDIA PROBE AMP
CHLAMYDIA, DNA PROBE: NEGATIVE
Neisseria gonorrhoeae by PCR: NEGATIVE

## 2015-03-06 LAB — TYPE AND SCREEN
ABO/RH(D): AB POS
ANTIBODY SCREEN: NEGATIVE

## 2015-03-06 MED ORDER — LACTATED RINGERS IV SOLN
INTRAVENOUS | Status: DC
  Administered 2015-03-07: 11:00:00 via INTRAVENOUS

## 2015-03-06 MED ORDER — OXYTOCIN BOLUS FROM INFUSION: 500.0000 mL | INTRAVENOUS | Status: DC

## 2015-03-06 MED ORDER — OXYCODONE-ACETAMINOPHEN 5-325 MG PO TABS: 2.0000 | ORAL_TABLET | ORAL | Status: DC | PRN

## 2015-03-06 MED ORDER — CITRIC ACID-SODIUM CITRATE 334-500 MG/5ML PO SOLN: 30.0000 mL | ORAL | Status: DC | PRN

## 2015-03-06 MED ORDER — MISOPROSTOL 50MCG HALF TABLET
50.0000 ug | ORAL_TABLET | ORAL | Status: DC | PRN
  Administered 2015-03-06 – 2015-03-07 (×3): 50 ug via ORAL
  Filled 2015-03-06 (×3): qty 0.5

## 2015-03-06 MED ORDER — LACTATED RINGERS IV SOLN
500.0000 mL | INTRAVENOUS | Status: DC | PRN
Start: 2015-03-06 — End: 2015-03-07
  Administered 2015-03-07: 1000 mL via INTRAVENOUS

## 2015-03-06 MED ORDER — OXYTOCIN 40 UNITS IN LACTATED RINGERS INFUSION - SIMPLE MED
62.5000 mL/h | INTRAVENOUS | Status: DC
  Administered 2015-03-07: 62.5 mL/h via INTRAVENOUS
  Administered 2015-03-07: 999 mL/h via INTRAVENOUS

## 2015-03-06 MED ORDER — OXYCODONE-ACETAMINOPHEN 5-325 MG PO TABS: 1.0000 | ORAL_TABLET | ORAL | Status: DC | PRN

## 2015-03-06 MED ORDER — LIDOCAINE HCL (PF) 1 % IJ SOLN
30.0000 mL | INTRAMUSCULAR | Status: DC | PRN
  Filled 2015-03-06: qty 30

## 2015-03-06 MED ORDER — ACETAMINOPHEN 325 MG PO TABS
650.0000 mg | ORAL_TABLET | ORAL | Status: DC | PRN
  Administered 2015-03-07: 650 mg via ORAL
  Filled 2015-03-06: qty 2

## 2015-03-06 MED ORDER — ONDANSETRON HCL 4 MG/2ML IJ SOLN: 4.0000 mg | Freq: Four times a day (QID) | INTRAMUSCULAR | Status: DC | PRN

## 2015-03-06 NOTE — Progress Notes (Signed)
Dr Natale Milch called - informed that pt was in room 163

## 2015-03-06 NOTE — H&P (Signed)
LABOR ADMISSION HISTORY AND PHYSICAL  Kristina Scott is a 24 y.o. female G2P1001 with IUP at [redacted]w[redacted]d by 7wk u/s presenting for IOL 2/2 preeclampsia without severe features.  She was at a clinic visit and ntoed to have elevated BP with proteinuria and  of protein on 24hr  She reports +FMs, No LOF, no VB, no blurry vision, headaches or peripheral edema, and RUQ pain.  She plans on formula feeding. She request Nexplanon for birth control.  Dating: By U/S on 08/28/14 --->  Estimated Date of Delivery: 03/26/15  Sono:  08/28/14 - dating , Nuchal translucency , Anatomy scan, normal. Normal spine, no open  , Cephalic, 1580g 81.4%,fht 122bpm,polyhydramnios 20.7cm,cephalic,fundal pl gr 2,limited view of ov's  [place] 32+1wks,efw 2556g 84.7%,cephalic,post fundal pl gr 3,normal ov's bilat,afi 18cm,BPP 8/8,FHT 149BPM , 2556g 84.7%th  Prenatal History/Complications: [facility]  Initiated Care at  12.0wks  FOB Fredirick [place]audlin, 22yo, 1st baby, just got married 09/08/14  Dating By 10wk u/s  Pap 08/23/14: neg  GC/CT Initial: -/- 36+wks:  Genetic Screen NT/IT: + for OSB 1:93  CF screen neg  Anatomic [place] Normal female 'Landon'  Flu vaccine 09/11/14  Tdap Recommended ~ 28wks  Glucose Screen  2 hr 74/118/71  GBS POS  Feed Preference bottle  Contraception Nexplanon  Circumcision Yes, at FT  Childbirth Classes Interested, info given  Pediatrician [place]     Past Medical History: Past Medical History  Diagnosis Date  . Encounter for Nexplanon removal 12/28/2013  . Contraceptive management 12/28/2013  . Obesity   . Pregnant state, incidental 08/23/2014  . Medical history non-contributory     Past Surgical History: Past Surgical History  Procedure Laterality Date  . Wisdom tooth extraction      Obstetrical History: OB History    Gravida Para Term Preterm AB TAB SAB Ectopic Multiple Living   Social History: Social History    Social History  . Marital Status: Single    Spouse Name: N/A  . Number of Children: N/A  . Years of Education: N/A   Social History Main Topics  . Smoking status: Former Smoker    Types: Cigarettes  . Smokeless tobacco: Never Used  . Alcohol Use: No  . Drug Use: No  . Sexual Activity: Yes    Birth Control/ Protection: None   Other Topics Concern  . None   Social History Narrative    Family History: Family History  Problem Relation Age of Onset  . Stroke Maternal Grandfather     Allergies: No Known Allergies  Prescriptions prior to admission  Medication Sig Dispense Refill Last Dose  . acetaminophen (TYLENOL) 325 MG tablet Take 650 mg by mouth every 6 (six) hours as needed for headache.   03/05/2015 at Unknown time  . ferrous sulfate 325 (65 FE) MG tablet Take 1 tablet (325 mg total) by mouth 2 (two) times daily with a meal. 60 tablet 3 03/05/2015 at 0900  . prenatal vitamin w/FE, FA (PRENATAL 1 + 1) 27-1 MG TABS tablet Take 1 tablet by mouth daily at 12 noon. 30 each 11 03/05/2015 at 1200  . valACYclovir (VALTREX) 500 MG tablet Take 1 tablet (500 mg total) by mouth 2 (two) times daily. 60 tablet 2 03/05/2015 at 0900     Review of Systems  All systems reviewed and negative except as stated in HPI  Blood pressure 148/99, pulse 85, temperature 98.9 F (37.2  C), temperature source Oral, resp. rate 20, height 5\' 4"  (1.626 m), weight 375 lb (170.099 kg), last menstrual period 07/04/2014.  Body mass index is 64.34 kg/(m^2).   General appearance: alert and cooperative, Obese Lungs: clear to auscultation bilaterally Heart: regular rate and rhythm Abdomen: soft, non-tender; bowel sounds normal Pelvic: adequate Extremities: Homans sign is negative, no sign of DVT Presentation: cephalic Fetal monitoring: Baseline: 145 bpm, Variability: Good {> 6 bpm), Accelerations: Reactive and Decelerations: Absent Uterine activity: None  Dilation: Fingertip Effacement (%):  Thick Station: -3 Presentation: Vertex Exam by:: dr Alvester Morin  Prenatal labs: ABO, Rh: AB/--/-- (02/22 1533) Antibody: Negative (06/14 0902) Rubella:  Non-immune RPR: Non Reactive (06/14 0902)  HBsAg: Negative (02/22 1533)  HIV:   NR GBS:   POS 2 hr glucola 74/118/71 Genetic screening high risk for ONTD 1:93 Anatomy US wnl  Prenatal Transfer Tool  Maternal Diabetes: No Genetic Screening: Abnormal:  Results: Elevated AFP Maternal Ultrasounds/Referrals: Normal Fetal Ultrasounds or other Referrals:  None Maternal Substance Abuse:  No Significant Maternal Medications:  None Significant Maternal Lab Results: Lab values include: Group B Strep positive  Results for orders placed or performed in visit on 03/06/15 (from the past 24 hour(s))  POCT urinalysis dipstick   Collection Time: 03/06/15  2:02 PM  Result Value Ref Range   Color, UA     Clarity, UA     Glucose, UA neg    Bilirubin, UA     Ketones, UA neg    Spec Grav, UA     Blood, UA neg    pH, UA     Protein, UA 3    Urobilinogen, UA     Nitrite, UA neg    Leukocytes, UA Negative Negative    Patient Active Problem List   Diagnosis Date Noted  . Mild preeclampsia 03/06/2015  . Supervision of normal pregnancy 01/30/2015  . 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 Abnormal findings on antenatal screening   . Abnormal chromosomal and genetic finding on antenatal screening of mother 10/11/2014  . Obesity affecting pregnancy, antepartum 10/09/2014  . Rubella non-immune status, antepartum 09/12/2014  . Anemia of mother in pregnancy, antepartum 09/12/2014  . Supervision of other high-risk pregnancy 09/11/2014    Assessment: Kristina Scott is a 24 y.o. G2P1001 at [redacted]w[redacted]d here for IOL 2/2 preeclampsia without severe features.  #Labor: IOL 2/2 to preeclampsia without severe features. Plan for cytotec for cervical ripening then FB. Pitocin when appropriate. #Pain: Unsure, considering IV meds but open to epidural #FWB: Cat 1,  reactive. Ok for intermittent monitoring 20 min NST then 1 hour off while in latent labor.  #ID: GBS POS- PCN #MOF: Bottle #MOC: Nexplanon #Circ:  Desired, planned outpatient  #Preeclampsia without severe features: Risk factors for development with 1:93 ONTD. BP 150/100s.  -Will treat if develops severe range (>160/110).   -Monitor BP per unit protocol  #Anemia: continue ferrous sulfate PP.   Indication for IOL:  preeclampsia without severe features We discussed reason for induction today. Reviewed risks, benefits and alternatives.  Discussed in detail the methods for cervical ripening including but not limited to cytotec and foley bulb. We discussed use of pitocin to help intensify contractions. Reviewed possibility of AROM if necessary.  Patient asked appropriate questions and they were answered. Patient agreed to induction.  Isa Rankin [facility] 03/06/2015, 5:55 PM

## 2015-03-06 NOTE — Progress Notes (Signed)
Cervical exam from last week: 0.5/th/-3  Fetal Surveillance Testing today:  Reactive NST   High Risk Pregnancy Diagnosis(es):   Elevated ONTD risk, mile pre eclampsia  G2P1001 [redacted]w[redacted]d Estimated Date of Delivery: 03/26/15  Blood pressure 130/100, pulse 76, weight 375 lb 12.8 oz (170.462 kg), last menstrual period 07/04/2014.  Urinalysis: Positive for 3+ protein   HPI: The patient is being seen today for ongoing management of elevated ONTD risk and pre eclampsia. Today she reports no CNS symptoms, no contractions   BP weight and urine results all reviewed and noted. Patient reports good fetal movement, denies any bleeding and no rupture of membranes symptoms or regular contractions.  Fundal Height:  U+36 Fetal Heart rate:  140 Edema:  3+  Patient is without complaints other than noted in her HPI. All questions were answered.  All lab and sonogram results have been reviewed. Comments: abnormal: ONTD risk 1385 grams in 24 hours  Assessment:  1.  Pregnancy at [redacted]w[redacted]d,  Estimated Date of Delivery: 03/26/15 :                          2.  Mild pre eclapmsia                        3.  Elevated ONTD risk  Medication(s) Plans:  No changes  Treatment Plan:  Discussed with Dr Alvester Morin:  Pt to go to [facility] L[facility] for direct admission for cervical ripening and labor induction for diagnosis of pre eclampsia, mild, now at 37 weeks Pt expects process to take 2 days of so.  NST reactive today  Follow up in 6 weeks for appointment for high risk OB care, pp care

## 2015-03-06 NOTE — Progress Notes (Signed)
Dr Natale Milch on phone - made aware that pts b/p 155/100 - she will let dr Alvester Morin know

## 2015-03-07 ENCOUNTER — Encounter (HOSPITAL_COMMUNITY): Payer: Self-pay | Admitting: *Deleted

## 2015-03-07 ENCOUNTER — Inpatient Hospital Stay (HOSPITAL_COMMUNITY): Payer: Medicaid Other | Admitting: Anesthesiology

## 2015-03-07 DIAGNOSIS — O1400 Mild to moderate pre-eclampsia, unspecified trimester: Secondary | ICD-10-CM

## 2015-03-07 DIAGNOSIS — Z6841 Body Mass Index (BMI) 40.0 and over, adult: Secondary | ICD-10-CM

## 2015-03-07 DIAGNOSIS — O1403 Mild to moderate pre-eclampsia, third trimester: Secondary | ICD-10-CM

## 2015-03-07 DIAGNOSIS — Z3A37 37 weeks gestation of pregnancy: Secondary | ICD-10-CM

## 2015-03-07 DIAGNOSIS — O99824 Streptococcus B carrier state complicating childbirth: Secondary | ICD-10-CM

## 2015-03-07 LAB — COMPREHENSIVE METABOLIC PANEL
ALK PHOS: 181 U/L — AB (ref 38–126)
ALT: 15 U/L (ref 14–54)
ANION GAP: 9 (ref 5–15)
AST: 20 U/L (ref 15–41)
Albumin: 3 g/dL — ABNORMAL LOW (ref 3.5–5.0)
BUN: 10 mg/dL (ref 6–20)
CALCIUM: 9.4 mg/dL (ref 8.9–10.3)
CO2: 21 mmol/L — AB (ref 22–32)
CREATININE: 0.72 mg/dL (ref 0.44–1.00)
Chloride: 104 mmol/L (ref 101–111)
Glucose, Bld: 88 mg/dL (ref 65–99)
Potassium: 4.1 mmol/L (ref 3.5–5.1)
SODIUM: 134 mmol/L — AB (ref 135–145)
Total Bilirubin: 0.6 mg/dL (ref 0.3–1.2)
Total Protein: 7.4 g/dL (ref 6.5–8.1)

## 2015-03-07 LAB — CBC
HCT: 35 % — ABNORMAL LOW (ref 36.0–46.0)
Hemoglobin: 12.1 g/dL (ref 12.0–15.0)
MCH: 31.3 pg (ref 26.0–34.0)
MCHC: 34.6 g/dL (ref 30.0–36.0)
MCV: 90.4 fL (ref 78.0–100.0)
PLATELETS: 216 10*3/uL (ref 150–400)
RBC: 3.87 MIL/uL (ref 3.87–5.11)
RDW: 14.8 % (ref 11.5–15.5)
WBC: 13.5 10*3/uL — ABNORMAL HIGH (ref 4.0–10.5)

## 2015-03-07 LAB — ABO/RH: ABO/RH(D): AB POS

## 2015-03-07 LAB — HIV ANTIBODY (ROUTINE TESTING W REFLEX): HIV SCREEN 4TH GENERATION: NONREACTIVE

## 2015-03-07 LAB — MRSA PCR SCREENING: MRSA by PCR: NEGATIVE

## 2015-03-07 LAB — RPR: RPR: NONREACTIVE

## 2015-03-07 MED ORDER — PHENYLEPHRINE 40 MCG/ML (10ML) SYRINGE FOR IV PUSH (FOR BLOOD PRESSURE SUPPORT)
PREFILLED_SYRINGE | INTRAVENOUS | Status: DC
Start: 2015-03-07 — End: 2015-03-07
  Filled 2015-03-07: qty 20

## 2015-03-07 MED ORDER — LIDOCAINE HCL (PF) 1 % IJ SOLN
INTRAMUSCULAR | Status: DC | PRN
  Administered 2015-03-07: 3 mL via EPIDURAL
  Administered 2015-03-07: 2 mL via EPIDURAL
  Administered 2015-03-07: 5 mL via EPIDURAL

## 2015-03-07 MED ORDER — DIPHENHYDRAMINE HCL 25 MG PO CAPS: 25.0000 mg | ORAL_CAPSULE | Freq: Four times a day (QID) | ORAL | Status: DC | PRN

## 2015-03-07 MED ORDER — TERBUTALINE SULFATE 1 MG/ML IJ SOLN: 0.2500 mg | Freq: Once | INTRAMUSCULAR | Status: DC | PRN

## 2015-03-07 MED ORDER — FENTANYL 2.5 MCG/ML BUPIVACAINE 1/10 % EPIDURAL INFUSION (WH - ANES)
14.0000 mL/h | INTRAMUSCULAR | Status: DC | PRN
  Administered 2015-03-07: 14 mL/h via EPIDURAL

## 2015-03-07 MED ORDER — DIPHENHYDRAMINE HCL 50 MG/ML IJ SOLN: 12.5000 mg | INTRAMUSCULAR | Status: DC | PRN

## 2015-03-07 MED ORDER — PENICILLIN G POTASSIUM 5000000 UNITS IJ SOLR
5.0000 10*6.[IU] | Freq: Once | INTRAVENOUS | Status: AC
  Administered 2015-03-07: 5 10*6.[IU] via INTRAVENOUS
  Filled 2015-03-07: qty 5

## 2015-03-07 MED ORDER — BENZOCAINE-MENTHOL 20-0.5 % EX AERO: 1.0000 "application " | INHALATION_SPRAY | CUTANEOUS | Status: DC | PRN

## 2015-03-07 MED ORDER — DEXTROSE 5 % IV SOLN
2.5000 10*6.[IU] | INTRAVENOUS | Status: DC
  Filled 2015-03-07 (×6): qty 2.5

## 2015-03-07 MED ORDER — FUROSEMIDE 10 MG/ML IJ SOLN
20.0000 mg | Freq: Once | INTRAMUSCULAR | Status: AC
Start: 2015-03-07 — End: 2015-03-07
  Administered 2015-03-07: 20 mg via INTRAVENOUS
  Filled 2015-03-07 (×2): qty 2

## 2015-03-07 MED ORDER — FENTANYL 2.5 MCG/ML BUPIVACAINE 1/10 % EPIDURAL INFUSION (WH - ANES)
INTRAMUSCULAR | Status: AC
  Filled 2015-03-07: qty 125

## 2015-03-07 MED ORDER — ONDANSETRON HCL 4 MG/2ML IJ SOLN: 4.0000 mg | INTRAMUSCULAR | Status: DC | PRN

## 2015-03-07 MED ORDER — EPHEDRINE 5 MG/ML INJ: 10.0000 mg | INTRAVENOUS | Status: DC | PRN

## 2015-03-07 MED ORDER — SENNOSIDES-DOCUSATE SODIUM 8.6-50 MG PO TABS
2.0000 | ORAL_TABLET | ORAL | Status: DC
  Administered 2015-03-08: 2 via ORAL
  Filled 2015-03-07 (×2): qty 2

## 2015-03-07 MED ORDER — WITCH HAZEL-GLYCERIN EX PADS: 1.0000 "application " | MEDICATED_PAD | CUTANEOUS | Status: DC | PRN

## 2015-03-07 MED ORDER — LACTATED RINGERS IV SOLN
INTRAVENOUS | Status: AC
  Administered 2015-03-07 – 2015-03-08 (×2): via INTRAVENOUS

## 2015-03-07 MED ORDER — LABETALOL HCL 5 MG/ML IV SOLN
20.0000 mg | Freq: Once | INTRAVENOUS | Status: AC
  Administered 2015-03-07: 20 mg via INTRAVENOUS

## 2015-03-07 MED ORDER — PRENATAL MULTIVITAMIN CH
1.0000 | ORAL_TABLET | Freq: Every day | ORAL | Status: DC
  Administered 2015-03-08 – 2015-03-09 (×2): 1 via ORAL
  Filled 2015-03-07 (×2): qty 1

## 2015-03-07 MED ORDER — PHENYLEPHRINE 40 MCG/ML (10ML) SYRINGE FOR IV PUSH (FOR BLOOD PRESSURE SUPPORT): 80.0000 ug | PREFILLED_SYRINGE | INTRAVENOUS | Status: DC | PRN

## 2015-03-07 MED ORDER — LABETALOL HCL 5 MG/ML IV SOLN
INTRAVENOUS | Status: AC
  Filled 2015-03-07: qty 4

## 2015-03-07 MED ORDER — HYDRALAZINE HCL 20 MG/ML IJ SOLN: 10.0000 mg | Freq: Once | INTRAMUSCULAR | Status: DC | PRN

## 2015-03-07 MED ORDER — LANOLIN HYDROUS EX OINT: TOPICAL_OINTMENT | CUTANEOUS | Status: DC | PRN

## 2015-03-07 MED ORDER — LABETALOL HCL 5 MG/ML IV SOLN
20.0000 mg | INTRAVENOUS | Status: DC | PRN
  Administered 2015-03-07 – 2015-03-08 (×6): 20 mg via INTRAVENOUS
  Filled 2015-03-07 (×5): qty 4

## 2015-03-07 MED ORDER — LABETALOL HCL 5 MG/ML IV SOLN
20.0000 mg | INTRAVENOUS | Status: DC | PRN
  Filled 2015-03-07: qty 8

## 2015-03-07 MED ORDER — SIMETHICONE 80 MG PO CHEW: 80.0000 mg | CHEWABLE_TABLET | ORAL | Status: DC | PRN

## 2015-03-07 MED ORDER — MAGNESIUM SULFATE BOLUS VIA INFUSION
4.0000 g | Freq: Once | INTRAVENOUS | Status: DC
  Filled 2015-03-07: qty 500

## 2015-03-07 MED ORDER — ONDANSETRON HCL 4 MG PO TABS: 4.0000 mg | ORAL_TABLET | ORAL | Status: DC | PRN

## 2015-03-07 MED ORDER — MAGNESIUM SULFATE 50 % IJ SOLN
2.0000 g/h | INTRAVENOUS | Status: AC
  Administered 2015-03-07: 4 g/h via INTRAVENOUS
  Administered 2015-03-08: 2 g/h via INTRAVENOUS
  Filled 2015-03-07 (×2): qty 80

## 2015-03-07 MED ORDER — TETANUS-DIPHTH-ACELL PERTUSSIS 5-2.5-18.5 LF-MCG/0.5 IM SUSP
0.5000 mL | Freq: Once | INTRAMUSCULAR | Status: AC
  Administered 2015-03-08: 0.5 mL via INTRAMUSCULAR
  Filled 2015-03-07 (×2): qty 0.5

## 2015-03-07 MED ORDER — OXYTOCIN 40 UNITS IN LACTATED RINGERS INFUSION - SIMPLE MED
1.0000 m[IU]/min | INTRAVENOUS | Status: DC
  Administered 2015-03-07: 2 m[IU]/min via INTRAVENOUS
  Filled 2015-03-07: qty 1000

## 2015-03-07 MED ORDER — ACETAMINOPHEN 325 MG PO TABS
650.0000 mg | ORAL_TABLET | ORAL | Status: DC | PRN
  Administered 2015-03-08 – 2015-03-09 (×2): 650 mg via ORAL
  Filled 2015-03-07 (×2): qty 2

## 2015-03-07 MED ORDER — FENTANYL CITRATE (PF) 100 MCG/2ML IJ SOLN
100.0000 ug | INTRAMUSCULAR | Status: DC | PRN
  Filled 2015-03-07: qty 2

## 2015-03-07 MED ORDER — DIBUCAINE 1 % RE OINT: 1.0000 "application " | TOPICAL_OINTMENT | RECTAL | Status: DC | PRN

## 2015-03-07 NOTE — Progress Notes (Signed)
UR chart review completed.  

## 2015-03-07 NOTE — Progress Notes (Signed)
Labor Progress Note  S: PROM, clear.   O:  BP 158/87 mmHg  Pulse 75  Temp(Src) 98.1 F (36.7 C) (Oral)  Resp 20  Ht  (1.626 m)  Wt 375 lb (170.099 kg)  BMI 64.34 kg/m2  LMP 07/04/2014 (Approximate)  EFM: 135/mod/+accels. No decels  Dilation: 1 Effacement (%): 20 Station: -3 Presentation: Vertex Exam by:: Wolfgang Phoenix RN  Examined personally and agree with above.  A&P: 24 y.o. G2P1001 [redacted]w[redacted]d here for IOL 2/2 to preeclampsia without severe features defined by proteinuria #Labor: IOL. Received #2 doses of cytotec. Will give 3rd dose. Placed foley bulb but this was difficult 2/2 to habitus.   #FWB: Cat I #PreX: BP elevated but currently in mild range. No need for treatment currently #GBS POS- PCN started given ROM.  Federico Flake, MD 3:44 AM

## 2015-03-07 NOTE — Anesthesia Procedure Notes (Signed)
Epidural Patient location during procedure: OB  Staffing Anesthesiologist: TURK, STEPHEN EDWARD Performed by: anesthesiologist   Preanesthetic Checklist Completed: patient identified, pre-op evaluation, timeout performed, IV checked, risks and benefits discussed and monitors and equipment checked  Epidural Patient position: sitting Prep: DuraPrep Patient monitoring: blood pressure and continuous pulse ox Approach: midline Location: L3-L4 Injection technique: LOR air  Needle:  Needle type: Tuohy  Needle gauge: 17 G Needle length: 9 cm Needle insertion depth: 9 cm Catheter size: 19 Gauge Catheter at skin depth: 15 cm Test dose: negative and Other (1% Lidocaine)  Additional Notes Patient identified.  Risk benefits discussed including failed block, incomplete pain control, headache, nerve damage, paralysis, blood pressure changes, nausea, vomiting, reactions to medication both toxic or allergic, and postpartum back pain.  Patient expressed understanding and wished to proceed.  All questions were answered.  Sterile technique used throughout procedure and epidural site dressed with sterile barrier dressing. No paresthesia or other complications noted. The patient did not experience any signs of intravascular injection such as tinnitus or metallic taste in mouth nor signs of intrathecal spread such as rapid motor block. Please see nursing notes for vital signs. Reason for block:procedure for pain   

## 2015-03-07 NOTE — Progress Notes (Signed)
   03/07/15 2100 03/07/15 2117 03/07/15 2146  Vitals  BP (!) 165/103 mmHg (!) 165/100 mmHg (!) 163/117 mmHg  MAP (mmHg) 118 115 128  Pulse Rate 95 95 89  Resp Oxygen Therapy  SpO2 100 % 100 % 100 %  O2 Device Room Air Room Air Room Air     03/07/15 2214  Vitals  BP (!) 150/89 mmHg  MAP (mmHg) 100  Pulse Rate 89  Resp 20  Oxygen Therapy  SpO2 99 %  O2 Device Room Air  Pt given  labetalol at 2106, 2120, and 2203 per order.

## 2015-03-07 NOTE — Anesthesia Preprocedure Evaluation (Signed)
Anesthesia Evaluation  Patient identified by MRN, date of birth, ID band Patient awake    Reviewed: Allergy & Precautions, NPO status , Patient's Chart, lab work & pertinent test results  Airway Mallampati: III  TM Distance: >3 FB Neck ROM: Full    Dental no notable dental hx. (+) Dental Advisory Given   Pulmonary neg pulmonary ROS, former smoker,  breath sounds clear to auscultation  Pulmonary exam normal       Cardiovascular hypertension, negative cardio ROS Normal cardiovascular examRhythm:Regular Rate:Normal     Neuro/Psych negative neurological ROS     GI/Hepatic negative GI ROS, Neg liver ROS,   Endo/Other  Morbid obesity  Renal/GU negative Renal ROS     Musculoskeletal negative musculoskeletal ROS (+)   Abdominal   Peds  Hematology negative hematology ROS (+) Blood dyscrasia, anemia ,   Anesthesia Other Findings Day of surgery medications reviewed with the patient.  Reproductive/Obstetrics (+) Pregnancy Pre-eclampsia                             Anesthesia Physical Anesthesia Plan  ASA: III  Anesthesia Plan: Epidural   Post-op Pain Management:    Induction:   Airway Management Planned:   Additional Equipment:   Intra-op Plan:   Post-operative Plan:   Informed Consent: I have reviewed the patients History and Physical, chart, labs and discussed the procedure including the risks, benefits and alternatives for the proposed anesthesia with the patient or authorized representative who has indicated his/her understanding and acceptance.   Dental advisory given  Plan Discussed with: Anesthesiologist  Anesthesia Plan Comments: (Patient identified.  Risk benefits discussed including failed block, incomplete pain control, headache, nerve damage, paralysis, blood pressure changes, nausea, vomiting, reactions to medication both toxic or allergic, and postpartum back pain.   Patient expressed understanding and wished to proceed.  All questions were answered.    )        Anesthesia Quick Evaluation

## 2015-03-08 ENCOUNTER — Encounter (HOSPITAL_COMMUNITY): Payer: Self-pay | Admitting: *Deleted

## 2015-03-08 LAB — CBC
HEMATOCRIT: 32.7 % — AB (ref 36.0–46.0)
HEMOGLOBIN: 11.1 g/dL — AB (ref 12.0–15.0)
MCH: 30.7 pg (ref 26.0–34.0)
MCHC: 33.9 g/dL (ref 30.0–36.0)
MCV: 90.6 fL (ref 78.0–100.0)
Platelets: 215 10*3/uL (ref 150–400)
RBC: 3.61 MIL/uL — ABNORMAL LOW (ref 3.87–5.11)
RDW: 15.1 % (ref 11.5–15.5)
WBC: 18.2 10*3/uL — AB (ref 4.0–10.5)

## 2015-03-08 MED ORDER — MEASLES, MUMPS & RUBELLA VAC ~~LOC~~ INJ
0.5000 mL | INJECTION | Freq: Once | SUBCUTANEOUS | Status: AC
  Administered 2015-03-08: 0.5 mL via SUBCUTANEOUS
  Filled 2015-03-08 (×2): qty 0.5

## 2015-03-08 MED ORDER — AMLODIPINE BESYLATE 5 MG PO TABS
5.0000 mg | ORAL_TABLET | Freq: Every day | ORAL | Status: DC
  Administered 2015-03-08: 5 mg via ORAL
  Filled 2015-03-08 (×2): qty 1

## 2015-03-08 MED ORDER — SODIUM CHLORIDE 0.9 % IV SOLN: 250.0000 mL | INTRAVENOUS | Status: DC | PRN

## 2015-03-08 MED ORDER — SODIUM CHLORIDE 0.9 % IJ SOLN
3.0000 mL | Freq: Two times a day (BID) | INTRAMUSCULAR | Status: DC
  Administered 2015-03-08: 3 mL via INTRAVENOUS

## 2015-03-08 MED ORDER — AMLODIPINE BESYLATE 10 MG PO TABS
10.0000 mg | ORAL_TABLET | Freq: Every day | ORAL | Status: DC
  Filled 2015-03-08: qty 1

## 2015-03-08 MED ORDER — LABETALOL HCL 5 MG/ML IV SOLN: 20.0000 mg | Freq: Once | INTRAVENOUS | Status: DC | PRN

## 2015-03-08 MED ORDER — SODIUM CHLORIDE 0.9 % IJ SOLN: 3.0000 mL | INTRAMUSCULAR | Status: DC | PRN

## 2015-03-08 NOTE — Progress Notes (Signed)
Pt transferred ambulatory to Rm #[id number]. Tana Coast, RN updated

## 2015-03-08 NOTE — Anesthesia Postprocedure Evaluation (Signed)
  Anesthesia Post-op Note  Patient: Kristina Scott  Procedure(s) Performed: * No procedures listed *  Patient Location: A-ICU  Anesthesia Type:Epidural  Level of Consciousness: awake, alert , oriented and patient cooperative  Airway and Oxygen Therapy: Patient Spontanous Breathing  Post-op Pain: none  Post-op Assessment: Post-op Vital signs reviewed, Patient's Cardiovascular Status Stable, Respiratory Function Stable, Patent Airway, No headache, No backache and Patient able to bend at knees              Post-op Vital Signs: Reviewed and stable  Last Vitals:  Filed Vitals:   03/08/15 0700  BP: 150/97  Pulse: 85  Temp:   Resp:     Complications: No apparent anesthesia complications

## 2015-03-08 NOTE — Progress Notes (Signed)
Post Partum Day 1, s/p IOL for PreEclampsia with out severe features, whose bp increased in to severe range late in labor process. Pt has not diuresed significantly so far Subjective: no complaints, up ad lib and tolerating PO breast feeding, and desires Nexplanon  Objective: Blood pressure 150/97, pulse 85, temperature 97.6 F (36.4 C), temperature source Oral, resp. rate 18, height  (1.626 m), weight 362 lb 1.6 oz (164.247 kg), last menstrual period 07/04/2014, SpO2 99 %, unknown if currently breastfeeding. Temp:  [97.6 F (36.4 C)-98.7 F (37.1 C)] 97.6 F (36.4 C) (08/18 0400) Pulse Rate:  [69-119] 85 (08/18 0700) Resp:  [17-28] 18 (08/18 0600) BP: (133-186)/(57-138) 150/97 mmHg (08/18 0700) SpO2:  [98 %-100 %] 99 % (08/18 0700) Weight:  [362 lb 1.6 oz (164.247 kg)-369 lb 8 oz (167.604 kg)] 362 lb 1.6 oz (164.247 kg) (08/18 0400) Weight was 375 prior to IOL Physical Exam:  General: alert, cooperative and morbidly obese Lochia: appropriate Uterine Fundus: firm Incision:  DVT Evaluation: No evidence of DVT seen on physical exam.   Recent Labs  03/07/15 1026 03/08/15 0535  HGB 12.1 11.1*  HCT 35.0* 32.7*    Assessment/Plan: Breastfeeding and Contraception Nexplanon D/c Mag at noon Add Norvasc 10 daily Transfer to mother baby in afternoon D/c home in am followup 1 wk [facility] for bp recheck 'Daily weights  LOS: 2 days   Christian Treadway V 03/08/2015, 7:30 AM

## 2015-03-08 NOTE — Plan of Care (Signed)
Problem: Discharge Progression Outcomes Goal: Barriers To Progression Addressed/Resolved Outcome: Progressing Mag sulfate discontinued at 1220 today and mother transferred down from AICU to mother baby.

## 2015-03-09 ENCOUNTER — Encounter (HOSPITAL_COMMUNITY): Payer: Self-pay | Admitting: *Deleted

## 2015-03-09 MED ORDER — AMLODIPINE BESYLATE 10 MG PO TABS
10.0000 mg | ORAL_TABLET | Freq: Every day | ORAL | Status: DC
  Administered 2015-03-09: 10 mg via ORAL
  Filled 2015-03-09 (×2): qty 1

## 2015-03-09 MED ORDER — DOCUSATE SODIUM 100 MG PO CAPS: 100.0000 mg | ORAL_CAPSULE | Freq: Two times a day (BID) | ORAL | Status: DC | PRN

## 2015-03-09 MED ORDER — IBUPROFEN 600 MG PO TABS: 600.0000 mg | ORAL_TABLET | Freq: Four times a day (QID) | ORAL | Status: DC | PRN

## 2015-03-09 MED ORDER — AMLODIPINE BESYLATE 10 MG PO TABS: 10.0000 mg | ORAL_TABLET | Freq: Every day | ORAL | Status: DC

## 2015-03-09 NOTE — Discharge Summary (Signed)
Obstetric Discharge Summary Reason for Admission: induction of labor 2/2 preeclampsia without severe features Prenatal Procedures: NST, Preeclampsia and ultrasound Intrapartum Procedures: spontaneous vaginal delivery and GBS prophylaxis Postpartum Procedures: none Complications-Operative and Postpartum: none  Delivery Summary: At 12:10 PM a healthy female was delivered via SVD OA. APGAR: 9, 9; weight 7 lb 1.4 oz.  Placenta status: intact, vasa previa. Cord: 3 vessels with the following complications: none.   Anesthesia: Epidural Episiotomy: None Lacerations: None Est. Blood Loss (mL):minimal  Hospital Course:  Active Problems:   Mild preeclampsia   Kristina Scott is a 24 y.o. [id number] s/p SVD.  Patient was admitted for IOL 2/2 preeclampsia without severe features.  She has postpartum course that was uncomplicated including no problems with ambulating, PO intake, urination, pain, or bleeding. Patient did continue to have elevated BPs s/p delivery. She was started on Norvasc  with better control of pressures. The pt feels ready to go home and  will be discharged with outpatient follow-up.   Today: No acute events overnight.  Pt denies problems with ambulating, voiding or po intake.  She denies nausea or vomiting.  Pain is well controlled.  She has had flatus. She has not had bowel movement.  Lochia Small.  Plan for birth control is  Nexplanon.  Method of Feeding: Bottle.  Physical Exam:  General: alert, cooperative and no distress Lochia: appropriate Uterine Fundus: firm DVT Evaluation: No evidence of DVT seen on physical exam. Extremities: Edema present in bilateral feet  H/H: Lab Results  Component Value Date/Time   HGB 11.1* 03/08/2015 05:35 AM   HCT 32.7* 03/08/2015 05:35 AM   HCT 31.7* 01/02/2015 09:02 AM    Discharge Diagnoses: Preelampsia  Discharge Information: Date: 03/09/2015 Activity: pelvic rest Diet: routine  Medications: PNV, Ibuprofen, Colace  and Iron Breast feeding:  No: Bottle Condition: stable Instructions: refer to handout Discharge to: home     Medication List    STOP taking these medications        valACYclovir 500 MG tablet  Commonly known as:  VALTREX      TAKE these medications        acetaminophen 325 MG tablet  Commonly known as:  TYLENOL  Take 650 mg by mouth every 6 (six) hours as needed for headache.     amLODipine 10 MG tablet  Commonly known as:  NORVASC  Take 1 tablet (10 mg total) by mouth daily.     docusate sodium 100 MG capsule  Commonly known as:  COLACE  Take 1 capsule (100 mg total) by mouth 2 (two) times daily as needed for mild constipation.     ferrous sulfate 325 (65 FE) MG tablet  Take 1 tablet (325 mg total) by mouth 2 (two) times daily with a meal.     ibuprofen 600 MG tablet  Commonly known as:  ADVIL,MOTRIN  Take 1 tablet (600 mg total) by mouth every 6 (six) hours as needed for cramping.     prenatal vitamin w/FE, FA 27-1 MG Tabs tablet  Take 1 tablet by mouth daily at 12 noon.       Follow-up Information    Follow up with [facility]. Schedule an appointment as soon as possible for a visit in 1 week.   Specialty:  Obstetrics and Gynecology   Why:  for BP recheck   Contact information:   [address] [place] [address] [phone]      Caryl Ada, DO 03/09/2015, 7:45 AM  PGY-2, [facility]  I have seen and examined this patient and agree the above assessment.  Respiratory effort normal, lochia appropriate, legs negative,  pain level normal.  CRESENZO-DISHMAN,Jayon Matton 03/12/2015 10:54 AM

## 2015-03-09 NOTE — Progress Notes (Signed)
[facility] RN Tye [place] notified of referral for BP check for [employer]. Dannika also given [employer] number.

## 2015-03-09 NOTE — Discharge Instructions (Signed)

## 2015-03-09 NOTE — Progress Notes (Signed)
Reviewed BP and feel comfortable with d/c home. Will have patient continue amlodipine and expect better control over next 24-48 hours given mechanism of action. Ordered [facility] RN to house for BP check.   Federico Flake, MD

## 2015-03-09 NOTE — Progress Notes (Signed)
Call Dr Doroteo Glassman for increased BP on Kristina Scott.  BP 155/100.  Patient lost IV site and wanted to inform Md and she if they wanted PO labetalol instead of the IV med.  Dr Doroteo Glassman wanted just to wait for Norvasc morning dose.

## 2015-03-09 NOTE — Progress Notes (Signed)
BP 147/86 one and a half hours after norvasc 10 mg called to [facility] MD.

## 2015-03-14 ENCOUNTER — Encounter: Payer: Self-pay | Admitting: Obstetrics and Gynecology

## 2015-03-14 ENCOUNTER — Ambulatory Visit (INDEPENDENT_AMBULATORY_CARE_PROVIDER_SITE_OTHER): Payer: Medicaid Other | Admitting: Obstetrics and Gynecology

## 2015-03-14 VITALS — BP 136/90 | Ht 64.0 in | Wt 341.0 lb

## 2015-03-14 DIAGNOSIS — O1093 Unspecified pre-existing hypertension complicating the puerperium: Secondary | ICD-10-CM | POA: Diagnosis not present

## 2015-03-14 DIAGNOSIS — O165 Unspecified maternal hypertension, complicating the puerperium: Secondary | ICD-10-CM

## 2015-03-14 MED ORDER — POTASSIUM CHLORIDE CRYS ER 10 MEQ PO TBCR: 10.0000 meq | EXTENDED_RELEASE_TABLET | Freq: Two times a day (BID) | ORAL | Status: DC

## 2015-03-14 MED ORDER — HYDROCHLOROTHIAZIDE 25 MG PO TABS: 25.0000 mg | ORAL_TABLET | Freq: Every day | ORAL | Status: DC

## 2015-03-14 NOTE — Progress Notes (Signed)
Patient ID: DINAH LUPA, female   DOB: October 21, 1990, 24 y.o.   MRN: [id number]    [facility] Ob[facility]yn Clinic Visit  Patient name: Kristina Scott MRN [id number]  Date of birth: 1991-06-22  CC & HPI:  Kristina Scott is a 24 y.o. female presenting today for follow up of delivery and high blood pressure. Pt had htn late in pregnancy, was Induced due to htn.--> PreEclampsia. She denies headache, scotoma, ruq pain Reflexes 1+  ROS:  Stopped br feeding already  Pertinent History Reviewed:   Reviewed: Significant for morbid obesity Medical         Past Medical History  Diagnosis Date  . Encounter for Nexplanon removal 12/28/2013  . Contraceptive management 12/28/2013  . Obesity   . Pregnant state, incidental 08/23/2014  . Medical history non-contributory                               Surgical Hx:    Past Surgical History  Procedure Laterality Date  . Wisdom tooth extraction     Medications: Reviewed & Updated - see associated section                       Current outpatient prescriptions:  .  acetaminophen (TYLENOL) 325 MG tablet, Take 650 mg by mouth every 6 (six) hours as needed for headache., Disp: , Rfl:  .  amLODipine (NORVASC) 10 MG tablet, Take 1 tablet (10 mg total) by mouth daily., Disp: 30 tablet, Rfl: 11 .  ferrous sulfate 325 (65 FE) MG tablet, Take 1 tablet (325 mg total) by mouth 2 (two) times daily with a meal., Disp: 60 tablet, Rfl: 3 .  ibuprofen (ADVIL,MOTRIN) 600 MG tablet, Take 1 tablet (600 mg total) by mouth every 6 (six) hours as needed for cramping., Disp: 30 tablet, Rfl: 0 .  prenatal vitamin w/FE, FA (PRENATAL 1 + 1) 27-1 MG TABS tablet, Take 1 tablet by mouth daily at 12 noon., Disp: 30 each, Rfl: 11   Social History: Reviewed -  reports that she quit smoking about 7 years ago. Her smoking use included Cigarettes. She has never used smokeless tobacco.  Objective Findings:  Vitals: Blood pressure 136/90, height  (1.626 m), weight 341 lb (154.677  kg), not currently breastfeeding. weight was 369 at discharge. Now has lost 28 lb  Physical Examination: General appearance - alert, well appearing, and in no distress and overweight Mental status - alert, oriented to person, place, and time, normal mood, behavior, speech, dress, motor activity, and thought processes Extremities - pedal edema 2+. improved +   Assessment & Plan:   A:  1. Persistent pp HTN,  2  Morbid obesity 3. Resolving edema  P:  1. Check urine 2 add Hctz, 25 bid x 30 3 Kdur 20 mEq daily

## 2015-03-14 NOTE — Progress Notes (Signed)
Patient ID: Kristina Scott, female   DOB: 05-10-1991, 24 y.o.   MRN: [id number] Pt here today for follow up after delivery. Pt for BP check.

## 2015-04-04 ENCOUNTER — Telehealth: Payer: Self-pay | Admitting: *Deleted

## 2015-04-04 NOTE — Telephone Encounter (Signed)
Kristina Scott from the [facility] Department is at the pt house this am, Pt B/P left arm 110/98, right arm 100/90, 110/82. Pt is taking HCTZ 25 mg and potassium, Norvasc 10 mg. Pt has had some lower extremity swelling but has improved with the HCTZ. Please advise.

## 2015-04-04 NOTE — Telephone Encounter (Signed)
Pt informed per Joellyn Haff, CNM to continue taking the HCTZ, Norvasc, and potassium as prescribed and keep her Postpartum appt for 04/17/2015 at 10:30 am. If swelling or b/p elevate pt to call our office back. Pt verbalized understanding.

## 2015-04-17 ENCOUNTER — Ambulatory Visit (INDEPENDENT_AMBULATORY_CARE_PROVIDER_SITE_OTHER): Payer: Medicaid Other | Admitting: Women's Health

## 2015-04-17 ENCOUNTER — Encounter: Payer: Self-pay | Admitting: Women's Health

## 2015-04-17 DIAGNOSIS — E669 Obesity, unspecified: Secondary | ICD-10-CM | POA: Insufficient documentation

## 2015-04-17 DIAGNOSIS — Z6841 Body Mass Index (BMI) 40.0 and over, adult: Secondary | ICD-10-CM | POA: Insufficient documentation

## 2015-04-17 NOTE — Patient Instructions (Signed)
NO SEX UNTIL AFTER YOU GET YOUR BIRTH CONTROL   Circumcision: If your baby is older than 28 days when you have the circumcision done at [facility], the fee will go up to $325.50.    Etonogestrel implant What is this medicine? ETONOGESTREL (et oh noe JES trel) is a contraceptive (birth control) device. It is used to prevent pregnancy. It can be used for up to 3 years. This medicine may be used for other purposes; ask your health care Mariena Meares or pharmacist if you have questions. COMMON BRAND NAME(S): Implanon, Nexplanon What should I tell my health care Deletha Jaffee before I take this medicine? They need to know if you have any of these conditions: -abnormal vaginal bleeding -blood vessel disease or blood clots -cancer of the breast, cervix, or liver -depression -diabetes -gallbladder disease -headaches -heart disease or recent heart attack -high blood pressure -high cholesterol -kidney disease -liver disease -renal disease -seizures -tobacco smoker -an unusual or allergic reaction to etonogestrel, other hormones, anesthetics or antiseptics, medicines, foods, dyes, or preservatives -pregnant or trying to get pregnant -breast-feeding How should I use this medicine? This device is inserted just under the skin on the inner side of your upper arm by a health care professional. Talk to your pediatrician regarding the use of this medicine in children. Special care may be needed. Overdosage: If you think you've taken too much of this medicine contact a poison control center or emergency room at once. Overdosage: If you think you have taken too much of this medicine contact a poison control center or emergency room at once. NOTE: This medicine is only for you. Do not share this medicine with others. What if I miss a dose? This does not apply. What may interact with this medicine? Do not take this medicine with any of the following medications: -amprenavir -bosentan -fosamprenavir This  medicine may also interact with the following medications: -barbiturate medicines for inducing sleep or treating seizures -certain medicines for fungal infections like ketoconazole and itraconazole -griseofulvin -medicines to treat seizures like carbamazepine, felbamate, oxcarbazepine, phenytoin, topiramate -modafinil -phenylbutazone -rifampin -some medicines to treat HIV infection like atazanavir, indinavir, lopinavir, nelfinavir, tipranavir, ritonavir -St. John's wort This list may not describe all possible interactions. Give your health care Monifah Freehling a list of all the medicines, herbs, non-prescription drugs, or dietary supplements you use. Also tell them if you smoke, drink alcohol, or use illegal drugs. Some items may interact with your medicine. What should I watch for while using this medicine? This product does not protect you against HIV infection (AIDS) or other sexually transmitted diseases. You should be able to feel the implant by pressing your fingertips over the skin where it was inserted. Tell your doctor if you cannot feel the implant. What side effects may I notice from receiving this medicine? Side effects that you should report to your doctor or health care professional as soon as possible: -allergic reactions like skin rash, itching or hives, swelling of the face, lips, or tongue -breast lumps -changes in vision -confusion, trouble speaking or understanding -dark urine -depressed mood -general ill feeling or flu-like symptoms -light-colored stools -loss of appetite, nausea -right upper belly pain -severe headaches -severe pain, swelling, or tenderness in the abdomen -shortness of breath, chest pain, swelling in a leg -signs of pregnancy -sudden numbness or weakness of the face, arm or leg -trouble walking, dizziness, loss of balance or coordination -unusual vaginal bleeding, discharge -unusually weak or tired -yellowing of the eyes or skin Side effects that  usually do not require medical attention (Report these to your doctor or health care professional if they continue or are bothersome.): -acne -breast pain -changes in weight -cough -fever or chills -headache -irregular menstrual bleeding -itching, burning, and vaginal discharge -pain or difficulty passing urine -sore throat This list may not describe all possible side effects. Call your doctor for medical advice about side effects. You may report side effects to FDA at 1-800-FDA-1088. Where should I keep my medicine? This drug is given in a hospital or clinic and will not be stored at home. NOTE: This sheet is a summary. It may not cover all possible information. If you have questions about this medicine, talk to your doctor, pharmacist, or health care Kabrina Christiano.  2015, Elsevier/Gold Standard. (2012-01-12 15:37:45)

## 2015-04-17 NOTE — Progress Notes (Signed)
Patient ID: Kristina Scott, female   DOB: 1991/05/11, 24 y.o.   MRN: [id number] Subjective:    Kristina Scott is a 24 y.o. G6P2002 African American female who presents for a postpartum visit. She is 6 weeks postpartum following a spontaneous vaginal delivery at 37.2 gestational weeks after IOL for mild pre-eclampsia. Anesthesia: epidural. I have fully reviewed the prenatal and intrapartum course. Postpartum course has been uncomplicated. Baby's course has been uncomplicated. Baby is feeding by bottle. Bleeding on period now. Bowel function is normal. Bladder function is normal. Patient is not sexually active. Last sexual activity: prior to birth of baby. Contraception method is abstinence and wants nexplanon. Postpartum depression screening: negative. Score 1.  Last pap 08/23/14 and was neg.  The following portions of the patient's history were reviewed and updated as appropriate: allergies, current medications, past medical history, past surgical history and problem list.  Review of Systems Pertinent items are noted in HPI.   Filed Vitals:   04/17/15 1041  BP: 120/78  Pulse: 64  Weight: 329 lb (149.233 kg)   Patient's last menstrual period was 04/15/2015.  Objective:   General:  alert, cooperative and no distress   Breasts:  deferred, no complaints  Lungs: clear to auscultation bilaterally  Heart:  regular rate and rhythm  Abdomen: soft, nontender   Vulva: normal  Vagina: normal vagina  Cervix:  closed  Corpus: Well-involuted  Adnexa:  Non-palpable  Rectal Exam: No hemorrhoids        Assessment:   Postpartum exam 6 wks s/p SVB after IOL for pre-e Bottlefeeding Depression screening Contraception counseling   Plan:   Contraception: abstinence until nexplanon placed, order nexplanon today Follow up in: 3 weeks for nexplanon insertion or earlier if needed  Marge Duncans CNM, [facility] 04/17/2015 10:58 AM

## 2015-05-09 ENCOUNTER — Encounter: Payer: Self-pay | Admitting: Women's Health

## 2015-05-09 ENCOUNTER — Ambulatory Visit (INDEPENDENT_AMBULATORY_CARE_PROVIDER_SITE_OTHER): Payer: Medicaid Other | Admitting: Women's Health

## 2015-05-09 VITALS — BP 132/86 | HR 72 | Wt 335.0 lb

## 2015-05-09 DIAGNOSIS — Z3202 Encounter for pregnancy test, result negative: Secondary | ICD-10-CM

## 2015-05-09 DIAGNOSIS — Z30017 Encounter for initial prescription of implantable subdermal contraceptive: Secondary | ICD-10-CM

## 2015-05-09 LAB — POCT URINE PREGNANCY: Preg Test, Ur: NEGATIVE

## 2015-05-09 NOTE — Progress Notes (Signed)
Patient ID: Kristina Scott, female   DOB: [birth year], 24 y.o.   MRN: [id number] Kristina Scott is a 24 y.o. year old African American female here for Nexplanon insertion.  Patient's last menstrual period was 04/15/2015., last sexual intercourse was ~3wks ago, and her pregnancy test today was negative.  Risks/benefits/side effects of Nexplanon have been discussed and her questions have been answered.  Specifically, a failure rate of 07/998 has been reported, with an increased failure rate if pt takes St. John's Wort and/or antiseizure medicaitons.  Kristina Scott is aware of the common side effect of irregular bleeding, which the incidence of decreases over time.  BP 134/90 mmHg  Pulse 72  Wt 335 lb (151.955 kg)  LMP 04/15/2015  Breastfeeding? No  BP repeat 132/86  Results for orders placed or performed in visit on 05/09/15 (from the past 24 hour(s))  POCT urine pregnancy   Collection Time: 05/09/15 11:29 AM  Result Value Ref Range   Preg Test, Ur Negative Negative     She is right-handed, so her left arm, approximately 4 inches proximal from the elbow, was cleansed with alcohol and anesthetized with 2cc of 2% Lidocaine.  The area was cleansed again with betadine and the Nexplanon was inserted per manufacturer's recommendations without difficulty.  A steri-strip and pressure bandage were applied.  Pt was instructed to keep the area clean and dry, remove pressure bandage in 24 hours, and keep insertion site covered with the steri-strip for 3-5 days.  Back up contraception was recommended for 2 weeks.  She was given a card indicating date Nexplanon was inserted and date it needs to be removed. Follow-up PRN problems.  Marge DuncansBooker, Llewyn Heap Randall CNM, [facility] 05/09/2015 11:49 AM

## 2015-05-09 NOTE — Patient Instructions (Signed)

## 2015-10-22 ENCOUNTER — Encounter (HOSPITAL_COMMUNITY): Payer: Self-pay

## 2015-10-22 ENCOUNTER — Emergency Department (HOSPITAL_COMMUNITY)
Admission: EM | Admit: 2015-10-22 | Discharge: 2015-10-22 | Disposition: A | Discharge status: Home / Self Care | Payer: Medicaid Other | Attending: Emergency Medicine | Admitting: Emergency Medicine

## 2015-10-22 DIAGNOSIS — J020 Streptococcal pharyngitis: Secondary | ICD-10-CM | POA: Insufficient documentation

## 2015-10-22 DIAGNOSIS — Z87891 Personal history of nicotine dependence: Secondary | ICD-10-CM | POA: Insufficient documentation

## 2015-10-22 DIAGNOSIS — Z79899 Other long term (current) drug therapy: Secondary | ICD-10-CM | POA: Insufficient documentation

## 2015-10-22 LAB — RAPID STREP SCREEN (MED CTR MEBANE ONLY): Streptococcus, Group A Screen (Direct): POSITIVE — AB

## 2015-10-22 MED ORDER — AMOXICILLIN 250 MG PO CAPS
500.0000 mg | ORAL_CAPSULE | Freq: Once | ORAL | Status: AC
  Administered 2015-10-22: 500 mg via ORAL
  Filled 2015-10-22: qty 2

## 2015-10-22 MED ORDER — AMOXICILLIN 500 MG PO CAPS: 500.0000 mg | ORAL_CAPSULE | Freq: Two times a day (BID) | ORAL | Status: DC

## 2015-10-22 NOTE — ED Notes (Signed)
Complain of sore throat since Saturday

## 2015-10-22 NOTE — Discharge Instructions (Signed)
Strep Throat °Strep throat is an infection of the throat. It is caused by germs. Strep throat spreads from person to person because of coughing, sneezing, or close contact. °HOME CARE °Medicines  °· Take over-the-counter and prescription medicines only as told by your doctor. °· Take your antibiotic medicine as told by your doctor. Do not stop taking the medicine even if you feel better. °· Have family members who also have a sore throat or fever go to a doctor. °Eating and Drinking  °· Do not share food, drinking cups, or personal items. °· Try eating soft foods until your sore throat feels better. °· Drink enough fluid to keep your pee (urine) clear or pale yellow. °General Instructions °· Rinse your mouth (gargle) with a salt-water mixture 3-4 times per day or as needed. To make a salt-water mixture, stir ½-1 tsp of salt into 1 cup of warm water. °· Make sure that all people in your house wash their hands well. °· Rest. °· Stay home from school or work until you have been taking antibiotics for 24 hours. °· Keep all follow-up visits as told by your doctor. This is important. °GET HELP IF: °· Your neck keeps getting bigger. °· You get a rash, cough, or earache. °· You cough up thick liquid that is green, yellow-brown, or bloody. °· You have pain that does not get better with medicine. °· Your problems get worse instead of getting better. °· You have a fever. °GET HELP RIGHT AWAY IF: °· You throw up (vomit). °· You get a very bad headache. °· You neck hurts or it feels stiff. °· You have chest pain or you are short of breath. °· You have drooling, very bad throat pain, or changes in your voice. °· Your neck is swollen or the skin gets red and tender. °· Your mouth is dry or you are peeing less than normal. °· You keep feeling more tired or it is hard to wake up. °· Your joints are red or they hurt. °  °This information is not intended to replace advice given to you by your health care provider. Make sure you  discuss any questions you have with your health care provider. °  °Document Released: 12/24/2007 Document Revised: 03/28/2015 Document Reviewed: 10/30/2014 °Elsevier Interactive Patient Education ©2016 Elsevier Inc. ° °

## 2015-10-22 NOTE — ED Provider Notes (Signed)
CSN: [id number]     Arrival date & time 10/22/15  4098 History   First MD Initiated Contact with Patient 10/22/15 [phone]     Chief Complaint  Patient presents with  . Sore Throat     (Consider location/radiation/quality/duration/timing/severity/associated sxs/prior Treatment) HPI  Kristina Scott is a 25 y.o. female who presents to the Emergency Department complaining of sore throat for 2 days.  She states that her throat is painful with swallowing and symptoms have been worsening.  She also reports associated cough and nasal congestion.  Cough is non-productive and worse with lying down.  She denies recent sick contacts.  She has not taken any medication for her symptoms.  She denies fever, vomiting, abdominal pain, neck pain or stiffness and rash.     Past Medical History  Diagnosis Date  . Encounter for Nexplanon removal 12/28/2013  . Contraceptive management 12/28/2013  . Obesity   . Pregnant state, incidental 08/23/2014  . Medical history non-contributory    Past Surgical History  Procedure Laterality Date  . Wisdom tooth extraction     Family History  Problem Relation Age of Onset  . Stroke Maternal Grandfather    Social History  Substance Use Topics  . Smoking status: Former Smoker    Types: Cigarettes    Quit date: 03/07/2008  . Smokeless tobacco: Never Used  . Alcohol Use: No   OB History    Gravida Para Term Preterm AB TAB SAB Ectopic Multiple Living   0 2     Review of Systems  Constitutional: Negative for fever, chills, activity change and appetite change.  HENT: Positive for congestion and sore throat. Negative for ear pain, facial swelling, sneezing, trouble swallowing and voice change.   Eyes: Negative for pain and visual disturbance.  Respiratory: Positive for cough. Negative for shortness of breath.   Gastrointestinal: Negative for nausea, vomiting and abdominal pain.  Musculoskeletal: Negative for arthralgias, neck pain and neck stiffness.   Skin: Negative for color change and rash.  Neurological: Negative for dizziness, facial asymmetry, speech difficulty, numbness and headaches.  Hematological: Negative for adenopathy.  All other systems reviewed and are negative.     Allergies  Review of patient's allergies indicates no known allergies.  Home Medications   Prior to Admission medications   Medication Sig Start Date End Date Taking? Authorizing Provider  acetaminophen (TYLENOL) 325 MG tablet Take 650 mg by mouth every 6 (six) hours as needed for headache.    Historical Provider, MD  amLODipine (NORVASC) 10 MG tablet Take 1 tablet (10 mg total) by mouth daily. Patient not taking: Reported on 04/17/2015 03/09/15   Federico Flake, MD  ferrous sulfate 325 (65 FE) MG tablet Take 1 tablet (325 mg total) by mouth 2 (two) times daily with a meal. 09/12/14   Cheral Marker, CNM  hydrochlorothiazide (HYDRODIURIL) 25 MG tablet Take 1 tablet (25 mg total) by mouth daily. Patient not taking: Reported on 04/17/2015 03/14/15   Tilda Burrow, MD  ibuprofen (ADVIL,MOTRIN) 600 MG tablet Take 1 tablet (600 mg total) by mouth every 6 (six) hours as needed for cramping. 03/09/15   Pincus Large, DO  potassium chloride (K-DUR,KLOR-CON) 10 MEQ tablet Take 1 tablet (10 mEq total) by mouth 2 (two) times daily. Patient not taking: Reported on 04/17/2015 03/14/15   Tilda Burrow, MD  prenatal vitamin w/FE, FA (PRENATAL 1 + 1) 27-1 MG TABS tablet Take 1 tablet by  mouth daily at 12 noon. 08/23/14   Adline PotterJennifer A Griffin, NP   BP 152/102 mmHg  Pulse 91  Temp(Src) 98.3 F (36.8 C) (Oral)  Resp 20  Ht 5\' 4"  (1.626 m)  Wt 166.017 kg  BMI 62.79 kg/m2  SpO2 100%  LMP  Physical Exam  Constitutional: She is oriented to person, place, and time. She appears well-developed and well-nourished. No distress.  HENT:  Head: Normocephalic and atraumatic.  Right Ear: Tympanic membrane and ear canal normal.  Left Ear: Tympanic membrane and ear canal  normal.  Mouth/Throat: Uvula is midline and mucous membranes are normal. No trismus in the jaw. No uvula swelling. Posterior oropharyngeal edema and posterior oropharyngeal erythema present. No oropharyngeal exudate or tonsillar abscesses.  Neck: Normal range of motion. Neck supple.  Cardiovascular: Normal rate, regular rhythm and normal heart sounds.   Pulmonary/Chest: Effort normal and breath sounds normal.  Abdominal: Soft. Normal appearance. There is no splenomegaly. There is no tenderness.  Pt is morbidly obese.    Musculoskeletal: Normal range of motion.  Lymphadenopathy:    She has no cervical adenopathy.  Neurological: She is alert and oriented to person, place, and time. She exhibits normal muscle tone. Coordination normal.  Skin: Skin is warm and dry.  Nursing note and vitals reviewed.   ED Course  Procedures (including critical care time) Labs Review Labs Reviewed  RAPID STREP SCREEN (NOT AT [facility]) - Abnormal; Notable for the following:    Streptococcus, Group A Screen (Direct) POSITIVE (*)    All other components within normal limits    Imaging Review No results found. I have personally reviewed and evaluated these images and lab results as part of my medical decision-making.   EKG Interpretation None      MDM   Final diagnoses:  Strep pharyngitis    Patient is well appearing. Nontoxic. No concerning symptoms for peritonsillar abscess. Rapid strep screen is positive. Patient agrees to antibiotic therapy, fluids, Tylenol or Motrin if needed and close PCP follow-up    Pauline Ausammy Jonanthan Bolender, PA-C 10/22/15 0935  Glynn OctaveStephen Rancour, MD 10/22/15 [phone]

## 2015-12-04 IMAGING — CR DG KNEE COMPLETE 4+V*L*
1 series · 4 of 4 positions shown · non-contrast
Comparison: None.

CLINICAL DATA: Left knee pain for 1 week.  No specific injury.

EXAM:
LEFT KNEE - COMPLETE 4+ VIEW

[Series 1: ap · 0.17mm/px · 4 of 4 slices shown]
[im 1/4]
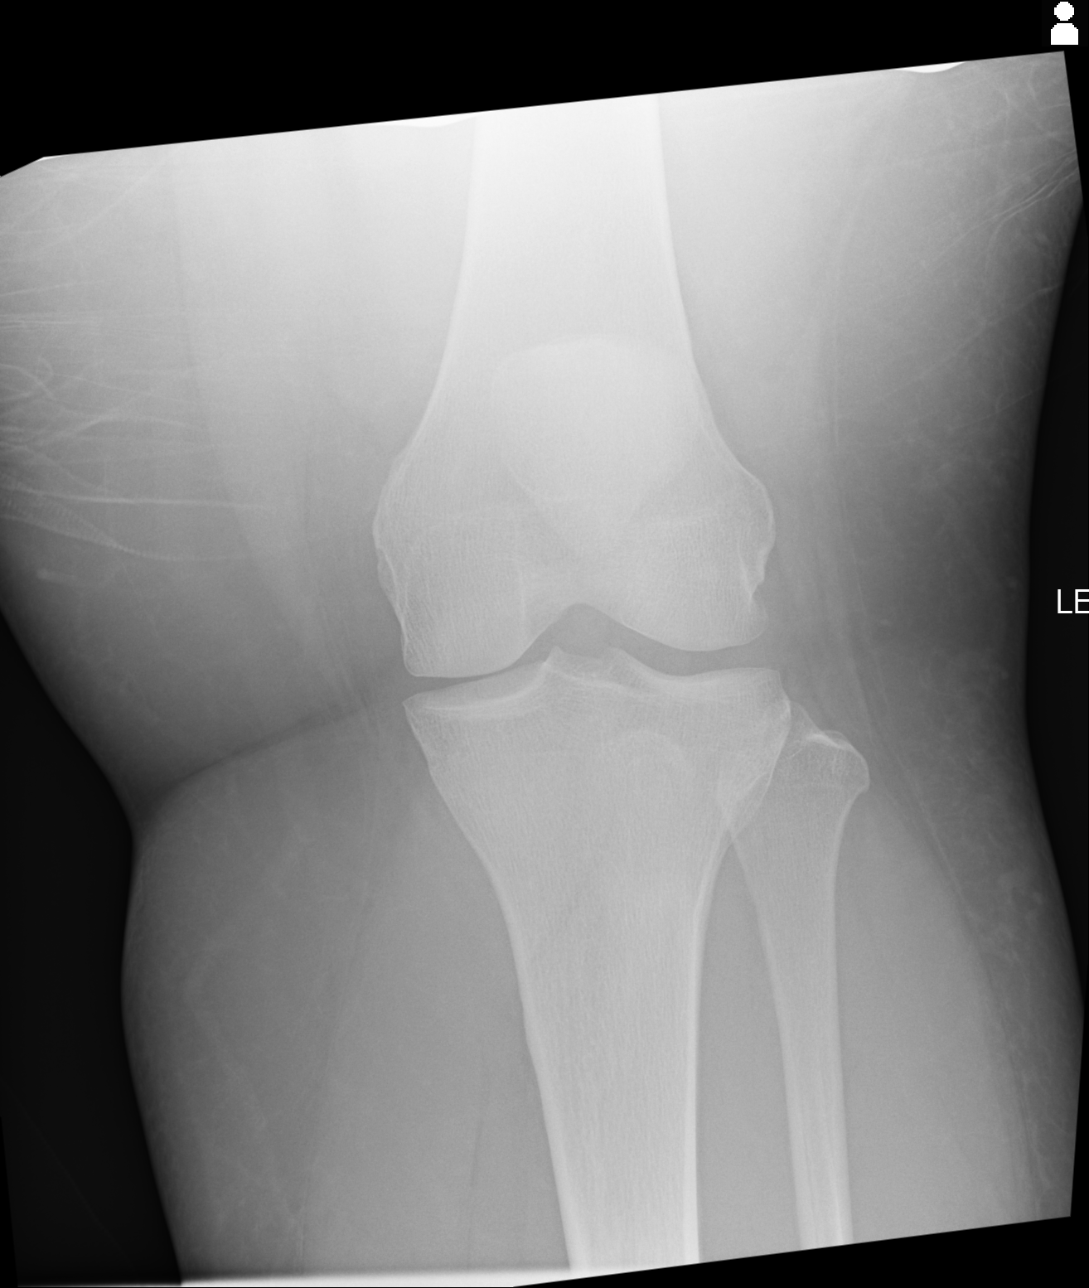
[im 2/4]
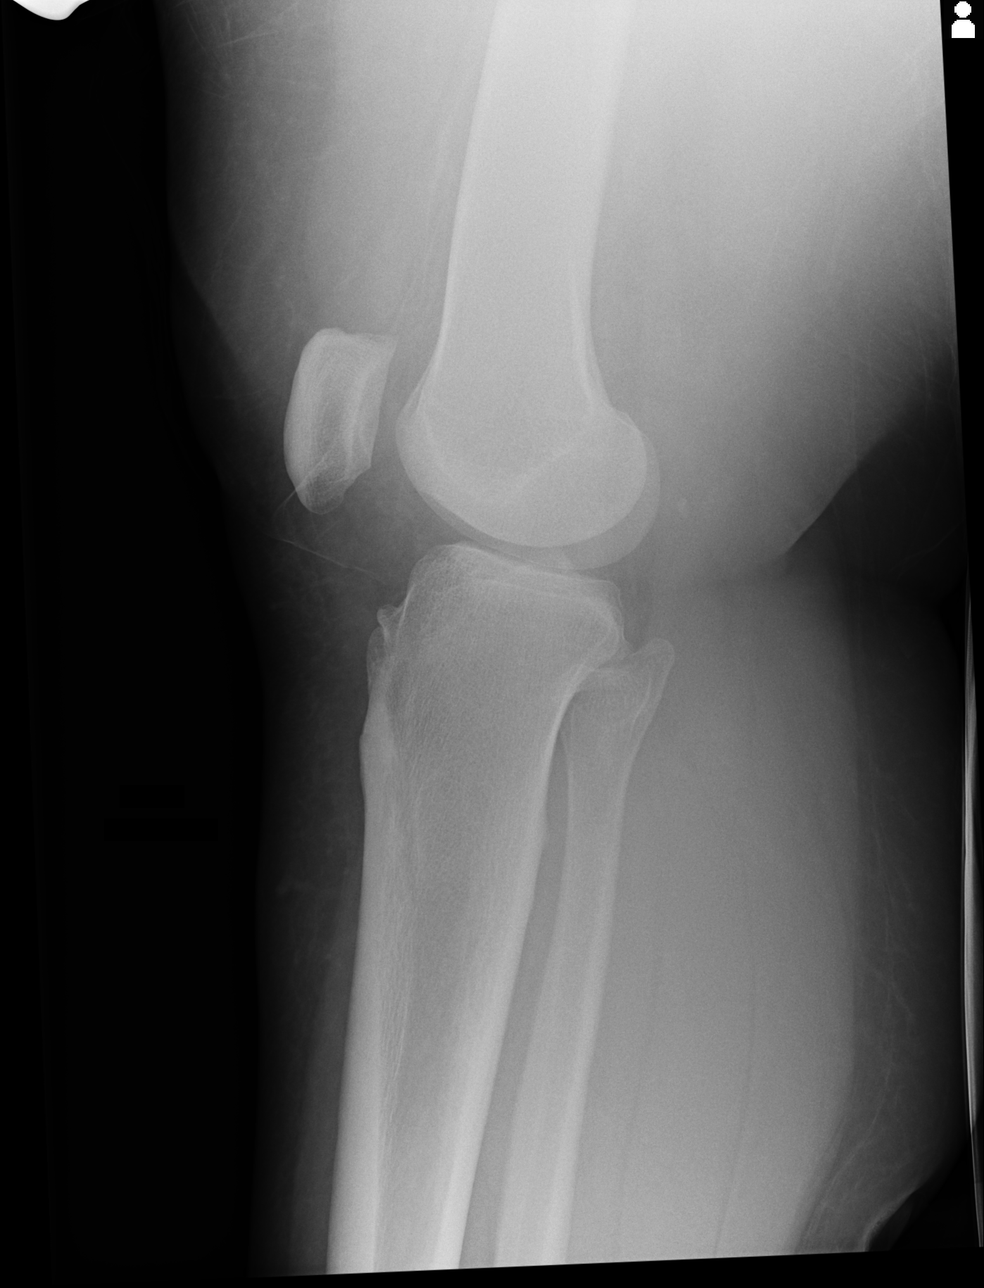
[im 3/4]
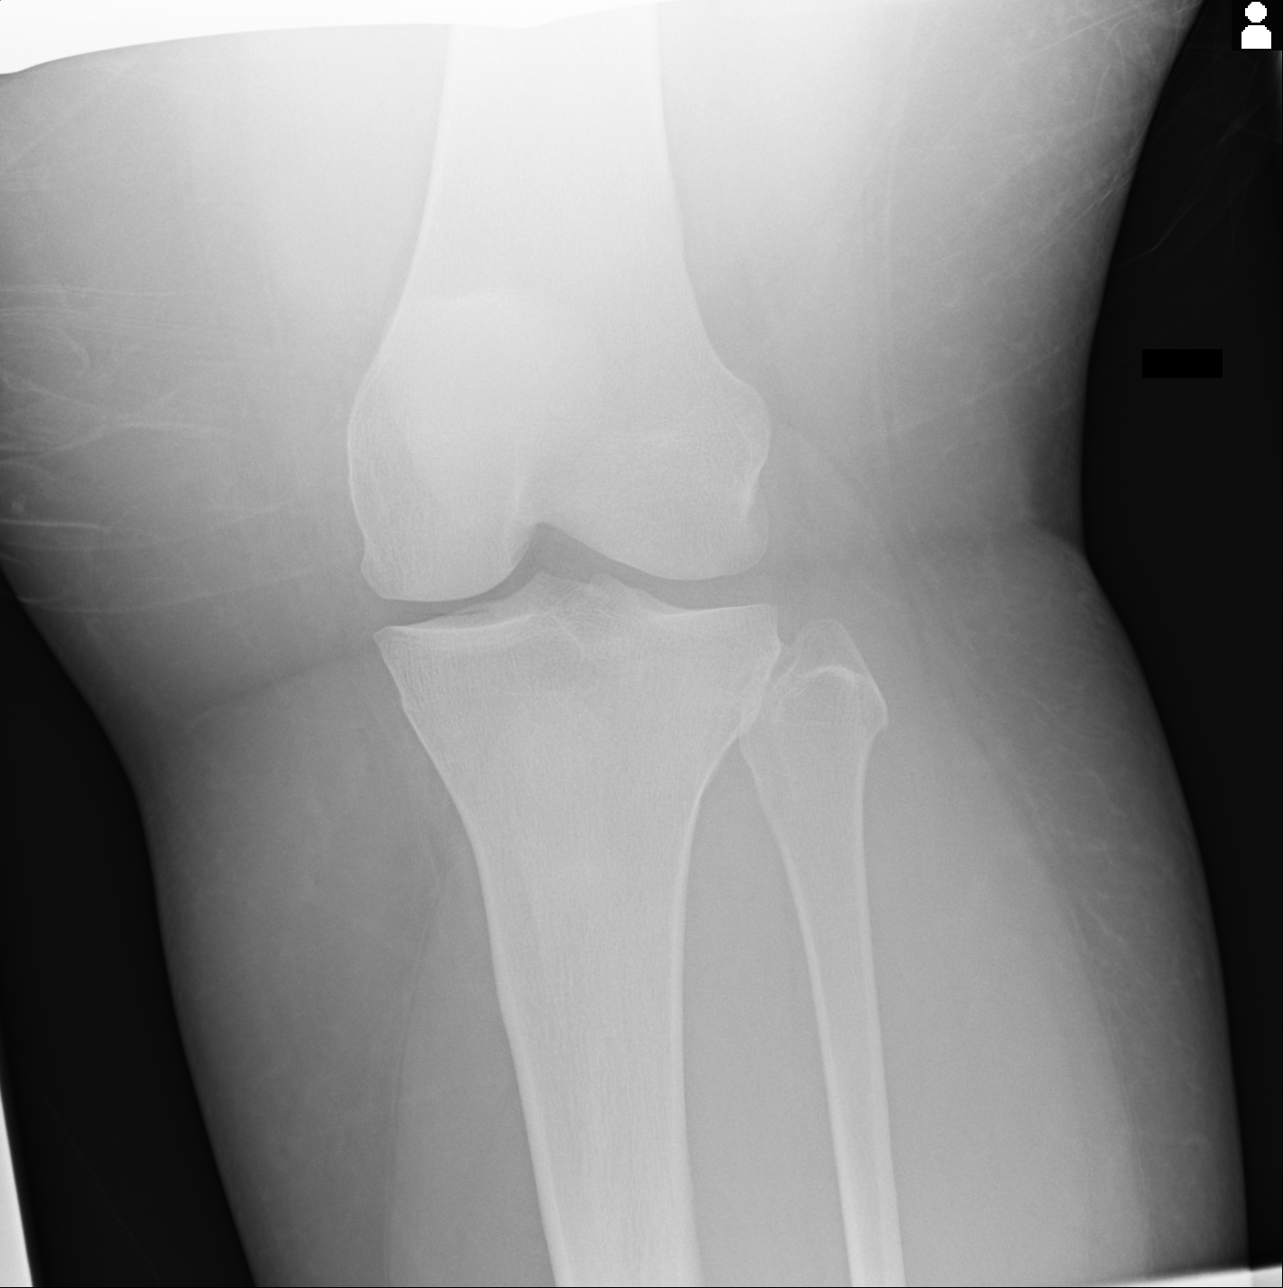
[im 4/4]
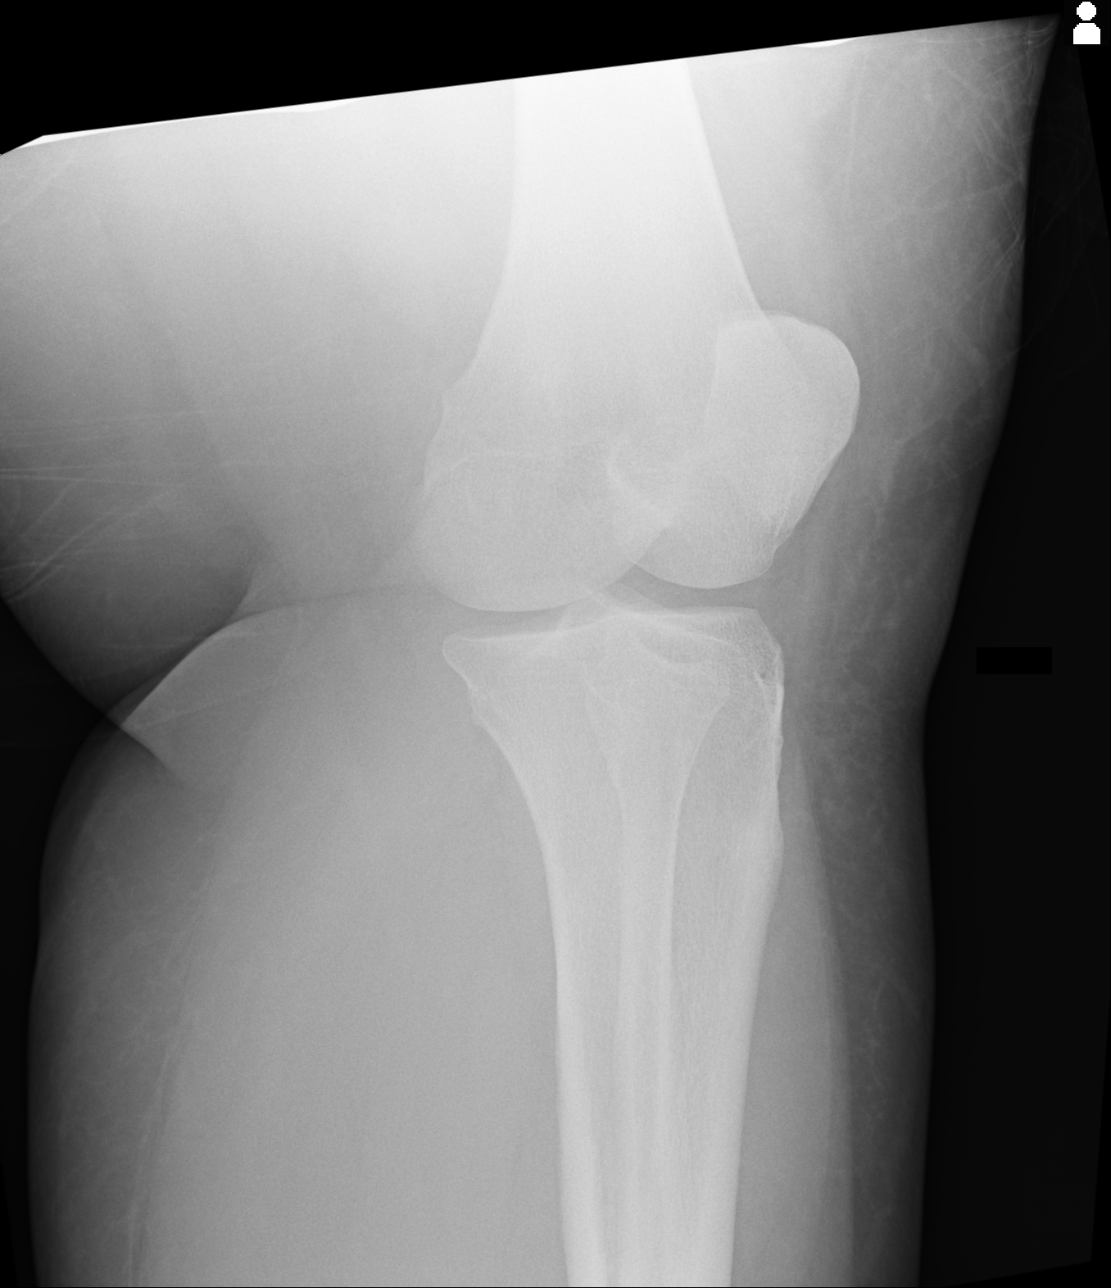

[4 of 4 positions shown; findings below may reference images not displayed]

FINDINGS: There is no evidence of fracture, dislocation, or joint effusion.
There is no evidence of arthropathy or other focal bone abnormality.
Soft tissues are unremarkable.
IMPRESSION: Negative.

## 2016-06-01 IMAGING — US US FETAL BPP W/O NONSTRESS
1 series · 13 of 28 positions shown · non-contrast
Comparison: none

[Series 1: us fetal nuch trans add gest · 46 acquisitions, 13 frames shown]
[im 2/46]
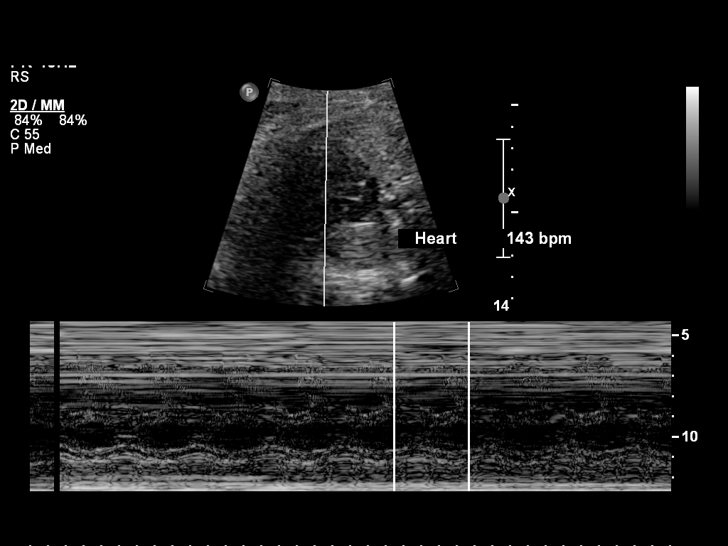
[im 6/46]
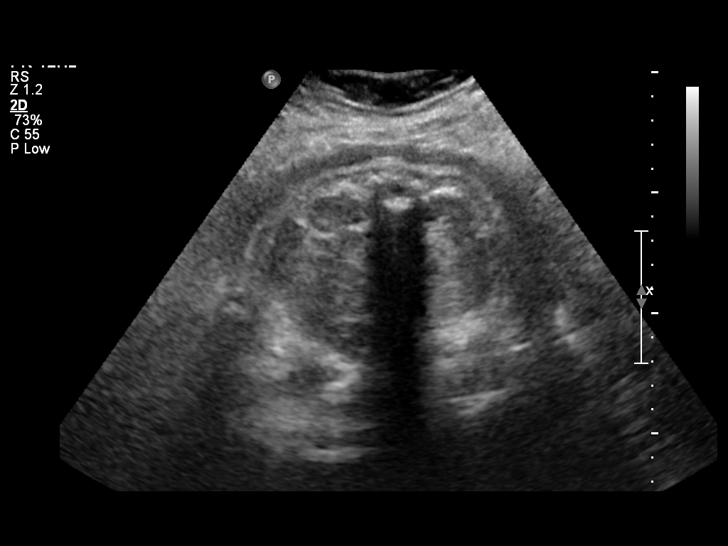
[im 9/46]
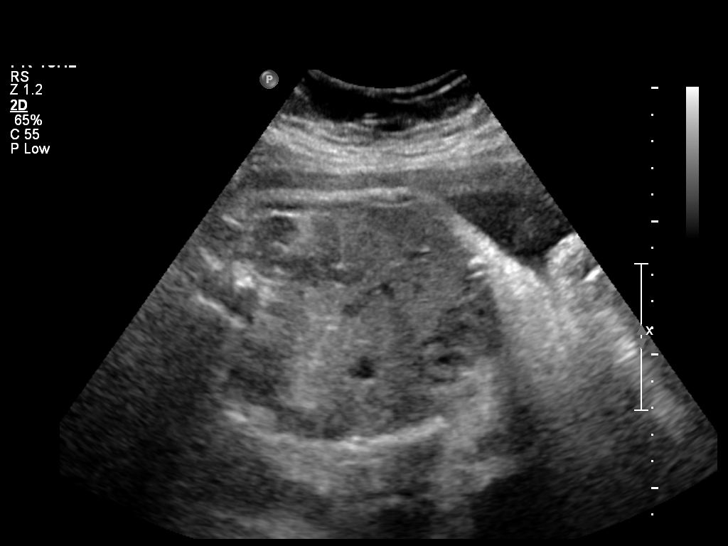
[im 12/46]
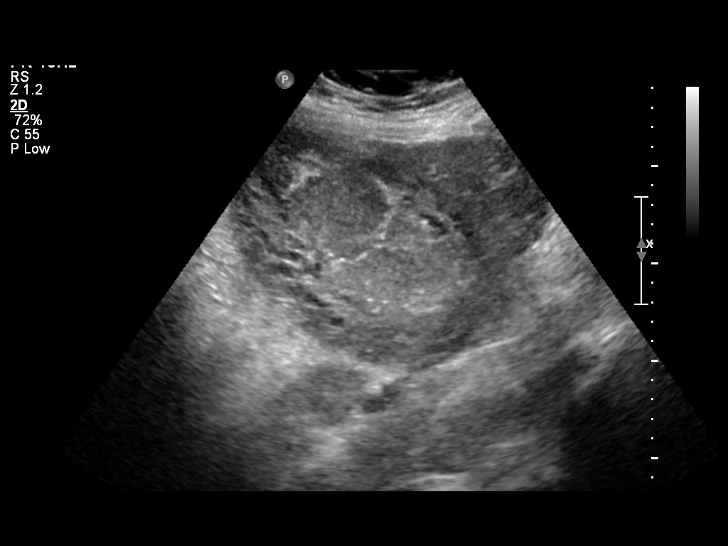
[im 16/46]
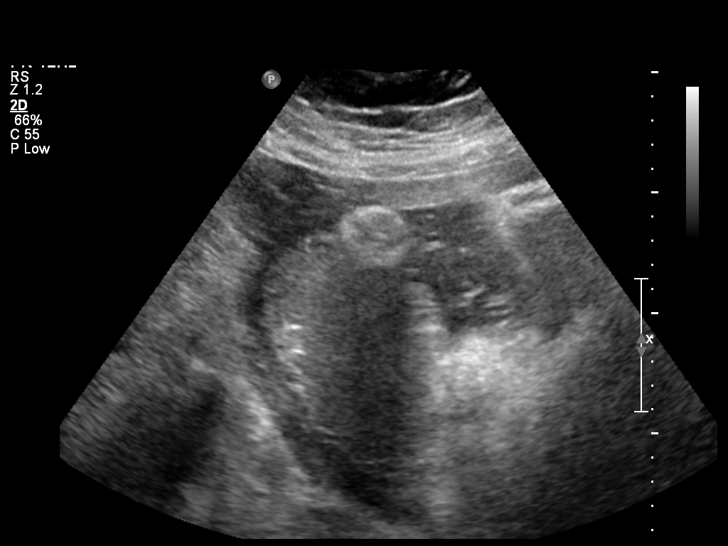
[im 19/46]
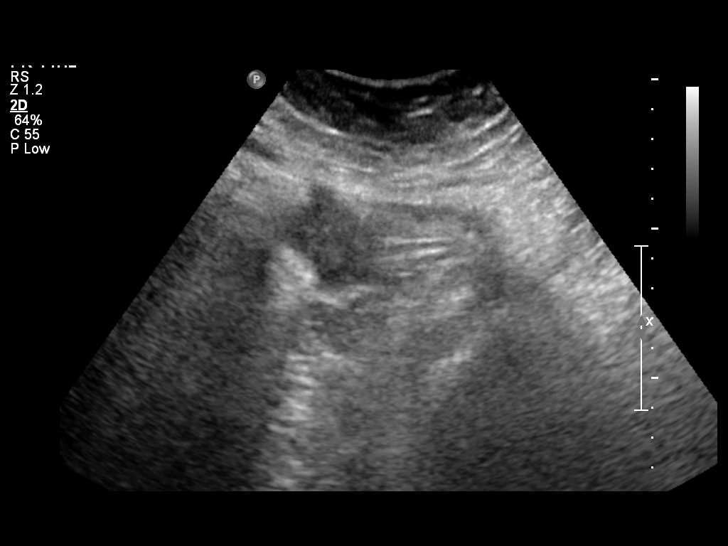
[im 24/46]
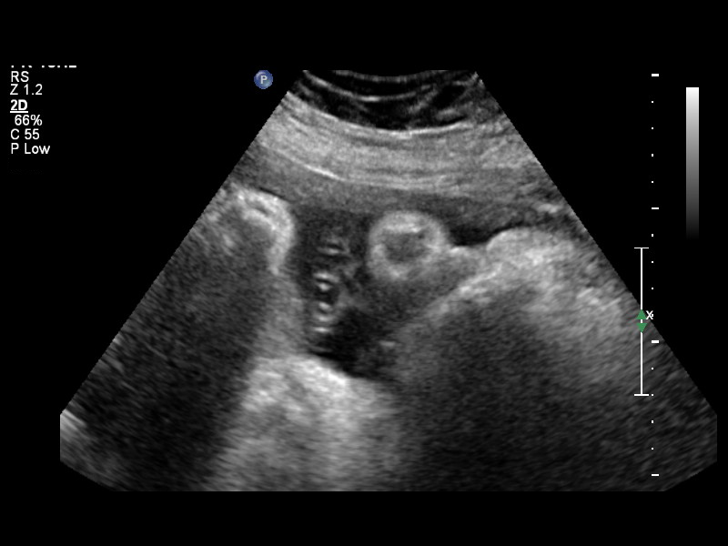
[im 27/46]
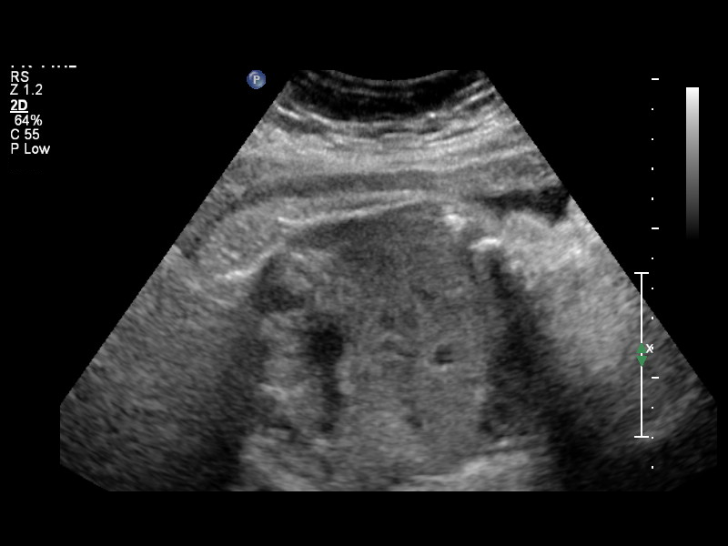
[im 31/46]
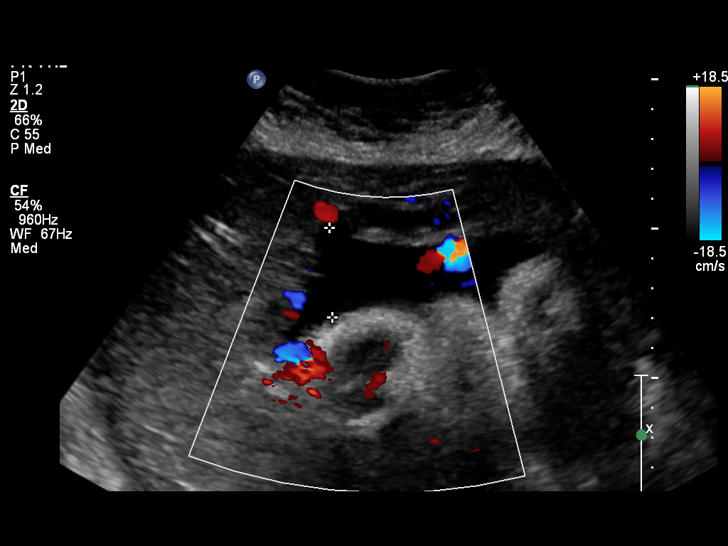
[im 34/46]
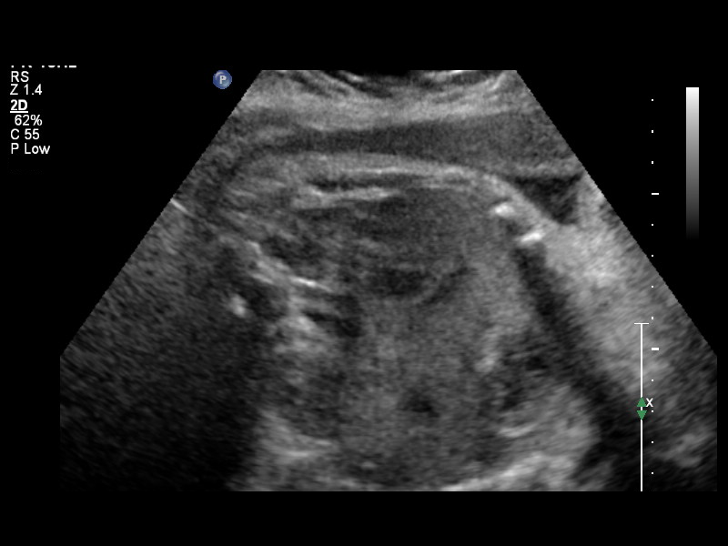
[im 37/46]
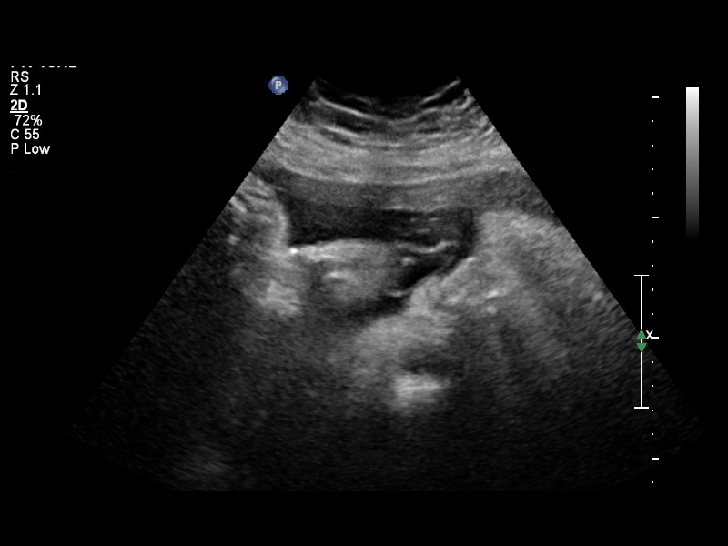
[im 41/46]
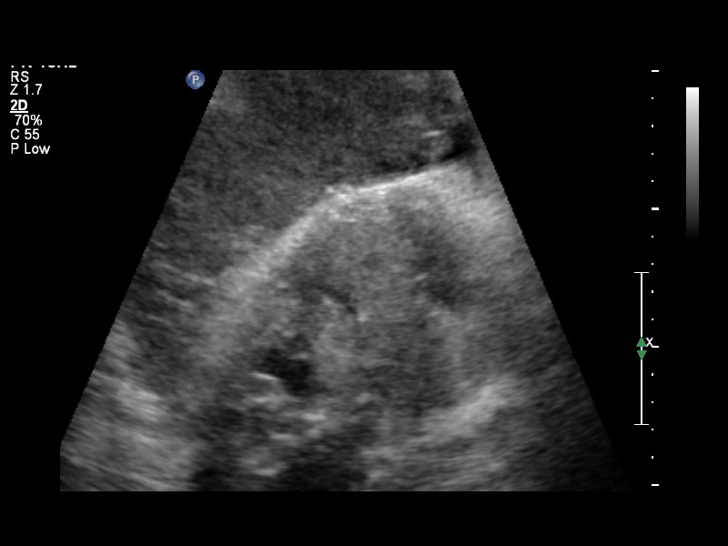
[im 44/46]
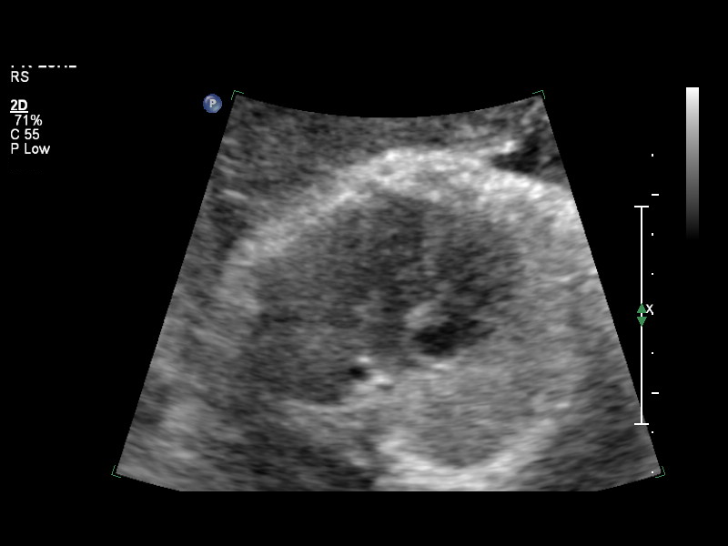

[13 of 28 positions shown; findings below may reference images not displayed]

OBSTETRICS REPORT
(Signed Final 02/28/2015 [DATE])

Date:

Service(s) Provided

Indications

Gestational hypertension without significant
proteinuria, third trimester
Non-reactive NST
36 weeks gestation of pregnancy
Fetal Evaluation

Num Of             1
Fetuses:
Fetal Heart        143                          bpm
Rate:
Cardiac Activity:  Observed
Presentation:      Cephalic
Placenta:          Right lateral, above
cervical os
P. Cord            Not well visualized
Insertion:

Amniotic Fluid
AFI FV:      Subjectively within normal limits
AFI Sum:     14.29    cm      52  %Tile     Larg Pckt:    5.08   cm
RUQ:   2.98    cm    RLQ:   3.61    cm   LUQ:    2.62    cm   LLQ:    5.08   cm
Biophysical Evaluation

Amniotic F.V:   Within normal limits        F. Tone:        Observed
F. Movement:    Observed                    Score:          [DATE]
F. Breathing:   Observed
Gestational Age

LMP:           34w 0d        Date:  07/04/14                  EDD:   04/10/15
Best:          36w 1d    Det. By:   Early Ultrasound          EDD:   03/26/15
(08/28/14)
Cervix Uterus Adnexa
Cervix:       Not visualized (advanced GA >93wks)
Uterus:       No abnormality visualized.
Cul De Sac:   No free fluid seen.

Left Ovary:    No adnexal mass visualized.
Right Ovary:   No adnexal mass visualized.

Adnexa:     No abnormality visualized.
Impression

SIUP at 36+1 weeks
Normal amniotic fluid volume
BPP [DATE]
Recommendations

Follow-up as clinically indicated

## 2018-07-17 ENCOUNTER — Emergency Department (HOSPITAL_COMMUNITY)
Admission: EM | Admit: 2018-07-17 | Discharge: 2018-07-17 | Disposition: A | Discharge status: Home / Self Care | Payer: BLUE CROSS/BLUE SHIELD | Attending: Emergency Medicine | Admitting: Emergency Medicine

## 2018-07-17 ENCOUNTER — Encounter (HOSPITAL_COMMUNITY): Payer: Self-pay | Admitting: Emergency Medicine

## 2018-07-17 ENCOUNTER — Other Ambulatory Visit: Payer: Self-pay

## 2018-07-17 DIAGNOSIS — J111 Influenza due to unidentified influenza virus with other respiratory manifestations: Secondary | ICD-10-CM | POA: Diagnosis not present

## 2018-07-17 DIAGNOSIS — Z87891 Personal history of nicotine dependence: Secondary | ICD-10-CM | POA: Diagnosis not present

## 2018-07-17 DIAGNOSIS — Z79899 Other long term (current) drug therapy: Secondary | ICD-10-CM | POA: Diagnosis not present

## 2018-07-17 DIAGNOSIS — J020 Streptococcal pharyngitis: Secondary | ICD-10-CM | POA: Diagnosis not present

## 2018-07-17 DIAGNOSIS — R05 Cough: Secondary | ICD-10-CM | POA: Diagnosis present

## 2018-07-17 HISTORY — DX: Unspecified maternal hypertension, complicating childbirth: O16.4

## 2018-07-17 LAB — INFLUENZA PANEL BY PCR (TYPE A & B)
INFLBPCR: POSITIVE — AB
Influenza A By PCR: NEGATIVE

## 2018-07-17 LAB — GROUP A STREP BY PCR: Group A Strep by PCR: DETECTED — AB

## 2018-07-17 MED ORDER — ACETAMINOPHEN 325 MG PO TABS
650.0000 mg | ORAL_TABLET | Freq: Once | ORAL | Status: AC | PRN
  Administered 2018-07-17: 650 mg via ORAL
  Filled 2018-07-17: qty 2

## 2018-07-17 MED ORDER — IBUPROFEN 800 MG PO TABS
800.0000 mg | ORAL_TABLET | Freq: Once | ORAL | Status: AC
  Administered 2018-07-17: 800 mg via ORAL
  Filled 2018-07-17: qty 1

## 2018-07-17 MED ORDER — OSELTAMIVIR PHOSPHATE 75 MG PO CAPS: 75.0000 mg | ORAL_CAPSULE | Freq: Two times a day (BID) | ORAL | 0 refills | Status: DC

## 2018-07-17 MED ORDER — PENICILLIN G BENZATHINE 1200000 UNIT/2ML IM SUSP
1.2000 10*6.[IU] | Freq: Once | INTRAMUSCULAR | Status: AC
  Administered 2018-07-17: 1.2 10*6.[IU] via INTRAMUSCULAR
  Filled 2018-07-17: qty 2

## 2018-07-17 MED ORDER — IBUPROFEN 800 MG PO TABS: 800.0000 mg | ORAL_TABLET | Freq: Three times a day (TID) | ORAL | 0 refills | Status: DC

## 2018-07-17 NOTE — Discharge Instructions (Addendum)
Drink plenty of fluids, Tylenol every 4 hours for fever.  Follow-up with your primary doctor for recheck return here for any worsening symptoms.

## 2018-07-17 NOTE — ED Triage Notes (Signed)
Patient c/o productive cough with thick yellow sputum, fatigue, sore throat and body aches. Unsure of any fevers. Patient states started having a nose bleed and coughing up blooding sputum today. Per patient took Sudafed this morning at 8am.

## 2018-07-17 NOTE — ED Provider Notes (Signed)
[facility] EMERGENCY DEPARTMENT Provider Note   CSN: [id number] Arrival date & time: 07/17/18  1410     History   Chief Complaint Chief Complaint  Patient presents with  . Cough    HPI Kristina Scott is a 27 y.o. female.  HPI   Kristina Scott is a 27 y.o. female who presents to the Emergency Department complaining of generalized body aches, sore throat, fatigue, and cough.  She does report intermittent chills and unsure of fever at home.  Symptoms have been present for 2 to 3 days.  She states that she has had severe nasal congestion and sneezing and today, she noticed bright red blood after sneezing and coughing.  States the blood was streaked along with mucus.  She has been taking Sudafed with minimal relief.  She states other coworkers have recently been sick.  She denies abdominal pain, chest pain, shortness of breath, nausea vomiting or diarrhea.   Past Medical History:  Diagnosis Date  . Contraceptive management 12/28/2013  . Encounter for Nexplanon removal 12/28/2013  . High blood pressure affecting pregnancy in third trimester, delivered   . Medical history non-contributory   . Obesity   . Pregnant state, incidental 08/23/2014    Patient Active Problem List   Diagnosis Date Noted  . Obese 04/17/2015  . Mild preeclampsia 03/06/2015  . 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 WISDOM TOOTH EXTRACTION       OB History    Gravida  2   Para  2   Term  2   Preterm      AB      Living  2     SAB      TAB      Ectopic      Multiple  0   Live Births  2            Home Medications    Prior to Admission medications   Medication Sig Start Date End Date Taking? Authorizing Provider  acetaminophen (TYLENOL) 325 MG tablet Take 650 mg by mouth every 6 (six) hours as needed for headache.    [provider]  amoxicillin (AMOXIL) 500 MG capsule Take 1 capsule (500 mg total) by mouth 2 (two)  times daily. For 10 days 10/22/15   Pauline Aus, PA-C  ferrous sulfate 325 (65 FE) MG tablet Take 1 tablet (325 mg total) by mouth 2 (two) times daily with a meal. 09/12/14   Cheral Marker, CNM  Pseudoeph-Doxylamine-DM-APAP (NYQUIL PO) Take 30 mLs by mouth daily.    [provider]    Family History Family History  Problem Relation Age of Onset  . Stroke Maternal Grandfather     Social History Social History   Tobacco Use  . Smoking status: Former Smoker    Types: Cigarettes    Last attempt to quit: 03/07/2008    Years since quitting: 10.3  . Smokeless tobacco: Never Used  Substance Use Topics  . Alcohol use: No    Alcohol/week: 0.0 standard drinks  . Drug use: No     Allergies   Patient has no known allergies.   Review of Systems Review of Systems  Constitutional: Positive for appetite change, chills, fatigue and fever. Negative for activity change.  HENT: Positive for congestion, sneezing and sore throat. Negative for ear pain, facial swelling, trouble swallowing and voice change.   Eyes: Negative for pain and visual disturbance.  Respiratory:  Positive for cough. Negative for shortness of breath.   Cardiovascular: Negative for chest pain.  Gastrointestinal: Negative for abdominal pain, diarrhea, nausea and vomiting.  Genitourinary: Negative for dysuria and flank pain.  Musculoskeletal: Negative for arthralgias, neck pain and neck stiffness.  Skin: Negative for color change and rash.  Neurological: Negative for dizziness, facial asymmetry, speech difficulty, weakness, numbness and headaches.  Hematological: Negative for adenopathy.     Physical Exam Updated Vital Signs BP (!) 150/79 (BP Location: Right Wrist)   Pulse 100   Temp (!) 100.4 F (38 C) (Oral)   Resp 20   Ht 5\' 6"  (1.676 m)   Wt (!) 149.7 kg   LMP 06/14/2018   SpO2 100%   BMI 53.26 kg/m   Physical Exam Vitals signs and nursing note reviewed.  Constitutional:      Appearance:  Normal appearance. She is obese.  HENT:     Head: Atraumatic.     Right Ear: Tympanic membrane and ear canal normal.     Left Ear: Tympanic membrane and ear canal normal.     Nose: Congestion present. No rhinorrhea.     Comments: No epistaxis    Mouth/Throat:     Mouth: Mucous membranes are moist.     Pharynx: Oropharynx is clear. Uvula midline. Posterior oropharyngeal erythema present. No oropharyngeal exudate or uvula swelling.     Comments: Uvula is midline and nonedematous.  No edema or exudates.  No peritonsillar abscess. Neck:     Musculoskeletal: Normal range of motion.  Cardiovascular:     Rate and Rhythm: Normal rate and regular rhythm.     Pulses: Normal pulses.  Pulmonary:     Effort: Pulmonary effort is normal.     Breath sounds: Normal breath sounds. No wheezing or rhonchi.  Abdominal:     General: There is no distension.     Palpations: Abdomen is soft.     Tenderness: There is no abdominal tenderness.  Musculoskeletal: Normal range of motion.  Lymphadenopathy:     Cervical: No cervical adenopathy.  Skin:    General: Skin is warm.     Findings: No rash.  Neurological:     General: No focal deficit present.     Mental Status: She is alert.     Sensory: No sensory deficit.     Gait: Gait normal.      ED Treatments / Results  Labs (all labs ordered are listed, but only abnormal results are displayed) Labs Reviewed  GROUP A STREP BY PCR - Abnormal; Notable for the following components:      Result Value   Group A Strep by PCR DETECTED (*)    All other components within normal limits  INFLUENZA PANEL BY PCR (TYPE A & B) - Abnormal; Notable for the following components:   Influenza B By PCR POSITIVE (*)    All other components within normal limits    EKG None  Radiology No results found.  Procedures Procedures (including critical care time)  Medications Ordered in ED Medications  acetaminophen (TYLENOL) tablet 650 mg (650 mg Oral Given 07/17/18  1439)  penicillin g benzathine (BICILLIN LA) 1200000 UNIT/2ML injection 1.2 Million Units (1.2 Million Units Intramuscular Given 07/17/18 1545)  ibuprofen (ADVIL,MOTRIN) tablet 800 mg (800 mg Oral Given 07/17/18 1545)     Initial Impression / Assessment and Plan / ED Course  I have reviewed the triage vital signs and the nursing notes.  Pertinent labs & imaging results that were available during  my care of the patient were reviewed by me and considered in my medical decision making (see chart for details).     Patient is nontoxic-appearing.  Vital signs reviewed.  Fever improved after Tylenol.  Airways patent.  No concerning symptoms for peritonsillar abscess.  Strep and influenza testing are positive.  Patient given Bicillin IM here.  Also agrees to treatment with Tamiflu and ibuprofen.  She appears appropriate for discharge home, return precautions discussed.  Final Clinical Impressions(s) / ED Diagnoses   Final diagnoses:  Influenza  Strep pharyngitis    ED Discharge Orders    None       Rosey Bathriplett, Jaelynn Currier, PA-C 07/17/18 1603    Gerhard MunchLockwood, Robert, MD 07/17/18 2351

## 2019-02-15 ENCOUNTER — Other Ambulatory Visit: Payer: BLUE CROSS/BLUE SHIELD

## 2019-02-15 ENCOUNTER — Other Ambulatory Visit: Payer: Self-pay

## 2019-02-15 DIAGNOSIS — Z20822 Contact with and (suspected) exposure to covid-19: Secondary | ICD-10-CM

## 2019-02-17 LAB — NOVEL CORONAVIRUS, NAA: SARS-CoV-2, NAA: NOT DETECTED

## 2022-07-21 NOTE — L&D Delivery Note (Signed)
OB/GYN Faculty Practice Delivery Note  Kristina Scott is a 32 y.o. G3P2002 s/p SVD at [redacted]w[redacted]d. She was admitted for IOL for severe CHTN.   ROM: 5h 73m with clear fluid GBS Status:  Negative/-- (10/04 0245) Maximum Maternal Temperature: 98.15F  Labor Progress: Initial SVE: 2/50/-3. Augmented with pitocin. FSE and IUPC placed for amnioinfusion due to recurrent variables 1630. She rapidly progressed to complete at 2141.   Delivery Date/Time: 05/08/23 @2148   Delivery: Called to room and patient was complete and pushing. Head delivered ROA. Loose nuchal cord present, delivered through. Shoulder and body delivered in usual fashion. Infant with spontaneous cry, placed on mother's abdomen, dried and stimulated. Cord clamped x 2 after 1-minute delay, and cut by FOB. Cord blood drawn. Placenta delivered spontaneously with gentle cord traction. Fundus firm with massage and Pitocin. Labia, perineum, vagina, and cervix inspected without injury.   Baby Weight: 2560g  Placenta: 3 vessel, intact. Sent to L&D Complications: None Lacerations: None EBL: 62 mL Analgesia: NOne  Infant:  APGAR (1 MIN): 9  APGAR (5 MINS): 9   [name], MD OB Family Medicine Fellow, [facility] for [facility], [facility] 05/08/2023, 10:47 PM

## 2022-10-16 ENCOUNTER — Ambulatory Visit: Payer: 59 | Admitting: Adult Health

## 2022-10-16 ENCOUNTER — Encounter: Payer: Self-pay | Admitting: Adult Health

## 2022-10-16 VITALS — BP 185/106 | HR 89 | Ht 64.0 in | Wt >= 6400 oz

## 2022-10-16 DIAGNOSIS — Z975 Presence of (intrauterine) contraceptive device: Secondary | ICD-10-CM | POA: Diagnosis not present

## 2022-10-16 DIAGNOSIS — Z3A01 Less than 8 weeks gestation of pregnancy: Secondary | ICD-10-CM | POA: Diagnosis not present

## 2022-10-16 DIAGNOSIS — Z3201 Encounter for pregnancy test, result positive: Secondary | ICD-10-CM | POA: Diagnosis not present

## 2022-10-16 DIAGNOSIS — O3680X0 Pregnancy with inconclusive fetal viability, not applicable or unspecified: Secondary | ICD-10-CM | POA: Insufficient documentation

## 2022-10-16 DIAGNOSIS — I10 Essential (primary) hypertension: Secondary | ICD-10-CM | POA: Insufficient documentation

## 2022-10-16 LAB — POCT URINE PREGNANCY: Preg Test, Ur: POSITIVE — AB

## 2022-10-16 MED ORDER — NIFEDIPINE ER OSMOTIC RELEASE 30 MG PO TB24: 30.0000 mg | ORAL_TABLET | Freq: Every day | ORAL | 6 refills | Status: DC

## 2022-10-16 MED ORDER — ASPIRIN 81 MG PO TBEC: DELAYED_RELEASE_TABLET | ORAL | 12 refills | Status: DC

## 2022-10-16 MED ORDER — PRENATAL PLUS 27-1 MG PO TABS: 1.0000 | ORAL_TABLET | Freq: Every day | ORAL | 12 refills | Status: DC

## 2022-10-16 NOTE — Progress Notes (Signed)
Subjective:     Patient ID: Kristina Scott, female   DOB: [birth year], 32 y.o.   MRN: [id number]  HPI Kristina Scott is a 32 year old black female, married, G3P2002, in for a UPT, she has missed a period and had 3+HPTs. She has nexplanon in that was placed in 2016.   Review of Systems Missed period +3 HPTs Reviewed past medical,surgical, social and family history. Reviewed medications and allergies.     Objective:   Physical Exam BP (!) 185/106 (BP Location: Right Arm, Patient Position: Sitting, Cuff Size: Large)   Pulse 89   Ht 5\' 4"  (1.626 m)   Wt (!) 403 lb 9.6 oz (183.1 kg)   LMP 08/22/2022 (Approximate)   BMI 69.28 kg/m  UPT is +, about 7+6 weeks by LPM with EDD 05/29/23. Skin warm and dry. Neck: mid line trachea, normal thyroid, good ROM, no lymphadenopathy noted. Lungs: clear to ausculation bilaterally. Cardiovascular: regular rate and rhythm.    AA is 2 Fall risk is low    10/16/2022    1:57 PM  Depression screen PHQ 2/9  Decreased Interest 0  Down, Depressed, Hopeless 0  PHQ - 2 Score 0  Altered sleeping 0  Tired, decreased energy 2  Change in appetite 1  Feeling bad or failure about yourself  0  Trouble concentrating 0  Moving slowly or fidgety/restless 0  Suicidal thoughts 0  PHQ-9 Score 3       10/16/2022    1:57 PM  GAD 7 : Generalized Anxiety Score  Nervous, Anxious, on Edge 0  Control/stop worrying 0  Worry too much - different things 0  Trouble relaxing 0  Restless 0  Easily annoyed or irritable 0  Afraid - awful might happen 0  Total GAD 7 Score 0      Upstream - 10/16/22 1408       Pregnancy Intention Screening   Does the patient want to become pregnant in the next year? N/A    Does the patient's partner want to become pregnant in the next year? N/A    Would the patient like to discuss contraceptive options today? N/A      Contraception Wrap Up   Current Method Pregnant/Seeking Pregnancy    End Method Pregnant/Seeking Pregnancy     Contraception Counseling Provided No             Assessment:     1. Pregnancy examination or test, positive result Check QHCG - POCT urine pregnancy - Beta hCG quant (ref lab)  2. Less than [redacted] weeks gestation of pregnancy Will rx PNV  3. Encounter to determine fetal viability of pregnancy, single or unspecified fetus Return in 1 week for dating [place]  - US OB Comp Less 14 Wks; Future  4. Hypertension, unspecified type Discussed with Dr Nelda Marseille Will rx procardia XL 30 1 daily and ASA 81 mg 2 daily Meds ordered this encounter  Medications   prenatal vitamin w/FE, FA (PRENATAL 1 + 1) 27-1 MG TABS tablet    Sig: Take 1 tablet by mouth daily at 12 noon.    Dispense:  30 tablet    Refill:  12    Order Specific Question:   Supervising Provider    Answer:   Tania Ade H [[id number]]   NIFEdipine (PROCARDIA XL) 30 MG 24 hr tablet    Sig: Take 1 tablet (30 mg total) by mouth daily.    Dispense:  30 tablet    Refill:  6  Order Specific Question:   Supervising Provider    Answer:   Florian Buff [[id number]]   aspirin EC 81 MG tablet    Sig: Take 2 daily    Dispense:  60 tablet    Refill:  12    Order Specific Question:   Supervising Provider    Answer:   Elonda Husky, LUTHER H [[id number]]     5. Nexplanon in place Placed 2016 Will scheduled removal in near future after [place]   6. Morbid obesity ([place])     Plan:     Review OB packet

## 2022-10-24 ENCOUNTER — Other Ambulatory Visit: Payer: 59

## 2022-10-24 LAB — BETA HCG QUANT (REF LAB): hCG Quant: 46861 m[IU]/mL

## 2022-10-30 ENCOUNTER — Ambulatory Visit (INDEPENDENT_AMBULATORY_CARE_PROVIDER_SITE_OTHER): Payer: 59

## 2022-10-30 DIAGNOSIS — Z3A09 9 weeks gestation of pregnancy: Secondary | ICD-10-CM

## 2022-10-30 DIAGNOSIS — O3680X0 Pregnancy with inconclusive fetal viability, not applicable or unspecified: Secondary | ICD-10-CM

## 2022-10-30 DIAGNOSIS — Z3481 Encounter for supervision of other normal pregnancy, first trimester: Secondary | ICD-10-CM

## 2022-10-30 NOTE — Progress Notes (Signed)
[place] 9+6 wks,single IUP,CRL 31.34 mm,FHR 166 bpm,normal ovaries

## 2022-11-06 DIAGNOSIS — Z3482 Encounter for supervision of other normal pregnancy, second trimester: Secondary | ICD-10-CM | POA: Diagnosis not present

## 2022-11-06 DIAGNOSIS — Z3483 Encounter for supervision of other normal pregnancy, third trimester: Secondary | ICD-10-CM | POA: Diagnosis not present

## 2022-11-20 ENCOUNTER — Encounter: Payer: Self-pay | Admitting: Women's Health

## 2022-11-21 ENCOUNTER — Other Ambulatory Visit: Payer: Self-pay | Admitting: Obstetrics & Gynecology

## 2022-11-21 DIAGNOSIS — Z3682 Encounter for antenatal screening for nuchal translucency: Secondary | ICD-10-CM

## 2022-11-24 ENCOUNTER — Ambulatory Visit (INDEPENDENT_AMBULATORY_CARE_PROVIDER_SITE_OTHER): Payer: 59

## 2022-11-24 ENCOUNTER — Encounter: Payer: Self-pay | Admitting: Women's Health

## 2022-11-24 ENCOUNTER — Other Ambulatory Visit (HOSPITAL_COMMUNITY)
Admission: RE | Admit: 2022-11-24 | Discharge: 2022-11-24 | Disposition: A | Discharge status: Home / Self Care | Payer: 59 | Source: Ambulatory Visit | Attending: Women's Health | Admitting: Women's Health

## 2022-11-24 ENCOUNTER — Encounter: Payer: 59 | Admitting: *Deleted

## 2022-11-24 ENCOUNTER — Ambulatory Visit (INDEPENDENT_AMBULATORY_CARE_PROVIDER_SITE_OTHER): Payer: 59 | Admitting: Women's Health

## 2022-11-24 VITALS — BP 173/114 | HR 108 | Wt 394.0 lb

## 2022-11-24 DIAGNOSIS — Z124 Encounter for screening for malignant neoplasm of cervix: Secondary | ICD-10-CM | POA: Insufficient documentation

## 2022-11-24 DIAGNOSIS — Z975 Presence of (intrauterine) contraceptive device: Secondary | ICD-10-CM | POA: Diagnosis not present

## 2022-11-24 DIAGNOSIS — Z3A13 13 weeks gestation of pregnancy: Secondary | ICD-10-CM

## 2022-11-24 DIAGNOSIS — O10911 Unspecified pre-existing hypertension complicating pregnancy, first trimester: Secondary | ICD-10-CM

## 2022-11-24 DIAGNOSIS — I10 Essential (primary) hypertension: Secondary | ICD-10-CM | POA: Diagnosis not present

## 2022-11-24 DIAGNOSIS — Z131 Encounter for screening for diabetes mellitus: Secondary | ICD-10-CM | POA: Diagnosis not present

## 2022-11-24 DIAGNOSIS — Z113 Encounter for screening for infections with a predominantly sexual mode of transmission: Secondary | ICD-10-CM | POA: Insufficient documentation

## 2022-11-24 DIAGNOSIS — Z6841 Body Mass Index (BMI) 40.0 and over, adult: Secondary | ICD-10-CM | POA: Diagnosis not present

## 2022-11-24 DIAGNOSIS — O0991 Supervision of high risk pregnancy, unspecified, first trimester: Secondary | ICD-10-CM | POA: Diagnosis not present

## 2022-11-24 DIAGNOSIS — O09291 Supervision of pregnancy with other poor reproductive or obstetric history, first trimester: Secondary | ICD-10-CM

## 2022-11-24 DIAGNOSIS — Z3682 Encounter for antenatal screening for nuchal translucency: Secondary | ICD-10-CM | POA: Diagnosis not present

## 2022-11-24 DIAGNOSIS — R768 Other specified abnormal immunological findings in serum: Secondary | ICD-10-CM

## 2022-11-24 DIAGNOSIS — O10919 Unspecified pre-existing hypertension complicating pregnancy, unspecified trimester: Secondary | ICD-10-CM

## 2022-11-24 DIAGNOSIS — O09299 Supervision of pregnancy with other poor reproductive or obstetric history, unspecified trimester: Secondary | ICD-10-CM

## 2022-11-24 DIAGNOSIS — O0990 Supervision of high risk pregnancy, unspecified, unspecified trimester: Secondary | ICD-10-CM | POA: Insufficient documentation

## 2022-11-24 DIAGNOSIS — R7689 Other specified abnormal immunological findings in serum: Secondary | ICD-10-CM

## 2022-11-24 DIAGNOSIS — O0992 Supervision of high risk pregnancy, unspecified, second trimester: Secondary | ICD-10-CM

## 2022-11-24 NOTE — Progress Notes (Signed)
INITIAL OBSTETRICAL VISIT Patient name: Kristina Scott MRN [id number]  Date of birth: 06/30/1991 Chief Complaint:   Initial Prenatal Visit  History of Present Illness:   Kristina Scott is a 32 y.o. G63P2002 African-American female at [redacted]w[redacted]d by LMP c/w u/s at 10 weeks with an Estimated Date of Delivery: 05/29/23 being seen today for her initial obstetrical visit.   Patient's last menstrual period was 08/22/2022 (approximate). Her obstetrical history is significant for  term SVB x 2, 2nd pregnancy (2016) complicated by pre-e, normal bp at pp visit. No care since. Denies headaches, sob, cp. Just a lot of stress at work. Started on nifedipine 30mg  on 10/16/22 by Victorino Dike.  Still has Nexplanon in place from 2016.  Today she reports no complaints.  Last pap 2016. Results were:  neg     11/24/2022   11:17 AM 10/16/2022    1:57 PM  Depression screen PHQ 2/9  Decreased Interest 1 0  Down, Depressed, Hopeless 0 0  PHQ - 2 Score 1 0  Altered sleeping 0 0  Tired, decreased energy 2 2  Change in appetite 1 1  Feeling bad or failure about yourself  0 0  Trouble concentrating 0 0  Moving slowly or fidgety/restless 0 0  Suicidal thoughts 0 0  PHQ-9 Score 4 3        11/24/2022   11:17 AM 10/16/2022    1:57 PM  GAD 7 : Generalized Anxiety Score  Nervous, Anxious, on Edge 0 0  Control/stop worrying 0 0  Worry too much - different things 0 0  Trouble relaxing 0 0  Restless 0 0  Easily annoyed or irritable 2 0  Afraid - awful might happen 0 0  Total GAD 7 Score 2 0     Review of Systems:   Pertinent items are noted in HPI Denies cramping/contractions, leakage of fluid, vaginal bleeding, abnormal vaginal discharge w/ itching/odor/irritation, headaches, visual changes, shortness of breath, chest pain, abdominal pain, severe nausea/vomiting, or problems with urination or bowel movements unless otherwise stated above.  Pertinent History Reviewed:  Reviewed past medical,surgical, social,  obstetrical and family history.  Reviewed problem list, medications and allergies. OB History  Gravida Para Term Preterm AB Living  3 2 2     2   SAB IAB Ectopic Multiple Live Births        0 2    # Outcome Date GA Lbr Len/2nd Weight Sex Delivery Anes PTL Lv  3 Current           2 Term 03/07/15 [redacted]w[redacted]d 03:30 / 00:10 7 lb 1.4 oz (3.215 kg) M Vag-Spont EPI N LIV     Birth Comments: wnl     Complications: Mild pre-eclampsia  1 Term 11/29/06 [redacted]w[redacted]d  7 lb 6 oz (3.345 kg) F Vag-Spont  N LIV   Physical Assessment:   Vitals:   11/24/22 1105 11/24/22 1142  BP: (!) 177/114 (!) 173/114  Pulse: (!) 108   Weight: (!) 394 lb (178.7 kg)   Body mass index is 67.63 kg/m.       Physical Examination:  General appearance - well appearing, and in no distress  Mental status - alert, oriented to person, place, and time  Psych:  She has a normal mood and affect  Skin - warm and dry, normal color, no suspicious lesions noted  Chest - effort normal, all lung fields clear to auscultation bilaterally   Heart - normal rate and regular rhythm  Abdomen -  soft, nontender  Extremities:  No swelling or varicosities noted  Pelvic - VULVA: normal appearing vulva with no masses, tenderness or lesions  VAGINA: normal appearing vagina with normal color and discharge, no lesions  CERVIX: normal appearing cervix without discharge or lesions, no CMT  Thin prep pap is done w/ HR HPV cotesting  Chaperone: Latisha Cresenzo    TODAY'S NT [place] 13+3 wks,measurements c/w dates,posterior placenta,normal ovaries,NB present,NT 1.6 mm,FHR 157 bpm,CRL 72.63 mm   No results found for this or any previous visit (from the past 24 hour(s)).  Assessment & Plan:  1) High-Risk Pregnancy G3P2002 at [redacted]w[redacted]d with an Estimated Date of Delivery: 05/29/23   2) Initial OB visit  3) Uncontrolled CHTN> started on nifedipine 30mg  daily on 10/16/22, discussed w/ Dr. Despina Hidden today, increase to 60mg  daily. F/U 1wk for nurse bp check, take meds at least  1-2hr before visit. Continue ASA 162mg  daily. OB cards referral ordered  4) Pregravid BMI 69  Meds: No orders of the defined types were placed in this encounter.   Initial labs obtained Continue prenatal vitamins Reviewed n/v relief measures and warning s/s to report Reviewed recommended weight gain based on pre-gravid BMI Encouraged well-balanced diet Genetic & carrier screening discussed: requests Panorama, NT/IT, and Horizon ([facility] only) Ultrasound discussed; fetal survey: requested CCNC completed> form faxed if has or is planning to apply for medicaid The nature of [facility] [employer] with multiple MDs and other Advanced Practice Providers was explained to patient; also emphasized that fellows, residents, and students are part of our team. Does not have home bp cuff. Office bp cuff given: yes. Rx sent: no. Check bp weekly, let us know if consistently >140/90.   Follow-up: Return for 1wk bp check w/ nurse; Phill Myron w/ MD 2nd IT; 7wks HROB and anatomy u/s.   Orders Placed This Encounter  Procedures   Urine Culture   Integrated 1   Hemoglobin A1c   PANORAMA PRENATAL TEST FULL PANEL   Protein / creatinine ratio, urine   CBC/D/Plt+RPR+Rh+ABO+RubIgG...   Comprehensive metabolic panel   Horizon [facility]   AMB Referral to Cardio Obstetrics    Cheral Marker CNM, [facility] 11/24/2022 2:20 PM

## 2022-11-24 NOTE — Patient Instructions (Addendum)
Kristina Scott, thank you for choosing our office today! We appreciate the opportunity to meet your healthcare needs. You may receive a short survey by mail, e-mail, or through [employer]. If you are happy with your care we would appreciate if you could take just a few minutes to complete the survey questions. We read all of your comments and take your feedback very seriously. Thank you again for choosing our office.  Center for [employer] [facility] at [facility]  [facility] at [facility] ([address] [place], [place] [address]) Entrance C, located off of [address] 24/7 valet parking   Increase nifedipine to 60mg  daily   Nausea & Vomiting Have saltine crackers or pretzels by your bed and eat a few bites before you raise your head out of bed in the morning Eat small frequent meals throughout the day instead of large meals Drink plenty of fluids throughout the day to stay hydrated, just don't drink a lot of fluids with your meals.  This can make your stomach fill up faster making you feel sick Do not brush your teeth right after you eat Products with real ginger are good for nausea, like ginger ale and ginger hard candy Make sure it says made with real ginger! Sucking on sour candy like lemon heads is also good for nausea If your prenatal vitamins make you nauseated, take them at night so you will sleep through the nausea Sea Bands If you feel like you need medicine for the nausea & vomiting please let us know If you are unable to keep any fluids or food down please let us know   Constipation Drink plenty of fluid, preferably water, throughout the day Eat foods high in fiber such as fruits, vegetables, and grains Exercise, such as walking, is a good way to keep your bowels regular Drink warm fluids, especially warm prune juice, or decaf coffee Eat a 1/2 cup of real oatmeal (not instant), 1/2 cup applesauce, and 1/2-1 cup warm prune juice every day If needed, you may take  Colace (docusate sodium) stool softener once or twice a day to help keep the stool soft.  If you still are having problems with constipation, you may take Miralax once daily as needed to help keep your bowels regular.   Home Blood Pressure Monitoring for Patients   Your provider has recommended that you check your blood pressure (BP) at least once a week at home. If you do not have a blood pressure cuff at home, one will be provided for you. Contact your provider if you have not received your monitor within 1 week.   Helpful Tips for Accurate Home Blood Pressure Checks  Don't smoke, exercise, or drink caffeine 30 minutes before checking your BP Use the restroom before checking your BP (a full bladder can raise your pressure) Relax in a comfortable upright chair Feet on the ground Left arm resting comfortably on a flat surface at the level of your heart Legs uncrossed Back supported Sit quietly and don't talk Place the cuff on your bare arm Adjust snuggly, so that only two fingertips can fit between your skin and the top of the cuff Check 2 readings separated by at least one minute Keep a log of your BP readings For a visual, please reference this diagram: http://ccnc.care/bpdiagram  Provider Name: [facility]     Phone: [phone]  Zone 1: ALL CLEAR  Continue to monitor your symptoms:  BP reading is less than 140 (  top number) or less than 90 (bottom number)  No right upper stomach pain No headaches or seeing spots No feeling nauseated or throwing up No swelling in face and hands  Zone 2: CAUTION Call your doctor's office for any of the following:  BP reading is greater than 140 (top number) or greater than 90 (bottom number)  Stomach pain under your ribs in the middle or right side Headaches or seeing spots Feeling nauseated or throwing up Swelling in face and hands  Zone 3: EMERGENCY  Seek immediate medical care if you have any of the following:  BP reading is  greater than160 (top number) or greater than 110 (bottom number) Severe headaches not improving with Tylenol Serious difficulty catching your breath Any worsening symptoms from Zone 2    First Trimester of Pregnancy The first trimester of pregnancy is from week 1 until the end of week 12 (months 1 through 3). A week after a sperm fertilizes an egg, the egg will implant on the wall of the uterus. This embryo will begin to develop into a baby. Genes from you and your partner are forming the baby. The female genes determine whether the baby is a boy or a girl. At 6-8 weeks, the eyes and face are formed, and the heartbeat can be seen on ultrasound. At the end of 12 weeks, all the baby's organs are formed.  Now that you are pregnant, you will want to do everything you can to have a healthy baby. Two of the most important things are to get good prenatal care and to follow your health care provider's instructions. Prenatal care is all the medical care you receive before the baby's birth. This care will help prevent, find, and treat any problems during the pregnancy and childbirth. BODY CHANGES Your body goes through many changes during pregnancy. The changes vary from woman to woman.  You may gain or lose a couple of pounds at first. You may feel sick to your stomach (nauseous) and throw up (vomit). If the vomiting is uncontrollable, call your health care provider. You may tire easily. You may develop headaches that can be relieved by medicines approved by your health care provider. You may urinate more often. Painful urination may mean you have a bladder infection. You may develop heartburn as a result of your pregnancy. You may develop constipation because certain hormones are causing the muscles that push waste through your intestines to slow down. You may develop hemorrhoids or swollen, bulging veins (varicose veins). Your breasts may begin to grow larger and become tender. Your nipples may stick out  more, and the tissue that surrounds them (areola) may become darker. Your gums may bleed and may be sensitive to brushing and flossing. Dark spots or blotches (chloasma, mask of pregnancy) may develop on your face. This will likely fade after the baby is born. Your menstrual periods will stop. You may have a loss of appetite. You may develop cravings for certain kinds of food. You may have changes in your emotions from day to day, such as being excited to be pregnant or being concerned that something may go wrong with the pregnancy and baby. You may have more vivid and strange dreams. You may have changes in your hair. These can include thickening of your hair, rapid growth, and changes in texture. Some women also have hair loss during or after pregnancy, or hair that feels dry or thin. Your hair will most likely return to normal after your baby is  born. WHAT TO EXPECT AT YOUR PRENATAL VISITS During a routine prenatal visit: You will be weighed to make sure you and the baby are growing normally. Your blood pressure will be taken. Your abdomen will be measured to track your baby's growth. The fetal heartbeat will be listened to starting around week 10 or 12 of your pregnancy. Test results from any previous visits will be discussed. Your health care provider may ask you: How you are feeling. If you are feeling the baby move. If you have had any abnormal symptoms, such as leaking fluid, bleeding, severe headaches, or abdominal cramping. If you have any questions. Other tests that may be performed during your first trimester include: Blood tests to find your blood type and to check for the presence of any previous infections. They will also be used to check for low iron levels (anemia) and Rh antibodies. Later in the pregnancy, blood tests for diabetes will be done along with other tests if problems develop. Urine tests to check for infections, diabetes, or protein in the urine. An ultrasound to  confirm the proper growth and development of the baby. An amniocentesis to check for possible genetic problems. Fetal screens for spina bifida and Down syndrome. You may need other tests to make sure you and the baby are doing well. HOME CARE INSTRUCTIONS  Medicines Follow your health care provider's instructions regarding medicine use. Specific medicines may be either safe or unsafe to take during pregnancy. Take your prenatal vitamins as directed. If you develop constipation, try taking a stool softener if your health care provider approves. Diet Eat regular, well-balanced meals. Choose a variety of foods, such as meat or vegetable-based protein, fish, milk and low-fat dairy products, vegetables, fruits, and whole grain breads and cereals. Your health care provider will help you determine the amount of weight gain that is right for you. Avoid raw meat and uncooked cheese. These carry germs that can cause birth defects in the baby. Eating four or five small meals rather than three large meals a day may help relieve nausea and vomiting. If you start to feel nauseous, eating a few soda crackers can be helpful. Drinking liquids between meals instead of during meals also seems to help nausea and vomiting. If you develop constipation, eat more high-fiber foods, such as fresh vegetables or fruit and whole grains. Drink enough fluids to keep your urine clear or pale yellow. Activity and Exercise Exercise only as directed by your health care provider. Exercising will help you: Control your weight. Stay in shape. Be prepared for labor and delivery. Experiencing pain or cramping in the lower abdomen or low back is a good sign that you should stop exercising. Check with your health care provider before continuing normal exercises. Try to avoid standing for long periods of time. Move your legs often if you must stand in one place for a long time. Avoid heavy lifting. Wear low-heeled shoes, and practice  good posture. You may continue to have sex unless your health care provider directs you otherwise. Relief of Pain or Discomfort Wear a good support bra for breast tenderness.   Take warm sitz baths to soothe any pain or discomfort caused by hemorrhoids. Use hemorrhoid cream if your health care provider approves.   Rest with your legs elevated if you have leg cramps or low back pain. If you develop varicose veins in your legs, wear support hose. Elevate your feet for 15 minutes, 3-4 times a day. Limit salt in your diet. Prenatal  Care Schedule your prenatal visits by the twelfth week of pregnancy. They are usually scheduled monthly at first, then more often in the last 2 months before delivery. Write down your questions. Take them to your prenatal visits. Keep all your prenatal visits as directed by your health care provider. Safety Wear your seat belt at all times when driving. Make a list of emergency phone numbers, including numbers for family, friends, the hospital, and police and fire departments. General Tips Ask your health care provider for a referral to a local prenatal education class. Begin classes no later than at the beginning of month 6 of your pregnancy. Ask for help if you have counseling or nutritional needs during pregnancy. Your health care provider can offer advice or refer you to specialists for help with various needs. Do not use hot tubs, steam rooms, or saunas. Do not douche or use tampons or scented sanitary pads. Do not cross your legs for long periods of time. Avoid cat litter boxes and soil used by cats. These carry germs that can cause birth defects in the baby and possibly loss of the fetus by miscarriage or stillbirth. Avoid all smoking, herbs, alcohol, and medicines not prescribed by your health care provider. Chemicals in these affect the formation and growth of the baby. Schedule a dentist appointment. At home, brush your teeth with a soft toothbrush and be  gentle when you floss. SEEK MEDICAL CARE IF:  You have dizziness. You have mild pelvic cramps, pelvic pressure, or nagging pain in the abdominal area. You have persistent nausea, vomiting, or diarrhea. You have a bad smelling vaginal discharge. You have pain with urination. You notice increased swelling in your face, hands, legs, or ankles. SEEK IMMEDIATE MEDICAL CARE IF:  You have a fever. You are leaking fluid from your vagina. You have spotting or bleeding from your vagina. You have severe abdominal cramping or pain. You have rapid weight gain or loss. You vomit blood or material that looks like coffee grounds. You are exposed to [place] measles and have never had them. You are exposed to fifth disease or chickenpox. You develop a severe headache. You have shortness of breath. You have any kind of trauma, such as from a fall or a car accident. Document Released: 07/01/2001 Document Revised: 11/21/2013 Document Reviewed: 05/17/2013 [facility] Patient Information 2015 [place], [place]. This information is not intended to replace advice given to you by your health care provider. Make sure you discuss any questions you have with your health care provider.

## 2022-11-24 NOTE — Progress Notes (Signed)
[place] 13+3 wks,measurements c/w dates,posterior placenta,normal ovaries,NB present,NT 1.6 mm,FHR 157 bpm,CRL 72.63 mm

## 2022-11-25 LAB — CBC/D/PLT+RPR+RH+ABO+RUBIGG...: Neutrophils: 67 %

## 2022-11-25 LAB — INTEGRATED 1

## 2022-11-26 LAB — PROTEIN / CREATININE RATIO, URINE: Creatinine, Urine: 280.7 mg/dL

## 2022-11-26 LAB — URINE CULTURE

## 2022-11-26 LAB — CBC/D/PLT+RPR+RH+ABO+RUBIGG...
EOS (ABSOLUTE): 0.1 10*3/uL (ref 0.0–0.4)
HIV Screen 4th Generation wRfx: NONREACTIVE
RPR Ser Ql: NONREACTIVE

## 2022-11-26 LAB — COMPREHENSIVE METABOLIC PANEL
Albumin: 4.1 g/dL (ref 3.9–4.9)
Globulin, Total: 3.7 g/dL (ref 1.5–4.5)
Glucose: 76 mg/dL (ref 70–99)

## 2022-11-27 LAB — CYTOLOGY - PAP
Chlamydia: NEGATIVE
Comment: NEGATIVE
Comment: NEGATIVE
Comment: NORMAL
Diagnosis: NEGATIVE
High risk HPV: NEGATIVE
Neisseria Gonorrhea: NEGATIVE

## 2022-11-28 ENCOUNTER — Ambulatory Visit (INDEPENDENT_AMBULATORY_CARE_PROVIDER_SITE_OTHER): Payer: 59 | Admitting: *Deleted

## 2022-11-28 ENCOUNTER — Encounter: Payer: Self-pay | Admitting: *Deleted

## 2022-11-28 VITALS — BP 151/95 | HR 87 | Ht 64.0 in | Wt 392.0 lb

## 2022-11-28 DIAGNOSIS — O0991 Supervision of high risk pregnancy, unspecified, first trimester: Secondary | ICD-10-CM

## 2022-11-28 DIAGNOSIS — Z0130 Encounter for examination of blood pressure without abnormal findings: Secondary | ICD-10-CM

## 2022-11-28 NOTE — Progress Notes (Signed)
   NURSE VISIT- BLOOD PRESSURE CHECK  SUBJECTIVE:  Kristina Scott is a 32 y.o. G56P2002 female here for BP check. She is [redacted]w[redacted]d pregnant    HYPERTENSION ROS:  Pregnant/postpartum:  Severe headaches that don't go away with tylenol/other medicines: No  Visual changes (seeing spots/double/blurred vision) No  Severe pain under right breast breast or in center of upper chest No  Severe nausea/vomiting No  Taking medicines as instructed yes    OBJECTIVE:  BP (!) 153/107   Pulse 90   Ht 5\' 4"  (1.626 m)   Wt (!) 392 lb (177.8 kg)   LMP 08/22/2022 (Approximate)   BMI 67.29 kg/m  . Pt's BP was 151/95 after sitting for a while.  Appearance alert, well appearing, and in no distress.  ASSESSMENT: Pregnancy [redacted]w[redacted]d  blood pressure check  PLAN: Discussed with Dr. Despina Hidden   Recommendations:  nurse video visit on Monday; check BP over weekend and record readings    Follow-up:  on Monday, 5/13    Malachy Mood  11/28/2022 11:39 AM

## 2022-12-01 ENCOUNTER — Ambulatory Visit (INDEPENDENT_AMBULATORY_CARE_PROVIDER_SITE_OTHER): Payer: 59 | Admitting: *Deleted

## 2022-12-01 VITALS — BP 157/107 | HR 93

## 2022-12-01 DIAGNOSIS — O0991 Supervision of high risk pregnancy, unspecified, first trimester: Secondary | ICD-10-CM

## 2022-12-01 DIAGNOSIS — Z3A14 14 weeks gestation of pregnancy: Secondary | ICD-10-CM

## 2022-12-01 DIAGNOSIS — O09292 Supervision of pregnancy with other poor reproductive or obstetric history, second trimester: Secondary | ICD-10-CM

## 2022-12-01 DIAGNOSIS — O0992 Supervision of high risk pregnancy, unspecified, second trimester: Secondary | ICD-10-CM

## 2022-12-01 DIAGNOSIS — O09299 Supervision of pregnancy with other poor reproductive or obstetric history, unspecified trimester: Secondary | ICD-10-CM

## 2022-12-01 DIAGNOSIS — I10 Essential (primary) hypertension: Secondary | ICD-10-CM

## 2022-12-01 LAB — POCT URINALYSIS DIPSTICK OB
Blood, UA: NEGATIVE
Glucose, UA: NEGATIVE
Ketones, UA: NEGATIVE
Nitrite, UA: NEGATIVE
POC,PROTEIN,UA: NEGATIVE

## 2022-12-01 MED ORDER — NIFEDIPINE ER 90 MG PO TB24: 90.0000 mg | ORAL_TABLET | Freq: Every day | ORAL | 6 refills | Status: DC

## 2022-12-01 NOTE — Progress Notes (Addendum)
   NURSE VISIT- BLOOD PRESSURE CHECK  SUBJECTIVE:  Kristina Scott is a 32 y.o. G76P2002 female here for BP check. She is [redacted]w[redacted]d pregnant    HYPERTENSION ROS:  Pregnant/postpartum:  Severe headaches that don't go away with tylenol/other medicines: No  Visual changes (seeing spots/double/blurred vision) No  Severe pain under right breast breast or in center of upper chest No  Severe nausea/vomiting No  Taking medicines as instructed yes    OBJECTIVE:  BP (!) 157/107   Pulse 93   LMP 08/22/2022 (Approximate)   Appearance alert, well appearing, and in no distress.  ASSESSMENT: Pregnancy [redacted]w[redacted]d  blood pressure check  PLAN: Discussed with Joellyn Haff, CNM, [facility]   Recommendations:  Increase Nifedipine to 90mg  daily.     Follow-up:  1 week for BP Check    Annamarie Dawley  12/01/2022 11:50 AM   Chart reviewed for nurse visit. Agree with plan of care. 1st bp 171/112, repeat 157/107, took nifedipine 60mg  1hr ago. Reviewed w/ Dr. Despina Hidden, increase nifedipine to 90mg  daily, f/u 1wk for bp check w/ nurse. OB cards referral last week, don't see appt scheduled yet. Emma to check on this.  Cheral Marker, [place] 12/01/2022 12:24 PM

## 2022-12-01 NOTE — Addendum Note (Signed)
Addended by: Shawna Clamp R on: 12/01/2022 12:25 PM   Modules accepted: Orders

## 2022-12-02 LAB — HORIZON SMA
REPORT SUMMARY: NEGATIVE
SPINAL MUSCULAR ATROPHY: NEGATIVE

## 2022-12-03 LAB — PANORAMA PRENATAL TEST FULL PANEL:PANORAMA TEST PLUS 5 ADDITIONAL MICRODELETIONS: FETAL FRACTION: 3

## 2022-12-08 ENCOUNTER — Ambulatory Visit (INDEPENDENT_AMBULATORY_CARE_PROVIDER_SITE_OTHER): Payer: 59 | Admitting: *Deleted

## 2022-12-08 VITALS — BP 155/102 | HR 91

## 2022-12-08 DIAGNOSIS — O0992 Supervision of high risk pregnancy, unspecified, second trimester: Secondary | ICD-10-CM

## 2022-12-08 DIAGNOSIS — Z3A15 15 weeks gestation of pregnancy: Secondary | ICD-10-CM

## 2022-12-08 DIAGNOSIS — I10 Essential (primary) hypertension: Secondary | ICD-10-CM

## 2022-12-08 MED ORDER — LABETALOL HCL 200 MG PO TABS: 200.0000 mg | ORAL_TABLET | Freq: Three times a day (TID) | ORAL | 3 refills | Status: DC

## 2022-12-08 NOTE — Progress Notes (Signed)
   NURSE VISIT- BLOOD PRESSURE CHECK  SUBJECTIVE:  Kristina Scott is a 32 y.o. G60P2002 female here for BP check. She is [redacted]w[redacted]d pregnant    HYPERTENSION ROS:  Pregnant:  Severe headaches that don't go away with tylenol/other medicines: No  Visual changes (seeing spots/double/blurred vision) No  Severe pain under right breast breast or in center of upper chest No  Severe nausea/vomiting No  Taking medicines as instructed yes   OBJECTIVE:  BP (!) 155/102   Pulse 91   LMP 08/22/2022 (Approximate)   Appearance alert, well appearing, and in no distress.  ASSESSMENT: Pregnancy [redacted]w[redacted]d  blood pressure check  PLAN: Discussed with Dr. Despina Hidden   Recommendations: new prescription will be sent to take in addition to Nifedipine Follow-up: as scheduled   [name]  12/08/2022 3:39 PM

## 2022-12-09 ENCOUNTER — Encounter: Payer: Self-pay | Admitting: Obstetrics & Gynecology

## 2022-12-19 ENCOUNTER — Ambulatory Visit (INDEPENDENT_AMBULATORY_CARE_PROVIDER_SITE_OTHER): Payer: 59 | Admitting: Obstetrics & Gynecology

## 2022-12-19 ENCOUNTER — Encounter: Payer: Self-pay | Admitting: Obstetrics & Gynecology

## 2022-12-19 VITALS — BP 154/91 | HR 100 | Wt 389.6 lb

## 2022-12-19 DIAGNOSIS — O0992 Supervision of high risk pregnancy, unspecified, second trimester: Secondary | ICD-10-CM

## 2022-12-19 DIAGNOSIS — Z6841 Body Mass Index (BMI) 40.0 and over, adult: Secondary | ICD-10-CM

## 2022-12-19 DIAGNOSIS — I10 Essential (primary) hypertension: Secondary | ICD-10-CM

## 2022-12-19 DIAGNOSIS — Z3A17 17 weeks gestation of pregnancy: Secondary | ICD-10-CM

## 2022-12-19 MED ORDER — LABETALOL HCL 300 MG PO TABS: 300.0000 mg | ORAL_TABLET | Freq: Two times a day (BID) | ORAL | 11 refills | Status: DC

## 2022-12-19 NOTE — Progress Notes (Signed)
HIGH-RISK PREGNANCY VISIT Patient name: Kristina Scott MRN [id number]  Date of birth: 06-26-1991 Chief Complaint:   Routine Prenatal Visit  History of Present Illness:   Kristina Scott is a 32 y.o. G37P2002 female at [redacted]w[redacted]d with an Estimated Date of Delivery: 05/29/23 being seen today for ongoing management of a high-risk pregnancy complicated by:  -Uncontrolled HTN- currently on Procardia 90mg  daily and Labetalol 200mg  TID Pt notes at home   -morbid obesity  Today she reports no complaints.   Contractions: Not present.  Not yet feeling fetal movement.  Movement: Absent. denies leaking of fluid.      11/24/2022   11:17 AM 10/16/2022    1:57 PM  Depression screen PHQ 2/9  Decreased Interest 1 0  Down, Depressed, Hopeless 0 0  PHQ - 2 Score 1 0  Altered sleeping 0 0  Tired, decreased energy 2 2  Change in appetite 1 1  Feeling bad or failure about yourself  0 0  Trouble concentrating 0 0  Moving slowly or fidgety/restless 0 0  Suicidal thoughts 0 0  PHQ-9 Score 4 3     Current Outpatient Medications  Medication Instructions   acetaminophen (TYLENOL) 650 mg, Oral, Every 6 hours PRN   aspirin EC 81 MG tablet Take 2 daily   labetalol (NORMODYNE) 300 mg, Oral, 2 times daily   NIFEdipine (ADALAT CC) 90 mg, Oral, Daily   prenatal vitamin w/FE, FA (PRENATAL 1 + 1) 27-1 MG TABS tablet 1 tablet, Oral, Daily     Review of Systems:   Pertinent items are noted in HPI Denies abnormal vaginal discharge w/ itching/odor/irritation, headaches, visual changes, shortness of breath, chest pain, abdominal pain, severe nausea/vomiting, or problems with urination or bowel movements unless otherwise stated above. Pertinent History Reviewed:  Reviewed past medical,surgical, social, obstetrical and family history.  Reviewed problem list, medications and allergies. Physical Assessment:   Vitals:   12/19/22 1108 12/19/22 1120  BP: (!) 154/96 (!) 154/91  Pulse: 96 100  Weight: (!) 389 lb  9.6 oz (176.7 kg)   Body mass index is 66.87 kg/m.           Physical Examination:   General appearance: alert, well appearing, and in no distress  Mental status: normal mood, behavior, speech, dress, motor activity, and thought processes  Skin: warm & dry   Extremities:      Cardiovascular: normal heart rate noted  Respiratory: normal respiratory effort, no distress  Abdomen: gravid, soft, non-tender  Pelvic: Cervical exam deferred         Fetal Status:     Movement: Absent    Fetal Surveillance Testing today: Bedside US, confirmed +FHT   Chaperone: N/A    No results found for this or any previous visit (from the past 24 hour(s)).   Assessment & Plan:  High-risk pregnancy: G3P2002 at [redacted]w[redacted]d with an Estimated Date of Delivery: 05/29/23   1) Uncontrolled HTN -plan to increased Labetalol to 300mg  bid -continue Procardia  2) morbid obesity  Meds:  Meds ordered this encounter  Medications   labetalol (NORMODYNE) 300 MG tablet    Sig: Take 1 tablet (300 mg total) by mouth 2 (two) times daily.    Dispense:  60 tablet    Refill:  11    Labs/procedures today: none  Treatment Plan:  continue routine OB care, anatomy scan scheduled  Reviewed: Preterm labor symptoms and general obstetric precautions including but not limited to vaginal bleeding, contractions, leaking of fluid and  fetal movement were reviewed in detail with the patient.  All questions were answered. PT has home bp cuff. Check bp weekly, let us know if >140/90.   Follow-up: Return for as scheduled 6/21.   Future Appointments  Date Time Provider Department Center  01/09/2023 10:45 AM [facility] [place] [facility] None  01/09/2023 11:30 AM Lazaro Arms, MD [facility]  01/23/2023  4:20 PM Meriam Sprague, MD [facility] None    No orders of the defined types were placed in this encounter.   Kristina Hidalgo, DO Attending Obstetrician & Gynecologist, [facility] for [employer], [facility]

## 2022-12-29 ENCOUNTER — Encounter: Payer: Self-pay | Admitting: Obstetrics & Gynecology

## 2023-01-01 ENCOUNTER — Encounter: Payer: Self-pay | Admitting: Obstetrics & Gynecology

## 2023-01-08 ENCOUNTER — Other Ambulatory Visit: Payer: Self-pay | Admitting: Women's Health

## 2023-01-08 DIAGNOSIS — Z363 Encounter for antenatal screening for malformations: Secondary | ICD-10-CM

## 2023-01-09 ENCOUNTER — Ambulatory Visit (INDEPENDENT_AMBULATORY_CARE_PROVIDER_SITE_OTHER): Payer: 59 | Admitting: Obstetrics & Gynecology

## 2023-01-09 ENCOUNTER — Encounter: Payer: Self-pay | Admitting: Obstetrics & Gynecology

## 2023-01-09 ENCOUNTER — Ambulatory Visit (INDEPENDENT_AMBULATORY_CARE_PROVIDER_SITE_OTHER): Payer: Medicaid Other

## 2023-01-09 VITALS — BP 142/88 | HR 80 | Wt 396.4 lb

## 2023-01-09 DIAGNOSIS — O0992 Supervision of high risk pregnancy, unspecified, second trimester: Secondary | ICD-10-CM

## 2023-01-09 DIAGNOSIS — Z363 Encounter for antenatal screening for malformations: Secondary | ICD-10-CM

## 2023-01-09 DIAGNOSIS — O09299 Supervision of pregnancy with other poor reproductive or obstetric history, unspecified trimester: Secondary | ICD-10-CM

## 2023-01-09 DIAGNOSIS — Z3A20 20 weeks gestation of pregnancy: Secondary | ICD-10-CM | POA: Diagnosis not present

## 2023-01-09 DIAGNOSIS — Z1379 Encounter for other screening for genetic and chromosomal anomalies: Secondary | ICD-10-CM

## 2023-01-09 DIAGNOSIS — O10912 Unspecified pre-existing hypertension complicating pregnancy, second trimester: Secondary | ICD-10-CM

## 2023-01-09 DIAGNOSIS — O09292 Supervision of pregnancy with other poor reproductive or obstetric history, second trimester: Secondary | ICD-10-CM

## 2023-01-09 DIAGNOSIS — O10919 Unspecified pre-existing hypertension complicating pregnancy, unspecified trimester: Secondary | ICD-10-CM

## 2023-01-09 NOTE — Progress Notes (Signed)
[place] 20 wks,cephalic,posterior placenta gr 0,cx 5.5 cm,SVP of fluid 6.4 cm,FHR 148 bpm,EFW 341 g 59%,anatomy complete,limited view of heart,please have pt come back for additional images,no obvious abnormalities

## 2023-01-09 NOTE — Progress Notes (Unsigned)
HIGH-RISK PREGNANCY VISIT Patient name: Kristina Scott MRN [id number]  Date of birth: 1991/06/19 Chief Complaint:   High Risk Gestation ([place] today; 2nd IT today)  History of Present Illness:   Kristina Scott is a 32 y.o. G42P2002 female at [redacted]w[redacted]d with an Estimated Date of Delivery: 05/29/23 being seen today for ongoing management of a high-risk pregnancy complicated by {High Risk OB:23190}.    Today she reports {pregnancy symptoms:25616::"no complaints"}. Contractions: Not present. Vag. Bleeding: None.  Movement: Present. {Actions; denies-reports:120008} leaking of fluid.      11/24/2022   11:17 AM 10/16/2022    1:57 PM  Depression screen PHQ 2/9  Decreased Interest 1 0  Down, Depressed, Hopeless 0 0  PHQ - 2 Score 1 0  Altered sleeping 0 0  Tired, decreased energy 2 2  Change in appetite 1 1  Feeling bad or failure about yourself  0 0  Trouble concentrating 0 0  Moving slowly or fidgety/restless 0 0  Suicidal thoughts 0 0  PHQ-9 Score 4 3        11/24/2022   11:17 AM 10/16/2022    1:57 PM  GAD 7 : Generalized Anxiety Score  Nervous, Anxious, on Edge 0 0  Control/stop worrying 0 0  Worry too much - different things 0 0  Trouble relaxing 0 0  Restless 0 0  Easily annoyed or irritable 2 0  Afraid - awful might happen 0 0  Total GAD 7 Score 2 0     Review of Systems:   Pertinent items are noted in HPI Denies abnormal vaginal discharge w/ itching/odor/irritation, headaches, visual changes, shortness of breath, chest pain, abdominal pain, severe nausea/vomiting, or problems with urination or bowel movements unless otherwise stated above. Pertinent History Reviewed:  Reviewed past medical,surgical, social, obstetrical and family history.  Reviewed problem list, medications and allergies. Physical Assessment:   Vitals:   01/09/23 1138 01/09/23 1140  BP: (!) 152/98 (!) 142/88  Pulse: 90 80  Weight: (!) 396 lb 6.4 oz (179.8 kg)   Body mass index is 68.04 kg/m.            Physical Examination:   General appearance: {:315021}  Mental status: {:313008}  Skin: warm & dry   Extremities:      Cardiovascular: normal heart rate noted  Respiratory: normal respiratory effort, no distress  Abdomen: gravid, soft, non-tender  Pelvic: {Blank single:19197::"Cervical exam performed","Cervical exam deferred"}         Fetal Status:     Movement: Present    Fetal Surveillance Testing today: ***   Chaperone: {Chaperone:19197::"N/A","pt declined","Latisha Cresenzo","Janet Young","Amanda Andrews","Peggy Dones","Angel Neas"}    No results found for this or any previous visit (from the past 24 hour(s)).  Assessment & Plan:  High-risk pregnancy: G3P2002 at [redacted]w[redacted]d with an Estimated Date of Delivery: 05/29/23      ICD-10-CM   1. Supervision of high risk pregnancy in second trimester  O09.92     2. Encounter for genetic screening  Z13.79 INTEGRATED 2    3. Chronic hypertension during pregnancy, antepartum: Nifedipine 90 qd + labetalol 300 TID  O10.919     4. History of pre-eclampsia in prior pregnancy, currently pregnant  O09.299        1) ***, {stable/unstable:60080}  2) ***, {stable/unstable:60080}  Meds: No orders of the defined types were placed in this encounter.   Orders:  Orders Placed This Encounter  Procedures   INTEGRATED 2     Labs/procedures today: {ob lab/procedures:25214}  Treatment Plan:  ***  Reviewed: {Blank single:19197::"Term","Preterm"} labor symptoms and general obstetric precautions including but not limited to vaginal bleeding, contractions, leaking of fluid and fetal movement were reviewed in detail with the patient.  All questions were answered. {does does not:25387::"Does"} have home bp cuff. Office bp cuff given: {yes/no/default n/a:21102::"not applicable"}. Check bp {weekly daily:25388::"weekly"}, let us know if consistently {pregnant bp:25389::">140 and/or >90"}.  Follow-up: Return in about 4 weeks (around 02/06/2023) for OB  sonogram for growth, HROB.   Future Appointments  Date Time Provider Department Center  01/23/2023  4:20 PM Meriam Sprague, MD [facility] None    Orders Placed This Encounter  Procedures   INTEGRATED 2   Lazaro Arms  Attending Physician for the Center for [facility] 01/09/2023 12:00 PM

## 2023-01-12 LAB — COMPREHENSIVE METABOLIC PANEL
ALT: 35 IU/L — ABNORMAL HIGH (ref 0–32)
AST: 24 IU/L (ref 0–40)
Albumin/Globulin Ratio: 1.1 — ABNORMAL LOW (ref 1.2–2.2)
Alkaline Phosphatase: 85 IU/L (ref 44–121)
BUN/Creatinine Ratio: 11 (ref 9–23)
BUN: 7 mg/dL (ref 6–20)
Bilirubin Total: <0.2 mg/dL (ref 0.0–1.2)
CO2: 19 mmol/L — ABNORMAL LOW (ref 20–29)
Calcium: 10.1 mg/dL (ref 8.7–10.2)
Chloride: 100 mmol/L (ref 96–106)
Creatinine, Ser: 0.65 mg/dL (ref 0.57–1.00)
Potassium: 4.2 mmol/L (ref 3.5–5.2)
Sodium: 135 mmol/L (ref 134–144)
Total Protein: 7.8 g/dL (ref 6.0–8.5)
eGFR: 120 mL/min/{1.73_m2} (ref >59)

## 2023-01-12 LAB — INTEGRATED 1
Crown Rump Length: 72.6 mm
Gest. Age on Collection Date: 13.1 weeks
Maternal Age at EDD: 33.1 yr
Nuchal Translucency (NT): 1.6 mm
Number of Fetuses: 1
PAPP-A Value: 384.5 ng/mL
Weight: 396 [lb_av]

## 2023-01-12 LAB — CBC/D/PLT+RPR+RH+ABO+RUBIGG...
Antibody Screen: NEGATIVE
Basophils Absolute: 0 10*3/uL (ref 0.0–0.2)
Basos: 0 %
Eos: 1 %
HCV Ab: NONREACTIVE
Hematocrit: 33.8 % — ABNORMAL LOW (ref 34.0–46.6)
Hemoglobin: 11.1 g/dL (ref 11.1–15.9)
Hepatitis B Surface Ag: NEGATIVE
Immature Grans (Abs): 0 10*3/uL (ref 0.0–0.1)
Immature Granulocytes: 0 %
Lymphocytes Absolute: 2.3 10*3/uL (ref 0.7–3.1)
Lymphs: 26 %
MCH: 29.1 pg (ref 26.6–33.0)
MCHC: 32.8 g/dL (ref 31.5–35.7)
MCV: 89 fL (ref 79–97)
Monocytes Absolute: 0.5 10*3/uL (ref 0.1–0.9)
Monocytes: 6 %
Neutrophils Absolute: 5.9 10*3/uL (ref 1.4–7.0)
Platelets: 325 10*3/uL (ref 150–450)
RBC: 3.81 x10E6/uL (ref 3.77–5.28)
RDW: 14.5 % (ref 11.7–15.4)
Rh Factor: POSITIVE
Rubella Antibodies, IGG: 1.81 index (ref >0.99)
WBC: 8.9 10*3/uL (ref 3.4–10.8)

## 2023-01-12 LAB — PROTEIN / CREATININE RATIO, URINE
Protein, Ur: 25.2 mg/dL
Protein/Creat Ratio: 90 mg/g creat (ref 0–200)

## 2023-01-12 LAB — INTEGRATED 2
AFP MoM: 1.16
Alpha-Fetoprotein: 23.2 ng/mL
Crown Rump Length: 72.6 mm
DIA MoM: 1.52
DIA Value: 163.1 pg/mL
Estriol, Unconjugated: 1.46 ng/mL
Gest. Age on Collection Date: 13.1 weeks
Gestational Age: 19.7 weeks
Maternal Age at EDD: 33.1 yr
Nuchal Translucency (NT): 1.6 mm
Nuchal Translucency MoM: 0.96
Number of Fetuses: 1
PAPP-A MoM: 1.13
PAPP-A Value: 384.5 ng/mL
Test Results:: NEGATIVE
Weight: 396 [lb_av]
Weight: 396 [lb_av]
hCG MoM: 0.78
hCG Value: 6.2 IU/mL
uE3 MoM: 1.02

## 2023-01-12 LAB — HCV INTERPRETATION

## 2023-01-12 LAB — HEMOGLOBIN A1C
Est. average glucose Bld gHb Est-mCnc: 111 mg/dL
Hgb A1c MFr Bld: 5.5 % (ref 4.8–5.6)

## 2023-01-22 NOTE — Progress Notes (Unsigned)
Cardio-Obstetrics Clinic  New Evaluation  Date:  01/24/2023   ID:  Kristina Scott, DOB 03-14-1991, MRN [id number]  PCP:  Patient, No Pcp Per   [facility] Providers Cardiologist:  None  Electrophysiologist:  None       Referring MD: Cheral Marker, CNM   Chief Complaint: morbid obesity, HTN  History of Present Illness:    Kristina Scott is a 32 y.o. female [G3P2002] who is being seen today for the evaluation of morbid obesity and HTN at the request of Cheral Marker, CNM.   Patient seen by Shawna Clamp on 12/2022. Note reviewed. Patient with history of pre-eclampsia as well as chronic HTN and morbid obesity. She was maintained on nifedipine 90mg  daily and labetalol 300mg  BID.  Today, the patient is currently 22w pregnant. No chest pain, SOB, orthopnea, or PND. Has mild LE edema throughout pregnancy. Blood pressure has been running high 130-140s/70s. She is working on lifestyle modifications and may plan to join a gym. No significant exertional symptoms.  Has history of pre-eclampsia during prior pregnancy and was delivered at 37w.    Prior CV Studies Reviewed: The following studies were reviewed today: No CV studies  Past Medical History:  Diagnosis Date   Contraceptive management 12/28/2013   Encounter for Nexplanon removal 12/28/2013   High blood pressure affecting pregnancy in third trimester, delivered    Hypertension    Obesity    Pregnant state, incidental 08/23/2014    Past Surgical History:  Procedure Laterality Date   WISDOM TOOTH EXTRACTION        OB History     Gravida  3   Para  2   Term  2   Preterm      AB      Living  2      SAB      IAB      Ectopic      Multiple  0   Live Births  2               Current Medications: Current Meds  Medication Sig   acetaminophen (TYLENOL) 325 MG tablet Take 650 mg by mouth every 6 (six) hours as needed for headache.   aspirin EC 81 MG tablet Take 2 daily    labetalol (NORMODYNE) 200 MG tablet Take 2 tablets (400 mg total) by mouth 2 (two) times daily.   NIFEdipine (ADALAT CC) 90 MG 24 hr tablet Take 1 tablet (90 mg total) by mouth daily.   prenatal vitamin w/FE, FA (PRENATAL 1 + 1) 27-1 MG TABS tablet Take 1 tablet by mouth daily at 12 noon.   [DISCONTINUED] labetalol (NORMODYNE) 300 MG tablet Take 1 tablet (300 mg total) by mouth 2 (two) times daily.     Allergies:   Patient has no known allergies.   Social History   Socioeconomic History   Marital status: Married    Spouse name: Not on file   Number of children: Not on file   Years of education: Not on file   Highest education level: Not on file  Occupational History   Not on file  Tobacco Use   Smoking status: Former    Types: Cigarettes    Quit date: 03/07/2008    Years since quitting: 14.8   Smokeless tobacco: Never  Vaping Use   Vaping Use: Never used  Substance and Sexual Activity   Alcohol use: No    Alcohol/week: 0.0 standard drinks of alcohol  Drug use: No   Sexual activity: Yes    Birth control/protection: None  Other Topics Concern   Not on file  Social History Narrative   Not on file   Social Determinants of Health   Financial Resource Strain: Low Risk  (10/16/2022)   Overall Financial Resource Strain (CARDIA)    Difficulty of Paying Living Expenses: Not hard at all  Food Insecurity: No Food Insecurity (10/16/2022)   Hunger Vital Sign    Worried About Running Out of Food in the Last Year: Never true    Ran Out of Food in the Last Year: Never true  Transportation Needs: No Transportation Needs (10/16/2022)   PRAPARE - Administrator, Civil Service (Medical): No    Lack of Transportation (Non-Medical): No  Physical Activity: Insufficiently Active (10/16/2022)   Exercise Vital Sign    Days of Exercise per Week: 3 days    Minutes of Exercise per Session: 20 min  Stress: No Stress Concern Present (10/16/2022)   [employer] of Occupational Health  - Occupational Stress Questionnaire    Feeling of Stress : Not at all  Social Connections: Moderately Integrated (10/16/2022)   Social Connection and Isolation Panel [NHANES]    Frequency of Communication with Friends and Family: More than three times a week    Frequency of Social Gatherings with Friends and Family: Twice a week    Attends Religious Services: 1 to 4 times per year    Active Member of [employer] or Organizations: No    Attends Banker Meetings: Never    Marital Status: Married      Family History  Problem Relation Age of Onset   Stroke Maternal Grandfather       ROS:   Please see the history of present illness.    All other systems reviewed and are negative.   Labs/EKG Reviewed:    EKG:   EKG is  ordered today.  The ekg ordered today demonstrates NSR (personally reviewed on ECG machine)  Recent Labs: 11/24/2022: ALT 35; BUN 7; Creatinine, Ser 0.65; Hemoglobin 11.1; Platelets 325; Potassium 4.2; Sodium 135   Recent Lipid Panel No results found for: "CHOL", "TRIG", "HDL", "CHOLHDL", "LDLCALC", "LDLDIRECT"  Physical Exam:    VS:  BP (!) 147/85   Pulse 94   Ht 5\' 4"  (1.626 m)   Wt (!) 397 lb (180.1 kg)   LMP 08/22/2022 (Approximate)   SpO2 100%   BMI 68.14 kg/m     Wt Readings from Last 3 Encounters:  01/23/23 (!) 397 lb (180.1 kg)  01/09/23 (!) 396 lb 6.4 oz (179.8 kg)  12/19/22 (!) 389 lb 9.6 oz (176.7 kg)     GEN:  Well nourished, well developed in no acute distress HEENT: Normal NECK: No JVD; No carotid bruits CARDIAC: RRR, 2/6 systolic murmur RESPIRATORY:  Clear to auscultation without rales, wheezing or rhonchi  ABDOMEN: Obese, soft MUSCULOSKELETAL:  Trace ankle edema  SKIN: Warm and dry NEUROLOGIC:  Alert and oriented x 3 PSYCHIATRIC:  Normal affect    Risk Assessment/Risk Calculators:     ASSESSMENT & PLAN:    #HTN: #History of Pre-eclampsia: -Has chronic HTN now affecting pregnancy -Currently, BP mainly 140s at  home -Continue nifedipine 90mg  daily -Increase labetalol to 400mg  BID -Continue ASA 81mg  daily -High risk for recurrent pre-eclampsia; patient will watch BP closely at home  #Systolic Murmur: -Patient states she was told she had a murmur when she was young and has a 2/6 murmur  on exam today -Possibly flow murmur in the setting of pregnancy but given that she has had a murmur for years that preceded pregnancy, will check TTE for further evaluation  #Morbid Obesity: -Wt 396lbs -Discussed importance of minimal weight gain during pregnancy as well as trying to maintain a healthy lifestyle with diet and exercise as detailed below -If has insurance coverage, would benefit from GLP-1 receptor agonist once no longer pregnant or breast feeding  Exercise recommendations: Goal of exercising for at least 30 minutes a day, at least 5 times per week.  Please exercise to a moderate exertion.  This means that while exercising it is difficult to speak in full sentences, however you are not so short of breath that you feel you must stop, and not so comfortable that you can carry on a full conversation.  Exertion level should be approximately a 5/10, if 10 is the most exertion you can perform.  Diet recommendations: Recommend a heart healthy diet such as the Mediterranean diet.  This diet consists of plant based foods, healthy fats, lean meats, olive oil.  It suggests limiting the intake of simple carbohydrates such as white breads, pastries, and pastas.  It also limits the amount of red meat, wine, and dairy products such as cheese that one should consume on a daily basis.   Patient Instructions  Medication Instructions:  Your physician has recommended you make the following change in your medication:   Increase labetalol to 400 mg 2 times daily  *If you need a refill on your cardiac medications before your next appointment, please call your pharmacy*   Testing/Procedures: Your physician has requested  that you have an echocardiogram. Echocardiography is a painless test that uses sound waves to create images of your heart. It provides your doctor with information about the size and shape of your heart and how well your heart's chambers and valves are working. This procedure takes approximately one hour. There are no restrictions for this procedure. Please do NOT wear cologne, perfume, aftershave, or lotions (deodorant is allowed). Please arrive 15 minutes prior to your appointment time.    Follow-Up: At [facility], you and your health needs are our priority.  As part of our continuing mission to provide you with exceptional heart care, we have created designated Provider Care Teams.  These Care Teams include your primary Cardiologist (physician) and Advanced Practice Providers (APPs -  Physician Assistants and Nurse Practitioners) who all work together to provide you with the care you need, when you need it.     Your next appointment:   8 week(s)  Provider:   Dr. Servando Salina      Dispo:  No follow-ups on file.   Medication Adjustments/Labs and Tests Ordered: Current medicines are reviewed at length with the patient today.  Concerns regarding medicines are outlined above.  Tests Ordered: Orders Placed This Encounter  Procedures   EKG 12-Lead   ECHOCARDIOGRAM COMPLETE   Medication Changes: Meds ordered this encounter  Medications   labetalol (NORMODYNE) 200 MG tablet    Sig: Take 2 tablets (400 mg total) by mouth 2 (two) times daily.    Dispense:  360 tablet    Refill:  3

## 2023-01-23 ENCOUNTER — Encounter: Payer: Self-pay | Admitting: *Deleted

## 2023-01-23 ENCOUNTER — Encounter: Payer: Self-pay | Admitting: Cardiology

## 2023-01-23 ENCOUNTER — Ambulatory Visit (INDEPENDENT_AMBULATORY_CARE_PROVIDER_SITE_OTHER): Payer: Medicaid Other | Admitting: Cardiology

## 2023-01-23 VITALS — BP 147/85 | HR 94 | Ht 64.0 in | Wt 397.0 lb

## 2023-01-23 DIAGNOSIS — I10 Essential (primary) hypertension: Secondary | ICD-10-CM | POA: Diagnosis not present

## 2023-01-23 DIAGNOSIS — O09299 Supervision of pregnancy with other poor reproductive or obstetric history, unspecified trimester: Secondary | ICD-10-CM

## 2023-01-23 DIAGNOSIS — R011 Cardiac murmur, unspecified: Secondary | ICD-10-CM

## 2023-01-23 DIAGNOSIS — Z3A22 22 weeks gestation of pregnancy: Secondary | ICD-10-CM

## 2023-01-23 MED ORDER — LABETALOL HCL 200 MG PO TABS: 400.0000 mg | ORAL_TABLET | Freq: Two times a day (BID) | ORAL | 3 refills | Status: DC

## 2023-01-23 NOTE — Patient Instructions (Signed)
Medication Instructions:  Your physician has recommended you make the following change in your medication:   Increase labetalol to 400 mg 2 times daily  *If you need a refill on your cardiac medications before your next appointment, please call your pharmacy*   Testing/Procedures: Your physician has requested that you have an echocardiogram. Echocardiography is a painless test that uses sound waves to create images of your heart. It provides your doctor with information about the size and shape of your heart and how well your heart's chambers and valves are working. This procedure takes approximately one hour. There are no restrictions for this procedure. Please do NOT wear cologne, perfume, aftershave, or lotions (deodorant is allowed). Please arrive 15 minutes prior to your appointment time.    Follow-Up: At [facility], you and your health needs are our priority.  As part of our continuing mission to provide you with exceptional heart care, we have created designated Provider Care Teams.  These Care Teams include your primary Cardiologist (physician) and Advanced Practice Providers (APPs -  Physician Assistants and Nurse Practitioners) who all work together to provide you with the care you need, when you need it.     Your next appointment:   8 week(s)  Provider:   Dr. Servando Salina

## 2023-02-05 ENCOUNTER — Other Ambulatory Visit: Payer: Self-pay | Admitting: Obstetrics & Gynecology

## 2023-02-05 DIAGNOSIS — Z363 Encounter for antenatal screening for malformations: Secondary | ICD-10-CM

## 2023-02-05 DIAGNOSIS — O10919 Unspecified pre-existing hypertension complicating pregnancy, unspecified trimester: Secondary | ICD-10-CM

## 2023-02-06 ENCOUNTER — Ambulatory Visit (INDEPENDENT_AMBULATORY_CARE_PROVIDER_SITE_OTHER): Payer: Medicaid Other

## 2023-02-06 ENCOUNTER — Ambulatory Visit (INDEPENDENT_AMBULATORY_CARE_PROVIDER_SITE_OTHER): Payer: Medicaid Other | Admitting: Obstetrics & Gynecology

## 2023-02-06 VITALS — BP 141/87 | HR 80 | Wt 396.2 lb

## 2023-02-06 DIAGNOSIS — O10912 Unspecified pre-existing hypertension complicating pregnancy, second trimester: Secondary | ICD-10-CM

## 2023-02-06 DIAGNOSIS — O10919 Unspecified pre-existing hypertension complicating pregnancy, unspecified trimester: Secondary | ICD-10-CM

## 2023-02-06 DIAGNOSIS — Z3A24 24 weeks gestation of pregnancy: Secondary | ICD-10-CM | POA: Diagnosis not present

## 2023-02-06 DIAGNOSIS — O09292 Supervision of pregnancy with other poor reproductive or obstetric history, second trimester: Secondary | ICD-10-CM

## 2023-02-06 DIAGNOSIS — O0992 Supervision of high risk pregnancy, unspecified, second trimester: Secondary | ICD-10-CM

## 2023-02-06 DIAGNOSIS — Z363 Encounter for antenatal screening for malformations: Secondary | ICD-10-CM

## 2023-02-06 DIAGNOSIS — O09299 Supervision of pregnancy with other poor reproductive or obstetric history, unspecified trimester: Secondary | ICD-10-CM

## 2023-02-06 MED ORDER — LABETALOL HCL 200 MG PO TABS: 400.0000 mg | ORAL_TABLET | Freq: Three times a day (TID) | ORAL | 3 refills | Status: DC

## 2023-02-06 NOTE — Progress Notes (Signed)
HIGH-RISK PREGNANCY VISIT Patient name: Kristina Scott MRN [id number]  Date of birth: 1991-02-19 Chief Complaint:   Routine Prenatal Visit  History of Present Illness:   Kristina Scott is a 32 y.o. G28P2002 female at [redacted]w[redacted]d with an Estimated Date of Delivery: 05/29/23 being seen today for ongoing management of a high-risk pregnancy complicated by cHTN on procardiz XL 90 + labetalol 400 mg BID^TID today.    Today she reports no complaints. Contractions: Not present. Vag. Bleeding: None.  Movement: Present. denies leaking of fluid.      11/24/2022   11:17 AM 10/16/2022    1:57 PM  Depression screen PHQ 2/9  Decreased Interest 1 0  Down, Depressed, Hopeless 0 0  PHQ - 2 Score 1 0  Altered sleeping 0 0  Tired, decreased energy 2 2  Change in appetite 1 1  Feeling bad or failure about yourself  0 0  Trouble concentrating 0 0  Moving slowly or fidgety/restless 0 0  Suicidal thoughts 0 0  PHQ-9 Score 4 3        11/24/2022   11:17 AM 10/16/2022    1:57 PM  GAD 7 : Generalized Anxiety Score  Nervous, Anxious, on Edge 0 0  Control/stop worrying 0 0  Worry too much - different things 0 0  Trouble relaxing 0 0  Restless 0 0  Easily annoyed or irritable 2 0  Afraid - awful might happen 0 0  Total GAD 7 Score 2 0     Review of Systems:   Pertinent items are noted in HPI Denies abnormal vaginal discharge w/ itching/odor/irritation, headaches, visual changes, shortness of breath, chest pain, abdominal pain, severe nausea/vomiting, or problems with urination or bowel movements unless otherwise stated above. Pertinent History Reviewed:  Reviewed past medical,surgical, social, obstetrical and family history.  Reviewed problem list, medications and allergies. Physical Assessment:   Vitals:   02/06/23 1044 02/06/23 1106  BP: (!) 149/98 (!) 141/87  Pulse: 85 80  Weight: (!) 396 lb 3.2 oz (179.7 kg)   Body mass index is 68.01 kg/m.           Physical Examination:   General  appearance: alert, well appearing, and in no distress  Mental status: alert, oriented to person, place, and time  Skin: warm & dry   Extremities: Edema: Trace    Cardiovascular: normal heart rate noted  Respiratory: normal respiratory effort, no distress  Abdomen: gravid, soft, non-tender  Pelvic: Cervical exam deferred         Fetal Status:     Movement: Present    Fetal Surveillance Testing today: sonogram is normal today   Chaperone: N/A    No results found for this or any previous visit (from the past 24 hour(s)).  Assessment & Plan:  High-risk pregnancy: G3P2002 at [redacted]w[redacted]d with an Estimated Date of Delivery: 05/29/23      ICD-10-CM   1. Supervision of high risk pregnancy in second trimester  O09.92     2. Chronic hypertension during pregnancy, antepartum: Nifedipine 90 qd + labetalol 400 TID  O10.919     3. History of pre-eclampsia in prior pregnancy, currently pregnant, ASA 162  O09.299        Meds:  Meds ordered this encounter  Medications   labetalol (NORMODYNE) 200 MG tablet    Sig: Take 2 tablets (400 mg total) by mouth 3 (three) times daily.    Dispense:  360 tablet    Refill:  3  Orders: No orders of the defined types were placed in this encounter.    Labs/procedures today: U/S  Treatment Plan:  keep appts  Reviewed: Preterm labor symptoms and general obstetric precautions including but not limited to vaginal bleeding, contractions, leaking of fluid and fetal movement were reviewed in detail with the patient.  All questions were answered. Does have home bp cuff. Office bp cuff given: yes. Check bp twice daily, let us know if consistently >150 and/or >95.  Follow-up: No follow-ups on file.   Future Appointments  Date Time Provider Department Center  02/24/2023  1:05 PM [facility] ECHO 5 [id number]  03/06/2023  9:15 AM [facility] [place] [facility] None  03/06/2023 10:10 AM Lazaro Arms, MD [facility]  04/03/2023 10:00 AM [facility] [place]  [facility] None  04/03/2023 11:10 AM Myna Hidalgo, DO [facility]  04/03/2023  2:00 PM Tobb, Kardie, DO [facility] None  04/07/2023  9:50 AM [facility] NURSE [facility]  04/10/2023 10:00 AM [facility] [place] [facility] None  04/10/2023 10:50 AM Lazaro Arms, MD [facility]  04/14/2023  9:30 AM [facility] NURSE [facility]  04/17/2023 10:00 AM [facility] [place] [facility] None  04/17/2023 10:50 AM Lazaro Arms, MD [facility]  04/21/2023  9:30 AM [facility] NURSE [facility]  04/24/2023 10:00 AM [facility] [place] [facility] None  04/28/2023  9:30 AM [facility] NURSE [facility]  05/01/2023 10:00 AM [facility] [place] [facility] None  05/05/2023  9:30 AM [facility] NURSE [facility]  05/08/2023 10:00 AM [facility] [place] [facility] None  05/12/2023  9:30 AM [facility] NURSE [facility]  05/15/2023 10:00 AM [facility] [place] [facility] None  05/19/2023  9:30 AM [facility] NURSE [facility]  05/22/2023 10:00 AM [facility] [place] [facility] None  05/26/2023  9:30 AM [facility] NURSE [facility]    No orders of the defined types were placed in this encounter.  Lazaro Arms  Attending Physician for the Center for [facility] 02/06/2023 11:14 AM

## 2023-02-06 NOTE — Progress Notes (Signed)
[place] 24 wks,breech,cx 4.4 cm,SVP of fluid 6.8 cm,post pl gr 2,normal ovaries,FHR 154 BPM,EFW 760 g 84%,anatomy of the heart complete,no obvious abnormalities

## 2023-02-24 ENCOUNTER — Ambulatory Visit (HOSPITAL_COMMUNITY): Payer: Medicaid Other | Attending: Cardiology

## 2023-02-24 DIAGNOSIS — O09292 Supervision of pregnancy with other poor reproductive or obstetric history, second trimester: Secondary | ICD-10-CM

## 2023-02-24 DIAGNOSIS — Z3A26 26 weeks gestation of pregnancy: Secondary | ICD-10-CM

## 2023-02-24 DIAGNOSIS — O09299 Supervision of pregnancy with other poor reproductive or obstetric history, unspecified trimester: Secondary | ICD-10-CM | POA: Diagnosis present

## 2023-02-24 DIAGNOSIS — I10 Essential (primary) hypertension: Secondary | ICD-10-CM | POA: Diagnosis not present

## 2023-02-24 LAB — ECHOCARDIOGRAM COMPLETE
AR max vel: 2.72 cm2
AV Area VTI: 2.86 cm2
AV Area mean vel: 2.71 cm2
AV Mean grad: 8 mmHg
AV Peak grad: 15.5 mmHg
Ao pk vel: 1.97 m/s
Area-P 1/2: 3.76 cm2
S' Lateral: 3.1 cm

## 2023-03-05 ENCOUNTER — Other Ambulatory Visit: Payer: Self-pay | Admitting: Obstetrics & Gynecology

## 2023-03-05 ENCOUNTER — Encounter: Payer: Self-pay | Admitting: Obstetrics & Gynecology

## 2023-03-05 DIAGNOSIS — O10919 Unspecified pre-existing hypertension complicating pregnancy, unspecified trimester: Secondary | ICD-10-CM

## 2023-03-06 ENCOUNTER — Encounter: Payer: Self-pay | Admitting: Obstetrics & Gynecology

## 2023-03-06 ENCOUNTER — Ambulatory Visit (INDEPENDENT_AMBULATORY_CARE_PROVIDER_SITE_OTHER): Payer: Medicaid Other

## 2023-03-06 ENCOUNTER — Ambulatory Visit (INDEPENDENT_AMBULATORY_CARE_PROVIDER_SITE_OTHER): Payer: Medicaid Other | Admitting: Obstetrics & Gynecology

## 2023-03-06 VITALS — BP 149/93 | HR 80 | Wt 398.0 lb

## 2023-03-06 DIAGNOSIS — O0993 Supervision of high risk pregnancy, unspecified, third trimester: Secondary | ICD-10-CM

## 2023-03-06 DIAGNOSIS — O10913 Unspecified pre-existing hypertension complicating pregnancy, third trimester: Secondary | ICD-10-CM | POA: Diagnosis not present

## 2023-03-06 DIAGNOSIS — O10919 Unspecified pre-existing hypertension complicating pregnancy, unspecified trimester: Secondary | ICD-10-CM

## 2023-03-06 DIAGNOSIS — Z3A28 28 weeks gestation of pregnancy: Secondary | ICD-10-CM

## 2023-03-06 DIAGNOSIS — O0992 Supervision of high risk pregnancy, unspecified, second trimester: Secondary | ICD-10-CM

## 2023-03-06 MED ORDER — SOLIFENACIN SUCCINATE 10 MG PO TABS: 10.0000 mg | ORAL_TABLET | Freq: Every day | ORAL | 11 refills | Status: DC

## 2023-03-06 MED ORDER — GABAPENTIN 300 MG PO CAPS: 300.0000 mg | ORAL_CAPSULE | Freq: Three times a day (TID) | ORAL | 3 refills | Status: DC

## 2023-03-06 MED ORDER — LABETALOL HCL 200 MG PO TABS: 600.0000 mg | ORAL_TABLET | Freq: Three times a day (TID) | ORAL | 3 refills | Status: DC

## 2023-03-06 NOTE — Progress Notes (Signed)
HIGH-RISK PREGNANCY VISIT Patient name: Kristina Scott MRN [id number]  Date of birth: Jul 21, 1991 Chief Complaint:   High Risk Gestation ([place] today)  History of Present Illness:   Kristina Scott is a 32 y.o. G57P2002 female at [redacted]w[redacted]d with an Estimated Date of Delivery: 05/29/23 being seen today for ongoing management of a high-risk pregnancy complicated by cHTN severe on procardia 90 every day + labetalol 600 TID now.    Today she reports no complaints. Contractions: Irritability. Vag. Bleeding: None.  Movement: Present. denies leaking of fluid.      11/24/2022   11:17 AM 10/16/2022    1:57 PM  Depression screen PHQ 2/9  Decreased Interest 1 0  Down, Depressed, Hopeless 0 0  PHQ - 2 Score 1 0  Altered sleeping 0 0  Tired, decreased energy 2 2  Change in appetite 1 1  Feeling bad or failure about yourself  0 0  Trouble concentrating 0 0  Moving slowly or fidgety/restless 0 0  Suicidal thoughts 0 0  PHQ-9 Score 4 3        11/24/2022   11:17 AM 10/16/2022    1:57 PM  GAD 7 : Generalized Anxiety Score  Nervous, Anxious, on Edge 0 0  Control/stop worrying 0 0  Worry too much - different things 0 0  Trouble relaxing 0 0  Restless 0 0  Easily annoyed or irritable 2 0  Afraid - awful might happen 0 0  Total GAD 7 Score 2 0     Review of Systems:   Pertinent items are noted in HPI Denies abnormal vaginal discharge w/ itching/odor/irritation, headaches, visual changes, shortness of breath, chest pain, abdominal pain, severe nausea/vomiting, or problems with urination or bowel movements unless otherwise stated above. Pertinent History Reviewed:  Reviewed past medical,surgical, social, obstetrical and family history.  Reviewed problem list, medications and allergies. Physical Assessment:   Vitals:   03/06/23 1046  BP: (!) 149/93  Pulse: 80  Weight: (!) 398 lb (180.5 kg)  Body mass index is 68.32 kg/m.           Physical Examination:   General appearance: alert, well  appearing, and in no distress  Mental status: alert, oriented to person, place, and time  Skin: warm & dry   Extremities: Edema: Mild pitting, slight indentation    Cardiovascular: normal heart rate noted  Respiratory: normal respiratory effort, no distress  Abdomen: gravid, soft, non-tender  Pelvic: Cervical exam deferred         Fetal Status:     Movement: Present    Fetal Surveillance Testing today: BPP 8/8 EFW 56%   Chaperone: N/A    No results found for this or any previous visit (from the past 24 hour(s)).  Assessment & Plan:  High-risk pregnancy: G3P2002 at [redacted]w[redacted]d with an Estimated Date of Delivery: 05/29/23      ICD-10-CM   1. Supervision of high risk pregnancy in third trimester  O09.93     2. Chronic hypertension during pregnancy, antepartum: Nifedipine 90 qd + labetalol 600 TID  O10.919        Meds:  Meds ordered this encounter  Medications   labetalol (NORMODYNE) 200 MG tablet    Sig: Take 3 tablets (600 mg total) by mouth 3 (three) times daily.    Dispense:  360 tablet    Refill:  3    Orders: No orders of the defined types were placed in this encounter.    Labs/procedures today: U/S  Treatment Plan:  Garnette Scheuermann 600 TID    Follow-up: Return in about 2 weeks (around 03/20/2023) for HROB with MD.   Future Appointments  Date Time Provider Department Center  04/03/2023 10:00 AM [facility] [place] [facility] None  04/03/2023 11:10 AM Myna Hidalgo, DO [facility]TO[facility]YN  04/03/2023  2:00 PM Tobb, Kardie, DO [facility] None  04/07/2023  9:50 AM [facility] NURSE [facility]  04/10/2023 10:00 AM [facility] - [facility] [place] [facility] None  04/10/2023 10:50 AM Lazaro Arms, MD [facility]TO[facility]YN  04/14/2023  9:30 AM [facility] NURSE [facility]  04/17/2023 10:00 AM [facility] - [facility] [place] [facility] None  04/17/2023 10:50 AM Lazaro Arms, MD [facility]  04/21/2023  9:30 AM [facility] NURSE [facility]  04/24/2023 10:00 AM [facility] - [facility] [place] [facility] None  04/24/2023 10:50 AM Myna Hidalgo, DO [facility]  04/28/2023  9:30 AM [facility] NURSE [facility]  05/01/2023 10:00 AM [facility] - [facility] [place] [facility] None  05/01/2023 10:50 AM Myna Hidalgo, DO [facility]  05/05/2023  9:30 AM [facility] NURSE [facility]  05/08/2023 10:00 AM [facility] - [facility] [place] [facility] None  05/08/2023 10:50 AM Lazaro Arms, MD [facility]  05/12/2023  9:30 AM [facility] NURSE [facility]  05/15/2023 10:00 AM [facility] - [facility] [place] [facility] None  05/19/2023  9:30 AM [facility] NURSE [facility]  05/22/2023 10:00 AM [facility] - [facility] [place] [facility] None  05/26/2023  9:30 AM [facility] NURSE [facility]    No orders of the defined types were placed in this encounter.  Lazaro Arms  Attending Physician for the Center for [facility] 03/06/2023 11:23 AM

## 2023-03-06 NOTE — Progress Notes (Signed)
[place] 28 wks,cephalic,posterior fundal placenta gr 0,AFI 18 cm,FHR 148 bpm,EFW 1234 g 56%

## 2023-03-20 ENCOUNTER — Encounter: Payer: Self-pay | Admitting: Obstetrics & Gynecology

## 2023-03-20 ENCOUNTER — Ambulatory Visit: Payer: Medicaid Other | Admitting: Obstetrics & Gynecology

## 2023-03-20 ENCOUNTER — Other Ambulatory Visit: Payer: Medicaid Other

## 2023-03-20 VITALS — BP 122/76 | HR 89 | Wt 396.8 lb

## 2023-03-20 DIAGNOSIS — O10919 Unspecified pre-existing hypertension complicating pregnancy, unspecified trimester: Secondary | ICD-10-CM

## 2023-03-20 DIAGNOSIS — O0993 Supervision of high risk pregnancy, unspecified, third trimester: Secondary | ICD-10-CM

## 2023-03-20 DIAGNOSIS — Z3A30 30 weeks gestation of pregnancy: Secondary | ICD-10-CM

## 2023-03-20 DIAGNOSIS — O09293 Supervision of pregnancy with other poor reproductive or obstetric history, third trimester: Secondary | ICD-10-CM

## 2023-03-20 DIAGNOSIS — O10913 Unspecified pre-existing hypertension complicating pregnancy, third trimester: Secondary | ICD-10-CM

## 2023-03-20 DIAGNOSIS — O09299 Supervision of pregnancy with other poor reproductive or obstetric history, unspecified trimester: Secondary | ICD-10-CM

## 2023-03-20 DIAGNOSIS — Z131 Encounter for screening for diabetes mellitus: Secondary | ICD-10-CM

## 2023-03-20 DIAGNOSIS — Z3A29 29 weeks gestation of pregnancy: Secondary | ICD-10-CM

## 2023-03-20 NOTE — Progress Notes (Signed)
HIGH-RISK PREGNANCY VISIT Patient name: Kristina Scott MRN [id number]  Date of birth: 20-Mar-1991 Chief Complaint:   Routine Prenatal Visit (PN2 [place] information given)  History of Present Illness:   Kristina Scott is a 32 y.o. G69P2002 female at [redacted]w[redacted]d with an Estimated Date of Delivery: 05/29/23 being seen today for ongoing management of a high-risk pregnancy complicated by cHTN on Procardia XL 90 and labetalol 600 TID now with good control.    Today she reports no complaints. Contractions: Not present.  .  Movement: Present. denies leaking of fluid.      03/20/2023    9:08 AM 11/24/2022   11:17 AM 10/16/2022    1:57 PM  Depression screen PHQ 2/9  Decreased Interest 1 1 0  Down, Depressed, Hopeless 0 0 0  PHQ - 2 Score 1 1 0  Altered sleeping 0 0 0  Tired, decreased energy 2 2 2   Change in appetite 0 1 1  Feeling bad or failure about yourself  0 0 0  Trouble concentrating 0 0 0  Moving slowly or fidgety/restless 0 0 0  Suicidal thoughts 0 0 0  PHQ-9 Score 3 4 3         11/24/2022   11:17 AM 10/16/2022    1:57 PM  GAD 7 : Generalized Anxiety Score  Nervous, Anxious, on Edge 0 0  Control/stop worrying 0 0  Worry too much - different things 0 0  Trouble relaxing 0 0  Restless 0 0  Easily annoyed or irritable 2 0  Afraid - awful might happen 0 0  Total GAD 7 Score 2 0     Review of Systems:   Pertinent items are noted in HPI Denies abnormal vaginal discharge w/ itching/odor/irritation, headaches, visual changes, shortness of breath, chest pain, abdominal pain, severe nausea/vomiting, or problems with urination or bowel movements unless otherwise stated above. Pertinent History Reviewed:  Reviewed past medical,surgical, social, obstetrical and family history.  Reviewed problem list, medications and allergies. Physical Assessment:   Vitals:   03/20/23 0902  BP: 122/76  Pulse: 89  Weight: (!) 396 lb 12.8 oz (180 kg)  Body mass index is 68.11 kg/m.            Physical Examination:   General appearance: alert, well appearing, and in no distress  Mental status: alert, oriented to person, place, and time  Skin: warm & dry   Extremities: Edema: Trace    Cardiovascular: normal heart rate noted  Respiratory: normal respiratory effort, no distress  Abdomen: gravid, soft, non-tender  Pelvic: Cervical exam deferred         Fetal Status:     Movement: Present    Fetal Surveillance Testing today: FHR 150   Chaperone: N/A    No results found for this or any previous visit (from the past 24 hour(s)).  Assessment & Plan:  High-risk pregnancy: G3P2002 at [redacted]w[redacted]d with an Estimated Date of Delivery: 05/29/23      ICD-10-CM   1. Supervision of high risk pregnancy in third trimester  O09.93     2. Chronic hypertension during pregnancy, antepartum: Nifedipine 90 qd + labetalol 600 TID  O10.919     3. History of pre-eclampsia in prior pregnancy, currently pregnant, ASA 162  O09.299        Meds: No orders of the defined types were placed in this encounter.   Orders: No orders of the defined types were placed in this encounter.    Labs/procedures today: PN2  Treatment Plan:  twice weekly visits/surveillance to begin in 2 weeks  Reviewed: Preterm labor symptoms and general obstetric precautions including but not limited to vaginal bleeding, contractions, leaking of fluid and fetal movement were reviewed in detail with the patient.  All questions were answered. Does have home bp cuff. Office bp cuff given: not applicable. Check bp twice daily, let us know if consistently >150 and/or >95.  Follow-up: No follow-ups on file.   Future Appointments  Date Time Provider Department Center  04/03/2023 10:00 AM [facility] - [facility] [place] [facility] None  04/03/2023 11:10 AM Myna Hidalgo, DO [facility]  04/03/2023  2:00 PM Tobb, Kardie, DO [facility] None  04/07/2023  9:50 AM [facility] NURSE [facility]  04/10/2023 10:00 AM [facility] - [facility] [place] [facility] None  04/10/2023  10:50 AM Lazaro Arms, MD [facility] F[facility]YN  04/14/2023  9:30 AM [facility] NURSE [facility]  04/17/2023 10:00 AM [facility] [place] [facility] None  04/17/2023 10:50 AM Lazaro Arms, MD [facility]  04/21/2023  9:30 AM [facility] NUR[facility]  04/24/2023 10:00 AM [facility] [place] [facility] None  04/24/2023 10:50 AM Myna Hidalgo, DO [facility]  04/28/2023  9:30 AM [facility] N[facility]  05/01/2023 10:00 AM [facility] [place] [facility] None  05/01/2023 10:50 AM Myna Hidalgo, DO [facility]  05/05/2023  9:30 AM [facility] NURSE [facility]  05/08/2023 10:00 AM [facility] [place] [facility] None  05/08/2023 10:50 AM Lazaro Arms, MD [facility]  05/12/2023  9:30 AM [facility] NURSE [facility]  05/12/2023  9:50 AM Lazaro Arms, MD [facility]  05/15/2023 10:00 AM [facility] [place] [facility] None  05/19/2023  9:30 AM [facility] NURSE [facility]  05/22/2023 10:00 AM [facility] - [facility] [place] [facility] None  05/26/2023  9:30 AM [facility] NUR[facility]    No orders of the defined types were placed in this encounter.  Lazaro Arms  Attending Physician for the Center for [facility] 03/20/2023 9:37 PM

## 2023-03-21 LAB — CBC
Hematocrit: 31.6 % — ABNORMAL LOW (ref 34.0–46.6)
Hemoglobin: 10.3 g/dL — ABNORMAL LOW (ref 11.1–15.9)
MCH: 30 pg (ref 26.6–33.0)
MCHC: 32.6 g/dL (ref 31.5–35.7)
MCV: 92 fL (ref 79–97)
Platelets: 292 10*3/uL (ref 150–450)
RBC: 3.43 x10E6/uL — ABNORMAL LOW (ref 3.77–5.28)
RDW: 12.9 % (ref 11.7–15.4)
WBC: 9.2 10*3/uL (ref 3.4–10.8)

## 2023-03-21 LAB — RPR: RPR Ser Ql: NONREACTIVE

## 2023-03-21 LAB — ANTIBODY SCREEN: Antibody Screen: NEGATIVE

## 2023-03-21 LAB — GLUCOSE TOLERANCE, 2 HOURS W/ 1HR
Glucose, 1 hour: 92 mg/dL (ref 70–179)
Glucose, 2 hour: 57 mg/dL — ABNORMAL LOW (ref 70–152)
Glucose, Fasting: 74 mg/dL (ref 70–91)

## 2023-03-21 LAB — HIV ANTIBODY (ROUTINE TESTING W REFLEX): HIV Screen 4th Generation wRfx: NONREACTIVE

## 2023-04-02 ENCOUNTER — Other Ambulatory Visit: Payer: Self-pay | Admitting: Obstetrics & Gynecology

## 2023-04-02 DIAGNOSIS — O99213 Obesity complicating pregnancy, third trimester: Secondary | ICD-10-CM

## 2023-04-02 DIAGNOSIS — O10919 Unspecified pre-existing hypertension complicating pregnancy, unspecified trimester: Secondary | ICD-10-CM

## 2023-04-03 ENCOUNTER — Ambulatory Visit (INDEPENDENT_AMBULATORY_CARE_PROVIDER_SITE_OTHER): Payer: Medicaid Other | Admitting: Obstetrics & Gynecology

## 2023-04-03 ENCOUNTER — Institutional Professional Consult (permissible substitution): Payer: Medicaid Other | Admitting: Pharmacist

## 2023-04-03 ENCOUNTER — Encounter: Payer: Self-pay | Admitting: Cardiology

## 2023-04-03 ENCOUNTER — Ambulatory Visit (INDEPENDENT_AMBULATORY_CARE_PROVIDER_SITE_OTHER): Payer: Medicaid Other | Admitting: Cardiology

## 2023-04-03 ENCOUNTER — Encounter: Payer: Self-pay | Admitting: Obstetrics & Gynecology

## 2023-04-03 ENCOUNTER — Ambulatory Visit (INDEPENDENT_AMBULATORY_CARE_PROVIDER_SITE_OTHER): Payer: Medicaid Other

## 2023-04-03 VITALS — BP 143/92 | HR 76 | Wt 399.0 lb

## 2023-04-03 VITALS — BP 142/88 | HR 85 | Ht 64.0 in | Wt 399.8 lb

## 2023-04-03 DIAGNOSIS — Z3A32 32 weeks gestation of pregnancy: Secondary | ICD-10-CM | POA: Diagnosis not present

## 2023-04-03 DIAGNOSIS — O0993 Supervision of high risk pregnancy, unspecified, third trimester: Secondary | ICD-10-CM

## 2023-04-03 DIAGNOSIS — Z23 Encounter for immunization: Secondary | ICD-10-CM

## 2023-04-03 DIAGNOSIS — O10913 Unspecified pre-existing hypertension complicating pregnancy, third trimester: Secondary | ICD-10-CM

## 2023-04-03 DIAGNOSIS — I10 Essential (primary) hypertension: Secondary | ICD-10-CM | POA: Diagnosis not present

## 2023-04-03 DIAGNOSIS — O99213 Obesity complicating pregnancy, third trimester: Secondary | ICD-10-CM | POA: Diagnosis not present

## 2023-04-03 DIAGNOSIS — O10919 Unspecified pre-existing hypertension complicating pregnancy, unspecified trimester: Secondary | ICD-10-CM

## 2023-04-03 DIAGNOSIS — Z6841 Body Mass Index (BMI) 40.0 and over, adult: Secondary | ICD-10-CM

## 2023-04-03 DIAGNOSIS — O09299 Supervision of pregnancy with other poor reproductive or obstetric history, unspecified trimester: Secondary | ICD-10-CM | POA: Diagnosis not present

## 2023-04-03 LAB — POCT URINALYSIS DIPSTICK OB
Blood, UA: NEGATIVE
Glucose, UA: NEGATIVE
Ketones, UA: NEGATIVE
Nitrite, UA: NEGATIVE
POC,PROTEIN,UA: NEGATIVE

## 2023-04-03 MED ORDER — POTASSIUM CHLORIDE CRYS ER 20 MEQ PO TBCR: 20.0000 meq | EXTENDED_RELEASE_TABLET | Freq: Every day | ORAL | 0 refills | Status: DC

## 2023-04-03 MED ORDER — NIFEDIPINE ER 30 MG PO TB24: 30.0000 mg | ORAL_TABLET | Freq: Every evening | ORAL | 3 refills | Status: AC

## 2023-04-03 MED ORDER — NIFEDIPINE ER 90 MG PO TB24: 90.0000 mg | ORAL_TABLET | Freq: Every day | ORAL | 3 refills | Status: AC

## 2023-04-03 MED ORDER — FUROSEMIDE 40 MG PO TABS: 40.0000 mg | ORAL_TABLET | Freq: Every day | ORAL | 0 refills | Status: DC

## 2023-04-03 NOTE — Progress Notes (Signed)
[place] 32 wks,cephalic,BPP 8/8,posterior placenta gr 0,FHR 148 bpm,AFI 19 cm,EFW 1880 g 39%,RI .74,.74,.69=92%

## 2023-04-03 NOTE — Progress Notes (Signed)
Cardio-Obstetrics Clinic  Follow Up Note   Date:  04/03/2023   ID:  Kristina Scott, DOB 1991/05/20, MRN [id number]  PCP:  Patient, No Pcp Per   [facility] Providers Cardiologist:  Thomasene Ripple, DO  Electrophysiologist:  None        Referring MD: No ref. provider found   Chief Complaint: " I am ok"  History of Present Illness:    Kristina Scott is a 32 y.o. female [G3P2002] who returns for follow up of history of preeclampsia, chronic hypertension in pregnancy normal obesity here today for follow-up visit.  She is currently [redacted] weeks pregnant.  She was last seen in the cardio OB clinic by Dr. Laurance Flatten on January 23, 2023 at that time an echocardiogram was ordered with normal function.  She is here today for follow-up visit. She offers no specific compliants.   Prior CV Studies Reviewed: The following studies were reviewed today: TTE March 18, 2023 IMPRESSIONS    1. Left ventricular ejection fraction, by estimation, is 60 to 65%. Left  ventricular ejection fraction by 3D volume is 66 %. The left ventricle has  normal function. The left ventricle has no regional wall motion  abnormalities. Left ventricular diastolic   parameters were normal.   2. Right ventricular systolic function is normal. The right ventricular  size is normal. Tricuspid regurgitation signal is inadequate for assessing  PA pressure.   3. Right atrial size was mildly dilated.   4. The mitral valve is normal in structure. No evidence of mitral valve  regurgitation. No evidence of mitral stenosis.   5. The aortic valve is normal in structure. Aortic valve regurgitation is  not visualized. No aortic stenosis is present.   6. The inferior vena cava is normal in size with greater than 50%  respiratory variability, suggesting right atrial pressure of 3 mmHg.   Comparison(s): Increased flows are seen across all valves, consistent with  high cardiac output of pregnancy. The mild biatrial  dilation is also  likely to be physiological.   Conclusion(s)/Recommendation(s): Normal biventricular function without  evidence of hemodynamically significant valvular heart disease.   FINDINGS   Left Ventricle: Left ventricular ejection fraction, by estimation, is 60  to 65%. Left ventricular ejection fraction by 3D volume is 66 %. The left  ventricle has normal function. The left ventricle has no regional wall  motion abnormalities. The left  ventricular internal cavity size was normal in size. There is no left  ventricular hypertrophy. Left ventricular diastolic parameters were  normal.   Right Ventricle: The right ventricular size is normal. No increase in  right ventricular wall thickness. Right ventricular systolic function is  normal. Tricuspid regurgitation signal is inadequate for assessing PA  pressure.   Left Atrium: Left atrial size was normal in size.   Right Atrium: Right atrial size was mildly dilated.   Pericardium: There is no evidence of pericardial effusion.   Mitral Valve: The mitral valve is normal in structure. No evidence of  mitral valve regurgitation. No evidence of mitral valve stenosis.   Tricuspid Valve: The tricuspid valve is normal in structure. Tricuspid  valve regurgitation is not demonstrated. No evidence of tricuspid  stenosis.   Aortic Valve: The aortic valve is normal in structure. Aortic valve  regurgitation is not visualized. No aortic stenosis is present. Aortic  valve mean gradient measures 8.0 mmHg. Aortic valve peak gradient measures  15.5 mmHg. Aortic valve area, by VTI  measures 2.86 cm.   Pulmonic Valve:  The pulmonic valve was normal in structure. Pulmonic valve  regurgitation is not visualized. No evidence of pulmonic stenosis.   Aorta: The aortic root is normal in size and structure.   Venous: The inferior vena cava is normal in size with greater than 50%  respiratory variability, suggesting right atrial pressure of 3 mmHg.    IAS/Shunts: No atrial level shunt detected by color flow Doppler.     Past Medical History:  Diagnosis Date   Contraceptive management 12/28/2013   Encounter for Nexplanon removal 12/28/2013   High blood pressure affecting pregnancy in third trimester, delivered    Hypertension    Obesity    Pregnant state, incidental 08/23/2014    Past Surgical History:  Procedure Laterality Date   WISDOM TOOTH EXTRACTION        OB History     Gravida  3   Para  2   Term  2   Preterm      AB      Living  2      SAB      IAB      Ectopic      Multiple  0   Live Births  2               Current Medications: Current Meds  Medication Sig   furosemide (LASIX) 40 MG tablet Take 1 tablet (40 mg total) by mouth daily for 2 days. For 2 days.   NIFEdipine (ADALAT CC) 30 MG 24 hr tablet Take 1 tablet (30 mg total) by mouth at bedtime.   potassium chloride SA (KLOR-CON M20) 20 MEQ tablet Take 1 tablet (20 mEq total) by mouth daily for 2 days. For 2 days.     Allergies:   Patient has no known allergies.   Social History   Socioeconomic History   Marital status: Married    Spouse name: Not on file   Number of children: Not on file   Years of education: Not on file   Highest education level: Not on file  Occupational History   Not on file  Tobacco Use   Smoking status: Former    Current packs/day: 0.00    Types: Cigarettes    Quit date: 03/07/2008    Years since quitting: 15.0   Smokeless tobacco: Never  Vaping Use   Vaping status: Never Used  Substance and Sexual Activity   Alcohol use: No    Alcohol/week: 0.0 standard drinks of alcohol   Drug use: No   Sexual activity: Yes    Birth control/protection: None  Other Topics Concern   Not on file  Social History Narrative   Not on file   Social Determinants of Health   Financial Resource Strain: Low Risk  (10/16/2022)   Overall Financial Resource Strain (CARDIA)    Difficulty of Paying Living Expenses:  Not hard at all  Food Insecurity: No Food Insecurity (10/16/2022)   Hunger Vital Sign    Worried About Running Out of Food in the Last Year: Never true    Ran Out of Food in the Last Year: Never true  Transportation Needs: No Transportation Needs (10/16/2022)   PRAPARE - Administrator, Civil Service (Medical): No    Lack of Transportation (Non-Medical): No  Physical Activity: Insufficiently Active (10/16/2022)   Exercise Vital Sign    Days of Exercise per Week: 3 days    Minutes of Exercise per Session: 20 min  Stress: No Stress Concern Present (10/16/2022)  [employer] of Occupational Health - Occupational Stress Questionnaire    Feeling of Stress : Not at all  Social Connections: Moderately Integrated (10/16/2022)   Social Connection and Isolation Panel [NHANES]    Frequency of Communication with Friends and Family: More than three times a week    Frequency of Social Gatherings with Friends and Family: Twice a week    Attends Religious Services: 1 to 4 times per year    Active Member of [employer] or Organizations: No    Attends Banker Meetings: Never    Marital Status: Married      Family History  Problem Relation Age of Onset   Stroke Maternal Grandfather       ROS:   Please see the history of present illness.     All other systems reviewed and are negative.   Labs/EKG Reviewed:    EKG:   EKG was ordered today.    Recent Labs: 11/24/2022: ALT 35; BUN 7; Creatinine, Ser 0.65; Potassium 4.2; Sodium 135 03/20/2023: Hemoglobin 10.3; Platelets 292   Recent Lipid Panel No results found for: "CHOL", "TRIG", "HDL", "CHOLHDL", "LDLCALC", "LDLDIRECT"  Physical Exam:    VS:  BP (!) 142/88 (BP Location: Left Arm, Patient Position: Sitting, Cuff Size: Large)   Pulse 85   Ht 5\' 4"  (1.626 m)   Wt (!) 399 lb 12.8 oz (181.3 kg)   LMP 08/22/2022 (Approximate)   SpO2 100%   BMI 68.63 kg/m     Wt Readings from Last 3 Encounters:  04/03/23 (!) 399 lb  12.8 oz (181.3 kg)  04/03/23 (!) 399 lb (181 kg)  03/20/23 (!) 396 lb 12.8 oz (180 kg)     GEN:  Well nourished, well developed in no acute distress HEENT: Normal NECK: No JVD; No carotid bruits LYMPHATICS: No lymphadenopathy CARDIAC: RRR, no murmurs, rubs, gallops RESPIRATORY:  Clear to auscultation without rales, wheezing or rhonchi  ABDOMEN: Soft, non-tender, non-distended MUSCULOSKELETAL:  +2 bilateral LE edema; No deformity  SKIN: Warm and dry NEUROLOGIC:  Alert and oriented x 3 PSYCHIATRIC:  Normal affect    Risk Assessment/Risk Calculators:     CARPREG II Risk Prediction Index Score:  1.  The patient's risk for a primary cardiac event is 5%.            ASSESSMENT & PLAN:    Chronic hypertension pregnancy Morbid obesity 32 weeks of gestation  Blood pressure is elevated in the office today.  I am going to increase her nifedipine she will take 90 mg in the morning and then 30 mg at night. She does have significant leg swelling.  2 days of Lasix will be given to the patient to help with this which I am hoping that this will also help with her pressure.  She also be seen in the clinic here by our cardio B pharmacist will give the patient some counseling.  And she will follow-up with the cardio B pharmacy team in 2 weeks.  She will see me in 4 weeks.   Patient Instructions  Medication Instructions:  Your physician has recommended you make the following change in your medication: For 2 days: Lasix 40 mg once daily For 2 days: Potassium 20 mEq once daily  CHANGE: Nifedipine 90 mg in the morning and 30 mg in the morning *If you need a refill on your cardiac medications before your next appointment, please call your pharmacy*   Lab Work: None   Testing/Procedures: None   Follow-Up: At [facility], you and your health needs are our priority.  As part of our continuing mission to provide you with exceptional heart care, we have created designated  Provider Care Teams.  These Care Teams include your primary Cardiologist (physician) and Advanced Practice Providers (APPs -  Physician Assistants and Nurse Practitioners) who all work together to provide you with the care you need, when you need it.   Your next appointment:   4 week(s)  Provider:   Thomasene Ripple, DO     Other Instructions Please see [place] [facility] (pharmacy in 2 weeks).     Dispo:  No follow-ups on file.   Medication Adjustments/Labs and Tests Ordered: Current medicines are reviewed at length with the patient today.  Concerns regarding medicines are outlined above.  Tests Ordered: Orders Placed This Encounter  Procedures   AMB Referral to Heartcare Pharm-D   Medication Changes: Meds ordered this encounter  Medications   furosemide (LASIX) 40 MG tablet    Sig: Take 1 tablet (40 mg total) by mouth daily for 2 days. For 2 days.    Dispense:  2 tablet    Refill:  0   potassium chloride SA (KLOR-CON M20) 20 MEQ tablet    Sig: Take 1 tablet (20 mEq total) by mouth daily for 2 days. For 2 days.    Dispense:  2 tablet    Refill:  0   NIFEdipine (ADALAT CC) 90 MG 24 hr tablet    Sig: Take 1 tablet (90 mg total) by mouth daily.    Dispense:  90 tablet    Refill:  3   NIFEdipine (ADALAT CC) 30 MG 24 hr tablet    Sig: Take 1 tablet (30 mg total) by mouth at bedtime.    Dispense:  90 tablet    Refill:  3

## 2023-04-03 NOTE — Patient Instructions (Signed)
Medication Instructions:  Your physician has recommended you make the following change in your medication: For 2 days: Lasix 40 mg once daily For 2 days: Potassium 20 mEq once daily  CHANGE: Nifedipine 90 mg in the morning and 30 mg in the morning *If you need a refill on your cardiac medications before your next appointment, please call your pharmacy*   Lab Work: None   Testing/Procedures: None   Follow-Up: At [facility], you and your health needs are our priority.  As part of our continuing mission to provide you with exceptional heart care, we have created designated Provider Care Teams.  These Care Teams include your primary Cardiologist (physician) and Advanced Practice Providers (APPs -  Physician Assistants and Nurse Practitioners) who all work together to provide you with the care you need, when you need it.   Your next appointment:   4 week(s)  Provider:   Thomasene Ripple, DO     Other Instructions Please see [place] [facility] (pharmacy in 2 weeks).

## 2023-04-03 NOTE — Progress Notes (Signed)
HIGH-RISK PREGNANCY VISIT Patient name: Kristina Scott MRN [id number]  Date of birth: 05/02/1991 Chief Complaint:   Routine Prenatal Visit (Ultrasound today)  History of Present Illness:   Kristina Scott is a 32 y.o. G65P2002 female at 110w0d with an Estimated Date of Delivery: 05/29/23 being seen today for ongoing management of a high-risk pregnancy complicated by:  -Chronic HTN:  At home 136/78 -home BP wnl though not checking regularly and pt notes feeling "rushed" today -cardiology appt later today  -Morbid obesity -HSV    Today she reports no complaints.   Contractions: Not present. Vag. Bleeding: None.  Movement: Present. denies leaking of fluid.      03/20/2023    9:08 AM 11/24/2022   11:17 AM 10/16/2022    1:57 PM  Depression screen PHQ 2/9  Decreased Interest 1 1 0  Down, Depressed, Hopeless 0 0 0  PHQ - 2 Score 1 1 0  Altered sleeping 0 0 0  Tired, decreased energy 2 2 2   Change in appetite 0 1 1  Feeling bad or failure about yourself  0 0 0  Trouble concentrating 0 0 0  Moving slowly or fidgety/restless 0 0 0  Suicidal thoughts 0 0 0  PHQ-9 Score 3 4 3      Current Outpatient Medications  Medication Instructions   acetaminophen (TYLENOL) 650 mg, Oral, Every 6 hours PRN   aspirin EC 81 MG tablet Take 2 daily   labetalol (NORMODYNE) 600 mg, Oral, 3 times daily   NIFEdipine (ADALAT CC) 90 mg, Oral, Daily   prenatal vitamin w/FE, FA (PRENATAL 1 + 1) 27-1 MG TABS tablet 1 tablet, Oral, Daily     Review of Systems:   Pertinent items are noted in HPI Denies abnormal vaginal discharge w/ itching/odor/irritation, headaches, visual changes, shortness of breath, chest pain, abdominal pain, severe nausea/vomiting, or problems with urination or bowel movements unless otherwise stated above. Pertinent History Reviewed:  Reviewed past medical,surgical, social, obstetrical and family history.  Reviewed problem list, medications and allergies. Physical Assessment:    Vitals:   04/03/23 1045 04/03/23 1047  BP: (!) 143/89 (!) 143/92  Pulse: 83 76  Weight: (!) 399 lb (181 kg)   Body mass index is 68.49 kg/m.           Physical Examination:   General appearance: alert, well appearing, and in no distress  Mental status: normal mood, behavior, speech, dress, motor activity, and thought processes  Skin: warm & dry   Extremities: Edema: Trace    Cardiovascular: normal heart rate noted  Respiratory: normal respiratory effort, no distress  Abdomen: gravid, soft, non-tender  Pelvic: Cervical exam deferred         Fetal Status:     Movement: Present    Fetal Surveillance Testing today: 32 wks,cephalic,BPP 8/8,posterior placenta gr 0,FHR 148 bpm,AFI 19 cm,EFW 1880 g 39%,RI .74,.74,.69=92%    Chaperone: N/A    Results for orders placed or performed in visit on 04/03/23 (from the past 24 hour(s))  POC Urinalysis Dipstick OB   Collection Time: 04/03/23 10:54 AM  Result Value Ref Range   Color, UA     Clarity, UA     Glucose, UA Negative Negative   Bilirubin, UA     Ketones, UA Negative    Spec Grav, UA     Blood, UA Negative    pH, UA     POC,PROTEIN,UA Negative Negative, Trace, Small (1+), Moderate (2+), Large (3+), 4+   Urobilinogen, UA  Nitrite, UA Negative    Leukocytes, UA Trace (A) Negative   Appearance     Odor       Assessment & Plan:  High-risk pregnancy: G3P2002 at [redacted]w[redacted]d with an Estimated Date of Delivery: 05/29/23   1) Severe Chronic HTN  -encouraged pt to check daily -will hold off on increasing meds today; however, if elevated at next week's checks will likely need to increase  2) morbid obesity  Meds: No orders of the defined types were placed in this encounter.   Labs/procedures today: growth/BPP, Tdap today  Treatment Plan:  antepartum testing twice weekly, growth every 4wks  Reviewed: Preterm labor symptoms and general obstetric precautions including but not limited to vaginal bleeding, contractions, leaking of  fluid and fetal movement were reviewed in detail with the patient.  All questions were answered. Pt has home bp cuff. Check bp weekly, let us know if >160/110  Follow-up: Return for twice weekly as scheduled ([facility]).   Future Appointments  Date Time Provider Department Center  04/03/2023 11:10 AM Myna Hidalgo, DO [facility]  04/03/2023  2:00 PM Tobb, Kardie, DO [facility] None  04/07/2023  9:50 AM [facility] NURSE [facility]  04/10/2023 10:00 AM [facility] [place] [facility] None  04/10/2023 10:50 AM Lazaro Arms, MD [facility]  04/14/2023  9:30 AM [facility] NURSE [facility]  04/17/2023 10:00 AM [facility] [place] [facility] None  04/17/2023 10:50 AM Lazaro Arms, MD [facility]  04/21/2023  9:30 AM [facility] NURSE [facility]  04/24/2023 10:00 AM [facility] [place] [facility] None  04/24/2023 10:50 AM Myna Hidalgo, DO [facility]  04/28/2023  9:30 AM [facility] NURSE [facility]  05/01/2023 10:00 AM [facility] [place] [facility] None  05/01/2023 10:50 AM Myna Hidalgo, DO [facility]  05/05/2023  9:30 AM [facility] NURSE [facility]  05/08/2023 10:00 AM [facility] [place] [facility] None  05/08/2023 10:50 AM Lazaro Arms, MD [facility]  05/12/2023  9:30 AM [facility] NURSE [facility]  05/12/2023  9:50 AM Lazaro Arms, MD [facility]  05/15/2023 10:00 AM [facility] [place] [facility] None  05/19/2023  9:30 AM [facility] NURSE [facility]  05/22/2023 10:00 AM [facility] [place] [facility] None  05/26/2023  9:30 AM [facility] N[facility]SE [facility]    Orders Placed This Encounter  Procedures   Tdap vaccine greater than or equal to 7yo IM   POC Urinalysis Dipstick OB    Myna Hidalgo, DO Attending Obstetrician & Gynecologist, [facility] for [employer], [facility]

## 2023-04-03 NOTE — Progress Notes (Deleted)
Spoke with patient in room after appt with Dr Servando Salina. Has home blood pressure cuff however due to arm size. Checks blood pressure using her wrist. Reports readings in 140s/80s.   Works at [employer], has to be at work in morning at [employer]. Has 2 other children at home and is not getting much rest.   HTN f/u scheduled in 2-3 weeks at [facility]. Asked patient to bring cuff/log.

## 2023-04-07 ENCOUNTER — Ambulatory Visit: Payer: Medicaid Other | Admitting: *Deleted

## 2023-04-07 VITALS — BP 142/85 | HR 90

## 2023-04-07 DIAGNOSIS — Z3A34 34 weeks gestation of pregnancy: Secondary | ICD-10-CM | POA: Diagnosis not present

## 2023-04-07 DIAGNOSIS — Z6841 Body Mass Index (BMI) 40.0 and over, adult: Secondary | ICD-10-CM | POA: Diagnosis not present

## 2023-04-07 DIAGNOSIS — I10 Essential (primary) hypertension: Secondary | ICD-10-CM | POA: Diagnosis not present

## 2023-04-07 DIAGNOSIS — O0993 Supervision of high risk pregnancy, unspecified, third trimester: Secondary | ICD-10-CM | POA: Diagnosis not present

## 2023-04-07 NOTE — Progress Notes (Signed)
   NURSE VISIT- NST  SUBJECTIVE:  Kristina Scott is a 32 y.o. G62P2002 female at [redacted]w[redacted]d, here for a NST for pregnancy complicated by [facility] and morbid obesity.  She reports active fetal movement, contractions: none, vaginal bleeding: none, membranes: intact.   OBJECTIVE:  BP (!) 142/85   Pulse 90   LMP 08/22/2022 (Approximate)   Appears well, no apparent distress  No results found for this or any previous visit (from the past 24 hour(s)).  NST: FHR baseline 140 bpm, Variability: moderate, Accelerations:present, Decelerations:  Absent= Cat 1/non-reactive but appropriate for gestational age Toco: none   ASSESSMENT: G3P2002 at [redacted]w[redacted]d with CHTN and Morbid obesity (BMI >=40) NST non-reactive, but reassuring for gestational age  PLAN: EFM strip reviewed by Dr. Despina Hidden   Recommendations: keep next appointment as scheduled    [name]  04/07/2023 12:13 PM

## 2023-04-09 ENCOUNTER — Other Ambulatory Visit: Payer: Self-pay | Admitting: Obstetrics & Gynecology

## 2023-04-09 DIAGNOSIS — O10919 Unspecified pre-existing hypertension complicating pregnancy, unspecified trimester: Secondary | ICD-10-CM

## 2023-04-09 DIAGNOSIS — O99213 Obesity complicating pregnancy, third trimester: Secondary | ICD-10-CM

## 2023-04-10 ENCOUNTER — Ambulatory Visit (INDEPENDENT_AMBULATORY_CARE_PROVIDER_SITE_OTHER): Payer: Medicaid Other

## 2023-04-10 ENCOUNTER — Ambulatory Visit (INDEPENDENT_AMBULATORY_CARE_PROVIDER_SITE_OTHER): Payer: Medicaid Other | Admitting: Obstetrics & Gynecology

## 2023-04-10 VITALS — BP 143/84 | HR 82 | Wt 394.6 lb

## 2023-04-10 DIAGNOSIS — O99213 Obesity complicating pregnancy, third trimester: Secondary | ICD-10-CM | POA: Diagnosis not present

## 2023-04-10 DIAGNOSIS — O0993 Supervision of high risk pregnancy, unspecified, third trimester: Secondary | ICD-10-CM

## 2023-04-10 DIAGNOSIS — O10919 Unspecified pre-existing hypertension complicating pregnancy, unspecified trimester: Secondary | ICD-10-CM

## 2023-04-10 DIAGNOSIS — O10913 Unspecified pre-existing hypertension complicating pregnancy, third trimester: Secondary | ICD-10-CM

## 2023-04-10 DIAGNOSIS — Z3A33 33 weeks gestation of pregnancy: Secondary | ICD-10-CM | POA: Diagnosis not present

## 2023-04-10 DIAGNOSIS — O0992 Supervision of high risk pregnancy, unspecified, second trimester: Secondary | ICD-10-CM

## 2023-04-10 MED ORDER — LABETALOL HCL 200 MG PO TABS: 800.0000 mg | ORAL_TABLET | Freq: Three times a day (TID) | ORAL | 3 refills | Status: DC

## 2023-04-10 NOTE — Progress Notes (Signed)
HIGH-RISK PREGNANCY VISIT Patient name: Kristina Scott MRN [id number]  Date of birth: [birth year] Chief Complaint:   Routine Prenatal Visit  History of Present Illness:   Kristina Scott is a 32 y.o. G13P2002 female at [redacted]w[redacted]d with an Estimated Date of Delivery: 05/29/23 being seen today for ongoing management of a high-risk pregnancy complicated by cHTN, resistent, on procardia xl 90 am + 30 hs and labetalol 600 TID^today 800 TID.    Today she reports no complaints. Contractions: Not present. Vag. Bleeding: None.  Movement: Present. denies leaking of fluid.      03/20/2023    9:08 AM 11/24/2022   11:17 AM 10/16/2022    1:57 PM  Depression screen PHQ 2/9  Decreased Interest 1 1 0  Down, Depressed, Hopeless 0 0 0  PHQ - 2 Score 1 1 0  Altered sleeping 0 0 0  Tired, decreased energy 2 2 2   Change in appetite 0 1 1  Feeling bad or failure about yourself  0 0 0  Trouble concentrating 0 0 0  Moving slowly or fidgety/restless 0 0 0  Suicidal thoughts 0 0 0  PHQ-9 Score 3 4 3         11/24/2022   11:17 AM 10/16/2022    1:57 PM  GAD 7 : Generalized Anxiety Score  Nervous, Anxious, on Edge 0 0  Control/stop worrying 0 0  Worry too much - different things 0 0  Trouble relaxing 0 0  Restless 0 0  Easily annoyed or irritable 2 0  Afraid - awful might happen 0 0  Total GAD 7 Score 2 0     Review of Systems:   Pertinent items are noted in HPI Denies abnormal vaginal discharge w/ itching/odor/irritation, headaches, visual changes, shortness of breath, chest pain, abdominal pain, severe nausea/vomiting, or problems with urination or bowel movements unless otherwise stated above. Pertinent History Reviewed:  Reviewed past medical,surgical, social, obstetrical and family history.  Reviewed problem list, medications and allergies. Physical Assessment:   Vitals:   04/10/23 1051  BP: (!) 149/98  Pulse: 80  Weight: (!) 394 lb 9.6 oz (179 kg)  Body mass index is 67.73 kg/m.            Physical Examination:   General appearance: alert, well appearing, and in no distress  Mental status: alert, oriented to person, place, and time  Skin: warm & dry   Extremities:      Cardiovascular: normal heart rate noted  Respiratory: normal respiratory effort, no distress  Abdomen: gravid, soft, non-tender  Pelvic: Cervical exam deferred         Fetal Status:     Movement: Present    Fetal Surveillance Testing today: BVPP 8/8 UAD 85%   Chaperone: N/A    No results found for this or any previous visit (from the past 24 hour(s)).  Assessment & Plan:  High-risk pregnancy: G3P2002 at [redacted]w[redacted]d with an Estimated Date of Delivery: 05/29/23      ICD-10-CM   1. Supervision of high risk pregnancy in third trimester  O09.93     2. Chronic hypertension: Procardia xl 90 am + 30 hs and labetalol 600 TID^800 TID  O10.919         Meds:  Meds ordered this encounter  Medications   labetalol (NORMODYNE) 200 MG tablet    Sig: Take 4 tablets (800 mg total) by mouth 3 (three) times daily.    Dispense:  360 tablet    Refill:  3    Orders: No orders of the defined types were placed in this encounter.    Labs/procedures today: U/S  Treatment Plan:  twice weekly surveillance  Reviewed: Preterm labor symptoms and general obstetric precautions including but not limited to vaginal bleeding, contractions, leaking of fluid and fetal movement were reviewed in detail with the patient.  All questions were answered. Does have home bp cuff. Office bp cuff given: not applicable. Check bp twice daily, let us know if consistently >150 and/or >95.  Follow-up: No follow-ups on file.   Future Appointments  Date Time Provider Department Center  04/14/2023  9:30 AM [facility] NURSE [facility]  04/17/2023 10:00 AM [facility] [place] [facility] None  04/17/2023 10:50 AM Lazaro Arms, MD [facility]  04/21/2023  9:30 AM [facility] NURSE [facility]  04/23/2023  9:00 AM [facility] PHARMACIST  [facility] None  04/24/2023 10:00 AM [facility] [place] [facility] None  04/24/2023 10:50 AM Myna Hidalgo, DO [facility]  04/28/2023  9:30 AM [facility] NURSE [facility]  04/29/2023  9:20 AM Tobb, Kardie, DO [facility] None  05/01/2023 10:00 AM [facility] [place] [facility] None  05/01/2023 10:50 AM Myna Hidalgo, DO [facility]  05/05/2023  9:30 AM [facility] N[facility]SE [facility]  05/08/2023 10:00 AM [facility] [place] [facility] None  05/08/2023 10:50 AM Lazaro Arms, MD [facility]  05/12/2023  9:30 AM [facility] N[facility]SE [facility]  05/12/2023  9:50 AM Lazaro Arms, MD [facility]  05/15/2023 10:00 AM [facility] [place] [facility] None  05/19/2023  9:30 AM [facility] NURSE [facility]  05/22/2023 10:00 AM [facility] [place] [facility] None  05/22/2023 10:50 AM Myna Hidalgo, DO [facility]  05/26/2023  9:30 AM [facility] NURSE [facility]    No orders of the defined types were placed in this encounter.  Lazaro Arms  Attending Physician for the Center for [facility] 04/10/2023 11:00 AM

## 2023-04-10 NOTE — Progress Notes (Signed)
[place] 33 wks,frank breech,posterior fundal placenta gr 3,BPP 8/8,AFI 22 cm,RI .70,.68,.68=85%,FHR 148 bpm

## 2023-04-14 ENCOUNTER — Encounter: Payer: Self-pay | Admitting: Obstetrics & Gynecology

## 2023-04-14 ENCOUNTER — Ambulatory Visit: Payer: Medicaid Other | Admitting: *Deleted

## 2023-04-14 VITALS — BP 127/77 | HR 87

## 2023-04-14 DIAGNOSIS — Z3A33 33 weeks gestation of pregnancy: Secondary | ICD-10-CM

## 2023-04-14 DIAGNOSIS — I10 Essential (primary) hypertension: Secondary | ICD-10-CM | POA: Diagnosis not present

## 2023-04-14 DIAGNOSIS — Z6841 Body Mass Index (BMI) 40.0 and over, adult: Secondary | ICD-10-CM

## 2023-04-14 DIAGNOSIS — O0993 Supervision of high risk pregnancy, unspecified, third trimester: Secondary | ICD-10-CM

## 2023-04-14 NOTE — Progress Notes (Signed)
   NURSE VISIT- NST  SUBJECTIVE:  Kristina Scott is a 32 y.o. G54P2002 female at [redacted]w[redacted]d, here for a NST for pregnancy complicated by [facility] and Morbid obesity (BMI >=40).  She reports active fetal movement, contractions: none, vaginal bleeding: none, membranes: intact.   OBJECTIVE:  BP 127/77   Pulse 87   LMP 08/22/2022 (Approximate)   Appears well, no apparent distress  No results found for this or any previous visit (from the past 24 hour(s)).  NST: FHR baseline 140 bpm, Variability: moderate, Accelerations:present, Decelerations:  Absent= Cat 1/reactive Toco: none   ASSESSMENT: G3P2002 at [redacted]w[redacted]d with CHTN and Morbid obesity (BMI >=40) NST reactive  PLAN: EFM strip reviewed by Dr. Despina Hidden   Recommendations: keep next appointment as scheduled    [name]  04/14/2023 11:18 AM

## 2023-04-16 ENCOUNTER — Other Ambulatory Visit: Payer: Self-pay | Admitting: Obstetrics & Gynecology

## 2023-04-16 DIAGNOSIS — O10919 Unspecified pre-existing hypertension complicating pregnancy, unspecified trimester: Secondary | ICD-10-CM

## 2023-04-16 DIAGNOSIS — O99213 Obesity complicating pregnancy, third trimester: Secondary | ICD-10-CM

## 2023-04-17 ENCOUNTER — Ambulatory Visit (INDEPENDENT_AMBULATORY_CARE_PROVIDER_SITE_OTHER): Payer: Medicaid Other

## 2023-04-17 ENCOUNTER — Ambulatory Visit (INDEPENDENT_AMBULATORY_CARE_PROVIDER_SITE_OTHER): Payer: Medicaid Other | Admitting: Obstetrics & Gynecology

## 2023-04-17 ENCOUNTER — Encounter: Payer: Self-pay | Admitting: Obstetrics & Gynecology

## 2023-04-17 VITALS — BP 138/86 | HR 81 | Wt 393.8 lb

## 2023-04-17 DIAGNOSIS — Z3A34 34 weeks gestation of pregnancy: Secondary | ICD-10-CM | POA: Diagnosis not present

## 2023-04-17 DIAGNOSIS — O10919 Unspecified pre-existing hypertension complicating pregnancy, unspecified trimester: Secondary | ICD-10-CM

## 2023-04-17 DIAGNOSIS — O99213 Obesity complicating pregnancy, third trimester: Secondary | ICD-10-CM | POA: Diagnosis not present

## 2023-04-17 DIAGNOSIS — O10913 Unspecified pre-existing hypertension complicating pregnancy, third trimester: Secondary | ICD-10-CM

## 2023-04-17 DIAGNOSIS — O0993 Supervision of high risk pregnancy, unspecified, third trimester: Secondary | ICD-10-CM

## 2023-04-17 NOTE — Progress Notes (Signed)
[place] 34 wks,cephalic,posterior fundal placenta gr 3,AFI 20 wks,BPP 8/8,FHR 146 bpm,RI .69,.63,.66=79%

## 2023-04-17 NOTE — Progress Notes (Signed)
HIGH-RISK PREGNANCY VISIT Patient name: Kristina Scott MRN [id number]  Date of birth: 24-Oct-1990 Chief Complaint:   Routine Prenatal Visit (Ultrasound today)  History of Present Illness:   Kristina Scott is a 32 y.o. G36P2002 female at [redacted]w[redacted]d with an Estimated Date of Delivery: 05/29/23 being seen today for ongoing management of a high-risk pregnancy complicated by chronic hypertension currently on Porcardia xl 90 qAM + 30 mg at bedtime plus labetalol 800 TID + ASA 162 mg.    Today she reports no complaints. Contractions: Not present. Vag. Bleeding: None.  Movement: Present. denies leaking of fluid.      03/20/2023    9:08 AM 11/24/2022   11:17 AM 10/16/2022    1:57 PM  Depression screen PHQ 2/9  Decreased Interest 1 1 0  Down, Depressed, Hopeless 0 0 0  PHQ - 2 Score 1 1 0  Altered sleeping 0 0 0  Tired, decreased energy 2 2 2   Change in appetite 0 1 1  Feeling bad or failure about yourself  0 0 0  Trouble concentrating 0 0 0  Moving slowly or fidgety/restless 0 0 0  Suicidal thoughts 0 0 0  PHQ-9 Score 3 4 3         11/24/2022   11:17 AM 10/16/2022    1:57 PM  GAD 7 : Generalized Anxiety Score  Nervous, Anxious, on Edge 0 0  Control/stop worrying 0 0  Worry too much - different things 0 0  Trouble relaxing 0 0  Restless 0 0  Easily annoyed or irritable 2 0  Afraid - awful might happen 0 0  Total GAD 7 Score 2 0     Review of Systems:   Pertinent items are noted in HPI Denies abnormal vaginal discharge w/ itching/odor/irritation, headaches, visual changes, shortness of breath, chest pain, abdominal pain, severe nausea/vomiting, or problems with urination or bowel movements unless otherwise stated above. Pertinent History Reviewed:  Reviewed past medical,surgical, social, obstetrical and family history.  Reviewed problem list, medications and allergies. Physical Assessment:   Vitals:   04/17/23 1057  BP: 138/86  Pulse: 81  Weight: (!) 393 lb 12.8 oz (178.6 kg)   Body mass index is 67.6 kg/m.           Physical Examination:   General appearance: alert, well appearing, and in no distress  Mental status: alert, oriented to person, place, and time  Skin: warm & dry   Extremities: Edema: None    Cardiovascular: normal heart rate noted  Respiratory: normal respiratory effort, no distress  Abdomen: gravid, soft, non-tender  Pelvic: Cervical exam deferred         Fetal Status:     Movement: Present    Fetal Surveillance Testing today: BPP 8/8 with UAD 78%   Chaperone: N/A    No results found for this or any previous visit (from the past 24 hour(s)).  Assessment & Plan:  High-risk pregnancy: G3P2002 at [redacted]w[redacted]d with an Estimated Date of Delivery: 05/29/23      ICD-10-CM   1. Supervision of high risk pregnancy in third trimester  O09.93     2. Chronic hypertension: Procardia xl 90 am + 30 hs and labetalol 800 TID  O10.919    BPP 8/8 UAD 78%        Meds: No orders of the defined types were placed in this encounter.   Orders: No orders of the defined types were placed in this encounter.    Labs/procedures today:  U/S  Treatment Plan:  twice weekly surveillance IOL 37wod or as indicated  Reviewed: Preterm labor symptoms and general obstetric precautions including but not limited to vaginal bleeding, contractions, leaking of fluid and fetal movement were reviewed in detail with the patient.  All questions were answered. Does have home bp cuff. Office bp cuff given: not applicable. Check bp twice daily, let us know if consistently >150 and/or >95.  Follow-up: Return for keep scheduled.   Future Appointments  Date Time Provider Department Center  04/21/2023  9:30 AM [facility] NURSE [facility]  04/23/2023  9:00 AM [facility] PHARMAC[facility] None  04/24/2023 10:00 AM [facility] [place] [facility] None  04/24/2023 10:50 AM Myna Hidalgo, DO [facility]  04/28/2023  9:30 AM [facility] NURSE [facility]  04/29/2023  9:20 AM Tobb,  Kardie, DO [facility] None  05/01/2023 10:00 AM [facility] [place] [facility] None  05/01/2023 10:50 AM Myna Hidalgo, DO [facility]  05/05/2023  9:30 AM [facility] NURSE [facility]  05/08/2023 10:00 AM [facility] [place] [facility] None  05/08/2023 10:50 AM Lazaro Arms, MD [facility]  05/12/2023  9:30 AM [facility] NURSE [facility]  05/12/2023  9:50 AM Lazaro Arms, MD [facility]  05/15/2023 10:00 AM [facility] [place] [facility] None  05/19/2023  9:30 AM [facility] NURSE [facility]  05/22/2023 10:00 AM [facility] [place] [facility] None  05/22/2023 10:50 AM Myna Hidalgo, DO [facility]  05/26/2023  9:30 AM [facility] NURSE [facility]    No orders of the defined types were placed in this encounter.  Lazaro Arms  Attending Physician for the Center for [facility] 04/17/2023 11:09 AM

## 2023-04-21 ENCOUNTER — Ambulatory Visit (INDEPENDENT_AMBULATORY_CARE_PROVIDER_SITE_OTHER): Payer: Medicaid Other | Admitting: *Deleted

## 2023-04-21 DIAGNOSIS — Z6841 Body Mass Index (BMI) 40.0 and over, adult: Secondary | ICD-10-CM

## 2023-04-21 DIAGNOSIS — O0993 Supervision of high risk pregnancy, unspecified, third trimester: Secondary | ICD-10-CM

## 2023-04-21 DIAGNOSIS — Z3A34 34 weeks gestation of pregnancy: Secondary | ICD-10-CM | POA: Diagnosis not present

## 2023-04-21 DIAGNOSIS — I10 Essential (primary) hypertension: Secondary | ICD-10-CM | POA: Diagnosis not present

## 2023-04-21 NOTE — Progress Notes (Signed)
   NURSE VISIT- NST  SUBJECTIVE:  Kristina Scott is a 32 y.o. G65P2002 female at [redacted]w[redacted]d, here for a NST for pregnancy complicated by [facility] and Morbid obesity (BMI >=40).  She reports active fetal movement, contractions: none, vaginal bleeding: none, membranes: intact.   OBJECTIVE:  LMP 08/22/2022 (Approximate)   Appears well, no apparent distress  No results found for this or any previous visit (from the past 24 hour(s)).  NST: FHR baseline 140 bpm, Variability: moderate, Accelerations:present(10x10) Decelerations:  Absent= Cat 1/reactive Toco: none   ASSESSMENT: G3P2002 at [redacted]w[redacted]d with CHTN and Morbid obesity (BMI >=40) NST reactive  PLAN: EFM strip reviewed by Dr. Despina Hidden   Recommendations: keep next appointment as scheduled    [name]  04/21/2023 10:38 AM

## 2023-04-23 ENCOUNTER — Other Ambulatory Visit: Payer: Self-pay | Admitting: Obstetrics & Gynecology

## 2023-04-23 ENCOUNTER — Encounter: Payer: Self-pay | Admitting: Pharmacist

## 2023-04-23 ENCOUNTER — Ambulatory Visit: Payer: Medicaid Other | Attending: Internal Medicine | Admitting: Pharmacist

## 2023-04-23 VITALS — BP 126/83 | HR 70 | Wt 395.8 lb

## 2023-04-23 DIAGNOSIS — O0993 Supervision of high risk pregnancy, unspecified, third trimester: Secondary | ICD-10-CM | POA: Diagnosis not present

## 2023-04-23 DIAGNOSIS — O10919 Unspecified pre-existing hypertension complicating pregnancy, unspecified trimester: Secondary | ICD-10-CM

## 2023-04-23 DIAGNOSIS — O09299 Supervision of pregnancy with other poor reproductive or obstetric history, unspecified trimester: Secondary | ICD-10-CM | POA: Diagnosis not present

## 2023-04-23 DIAGNOSIS — O99213 Obesity complicating pregnancy, third trimester: Secondary | ICD-10-CM

## 2023-04-23 DIAGNOSIS — Z3A34 34 weeks gestation of pregnancy: Secondary | ICD-10-CM | POA: Diagnosis not present

## 2023-04-23 DIAGNOSIS — I10 Essential (primary) hypertension: Secondary | ICD-10-CM

## 2023-04-23 NOTE — Progress Notes (Signed)
Patient ID: Kristina Scott                 DOB: July 06, 1991                      MRN: [id number]     HPI: Kristina Scott is a 32 y.o. female referred to cardio obstetrics clinic. PMH is significant for HTN, obesity, and preeclampsia in prior pregnancy. Patient is currently 34w 6d pregnant, [id number].  Patient presents today for HTN follow up. Has been checking BP at home using her wrist cuff. Did not bring cuff or log but reports improvements.  Currently on labetalol 800mg  TID and nifedipine 90mg  in morning, 30mg  in evening. Reports compliance and denies and adverse effects.  Previously she was going to be induced at 35 weeks so had had already requested time off of work. She believes being at home with her husband is also helping her blood pressure as her stress level has reduced considerably.  Trying to go for a walk each afternoon with her husband. Breakfast is yogurt and milk, lunch and dinner is fruit and a grilled chicken salad.  Daughter will be named Paramedic.  Current HTN meds:  Labetalol 800mg  TID Nifedipine 90mg  in the morning, 30mg  in evening   Wt Readings from Last 3 Encounters:  04/17/23 (!) 393 lb 12.8 oz (178.6 kg)  04/10/23 (!) 394 lb 9.6 oz (179 kg)  04/03/23 (!) 399 lb 12.8 oz (181.3 kg)   BP Readings from Last 3 Encounters:  04/17/23 138/86  04/14/23 127/77  04/10/23 (!) 143/84   Pulse Readings from Last 3 Encounters:  04/17/23 81  04/14/23 87  04/10/23 82    Renal function: CrCl cannot be calculated (Patient's most recent lab result is older than the maximum 21 days allowed.).  Past Medical History:  Diagnosis Date   Contraceptive management 12/28/2013   Encounter for Nexplanon removal 12/28/2013   High blood pressure affecting pregnancy in third trimester, delivered    Hypertension    Obesity    Pregnant state, incidental 08/23/2014    Current Outpatient Medications on File Prior to Visit  Medication Sig Dispense Refill   acetaminophen  (TYLENOL) 325 MG tablet Take 650 mg by mouth every 6 (six) hours as needed for headache.     aspirin EC 81 MG tablet Take 2 daily 60 tablet 12   furosemide (LASIX) 40 MG tablet Take 1 tablet (40 mg total) by mouth daily for 2 days. For 2 days. 2 tablet 0   labetalol (NORMODYNE) 200 MG tablet Take 4 tablets (800 mg total) by mouth 3 (three) times daily. 360 tablet 3   NIFEdipine (ADALAT CC) 30 MG 24 hr tablet Take 1 tablet (30 mg total) by mouth at bedtime. 90 tablet 3   NIFEdipine (ADALAT CC) 90 MG 24 hr tablet Take 1 tablet (90 mg total) by mouth daily. 90 tablet 3   prenatal vitamin w/FE, FA (PRENATAL 1 + 1) 27-1 MG TABS tablet Take 1 tablet by mouth daily at 12 noon. 30 tablet 12   No current facility-administered medications on file prior to visit.    No Known Allergies   Assessment/Plan:  1. Hypertension -  BP in room today at goal of 126/83. Likely mediation increases and less daily stressors are contributing. No medication changes needed at this time. Encouraged continued healthy eating and continuing exercise. Follow up with Dr Servando Salina in 1 week.  Continue: Labetalol 800mg  TID Nifedipine 90mg  in the  morning, 30mg  in evening F/u in 1 week w/ Dr Servando Salina  Laural Golden, PharmD, BCACP, CDCES, CPP [address] [place], [place], [address] Phone: [phone], Fax: [phone]

## 2023-04-23 NOTE — Patient Instructions (Signed)
It was good seeing you again  Your blood pressure today is much improved  Please continue your labetalol 800mg  (4 tablets) three times a a day and nifedipine 90mg  in the morning and 30mg  in the evening  Continue your healthy diet choices and walking in the afternoons  Continue 2 baby aspirins daily  Laural Golden, PharmD, BCACP, CDCES, CPP [address] [place], [place], [address] Phone: [phone], Fax: [phone]

## 2023-04-24 ENCOUNTER — Other Ambulatory Visit (HOSPITAL_COMMUNITY)
Admission: RE | Admit: 2023-04-24 | Discharge: 2023-04-24 | Disposition: A | Discharge status: Home / Self Care | Payer: Medicaid Other | Source: Ambulatory Visit | Attending: Obstetrics & Gynecology | Admitting: Obstetrics & Gynecology

## 2023-04-24 ENCOUNTER — Ambulatory Visit (INDEPENDENT_AMBULATORY_CARE_PROVIDER_SITE_OTHER): Payer: Medicaid Other | Admitting: Obstetrics & Gynecology

## 2023-04-24 ENCOUNTER — Encounter: Payer: Self-pay | Admitting: Obstetrics & Gynecology

## 2023-04-24 ENCOUNTER — Ambulatory Visit (INDEPENDENT_AMBULATORY_CARE_PROVIDER_SITE_OTHER): Payer: Medicaid Other

## 2023-04-24 VITALS — BP 145/86 | HR 95 | Wt 393.8 lb

## 2023-04-24 DIAGNOSIS — Z3A35 35 weeks gestation of pregnancy: Secondary | ICD-10-CM

## 2023-04-24 DIAGNOSIS — O10919 Unspecified pre-existing hypertension complicating pregnancy, unspecified trimester: Secondary | ICD-10-CM

## 2023-04-24 DIAGNOSIS — O0993 Supervision of high risk pregnancy, unspecified, third trimester: Secondary | ICD-10-CM

## 2023-04-24 DIAGNOSIS — O99213 Obesity complicating pregnancy, third trimester: Secondary | ICD-10-CM

## 2023-04-24 DIAGNOSIS — O10913 Unspecified pre-existing hypertension complicating pregnancy, third trimester: Secondary | ICD-10-CM | POA: Diagnosis not present

## 2023-04-24 DIAGNOSIS — A609 Anogenital herpesviral infection, unspecified: Secondary | ICD-10-CM

## 2023-04-24 MED ORDER — VALACYCLOVIR HCL 500 MG PO TABS
500.0000 mg | ORAL_TABLET | Freq: Two times a day (BID) | ORAL | 0 refills | Status: DC
Start: 2023-04-24 — End: 2023-05-10

## 2023-04-24 NOTE — Progress Notes (Signed)
HIGH-RISK PREGNANCY VISIT Patient name: Kristina Scott CANCEL MRN [id number]  Date of birth: 1991-02-22 Chief Complaint:   Routine Prenatal Visit  History of Present Illness:   Kristina Scott is a 32 y.o. G63P2002 female at [redacted]w[redacted]d with an Estimated Date of Delivery: 05/29/23 being seen today for ongoing management of a high-risk pregnancy complicated by:  Chronic HTN: currnetly on ProcardiaXL 90mg  in am and 30mg  pm, Labetalol 800mg  TID HSV- asymptomatic Morbid obesity  Today she reports no complaints.   Contractions: Not present. Vag. Bleeding: None.  Movement: Present. denies leaking of fluid.      03/20/2023    9:08 AM 11/24/2022   11:17 AM 10/16/2022    1:57 PM  Depression screen PHQ 2/9  Decreased Interest 1 1 0  Down, Depressed, Hopeless 0 0 0  PHQ - 2 Score 1 1 0  Altered sleeping 0 0 0  Tired, decreased energy 2 2 2   Change in appetite 0 1 1  Feeling bad or failure about yourself  0 0 0  Trouble concentrating 0 0 0  Moving slowly or fidgety/restless 0 0 0  Suicidal thoughts 0 0 0  PHQ-9 Score 3 4 3      Current Outpatient Medications  Medication Instructions   acetaminophen (TYLENOL) 650 mg, Oral, Every 6 hours PRN   aspirin EC 81 MG tablet Take 2 daily   furosemide (LASIX) 40 mg, Oral, Daily, For 2 days.   labetalol (NORMODYNE) 800 mg, Oral, 3 times daily   NIFEdipine (ADALAT CC) 90 mg, Oral, Daily   NIFEdipine (ADALAT CC) 30 mg, Oral, Nightly   prenatal vitamin w/FE, FA (PRENATAL 1 + 1) 27-1 MG TABS tablet 1 tablet, Oral, Daily   valACYclovir (VALTREX) 500 mg, Oral, 2 times daily     Review of Systems:   Pertinent items are noted in HPI Denies abnormal vaginal discharge w/ itching/odor/irritation, headaches, visual changes, shortness of breath, chest pain, abdominal pain, severe nausea/vomiting, or problems with urination or bowel movements unless otherwise stated above. Pertinent History Reviewed:  Reviewed past medical,surgical, social, obstetrical and family  history.  Reviewed problem list, medications and allergies. Physical Assessment:   Vitals:   04/24/23 1032  BP: (!) 145/86  Pulse: 95  Weight: (!) 393 lb 12.8 oz (178.6 kg)  Body mass index is 67.6 kg/m.           Physical Examination:   General appearance: alert, well appearing, and in no distress  Mental status: depressed mood  Skin: warm & dry   Extremities: Edema: Trace    Cardiovascular: normal heart rate noted  Respiratory: normal respiratory effort, no distress  Abdomen: gravid, soft, non-tender  Pelvic: Cervical exam performed  Dilation: Closed Effacement (%): Thick Station: -3 GBS, GC/C collected Fetal Status:     Movement: Present    Fetal Surveillance Testing today: ,cephalic,posterior fundal placenta gr 3,FHR 138 bpm,RI .66,.66,.65,.64=80%,AFI 17 cm,BPP 8/8    Chaperone: Latisha Cresenzo    No results found for this or any previous visit (from the past 24 hour(s)).   Assessment & Plan:  High-risk pregnancy: G3P2002 at [redacted]w[redacted]d with an Estimated Date of Delivery: 05/29/23   1) Chronic HTN- no change to current meds BPP reviewed, 8/8 Continue twice weekly visits IOL set up for 37wks- 10/18  2) HSV2 -asymptomatic -meds list reviewed, not on prophylaxis, valtrex sent in  3) morbid obesity 4) Contraception -Nexplanon to be removed -desires BTL (likely will be interval laparoscopic procedure)  Meds:  Meds ordered this  encounter  Medications   valACYclovir (VALTREX) 500 MG tablet    Sig: Take 1 tablet (500 mg total) by mouth 2 (two) times daily.    Dispense:  30 tablet    Refill:  0    Labs/procedures today: GBS, GC/C collected  Treatment Plan:  as outlined above, scheduled for IOL 37wk  Reviewed: Preterm labor symptoms and general obstetric precautions including but not limited to vaginal bleeding, contractions, leaking of fluid and fetal movement were reviewed in detail with the patient.  All questions were answered. Pt has home bp cuff. Check bp weekly,  let us know if >140/90. Preeclampsia precautions reviewed  Follow-up: Return for HROB visit AND NST/BPP weekly (twice weekly appt).   Future Appointments  Date Time Provider Department Center  04/28/2023  9:30 AM [facility] NURSE [facility]  04/29/2023  9:20 AM Tobb, Kardie, DO [facility] None  05/01/2023 10:00 AM [facility] [place] [facility] None  05/01/2023 10:50 AM Myna Hidalgo, DO [facility]  05/05/2023  9:30 AM [facility] NURSE [facility]  05/08/2023  6:30 AM [facility] SCHED ROOM [facility] None  05/08/2023 10:00 AM [facility] [place] [facility] None  05/08/2023 10:50 AM Lazaro Arms, MD [facility]  05/12/2023  9:30 AM [facility] NURSE [facility]  05/12/2023  9:50 AM Lazaro Arms, MD [facility]  05/15/2023 10:00 AM [facility] [place] [facility] None  05/19/2023  9:30 AM [facility] NURSE [facility]  05/22/2023 10:00 AM [facility] [place] [facility] None  05/22/2023 10:50 AM Myna Hidalgo, DO [facility]  05/26/2023  9:30 AM [facility] NURSE [facility]    Orders Placed This Encounter  Procedures   Culture, beta strep (group b only)    Myna Hidalgo, DO Attending Obstetrician & Gynecologist, [facility] [employer], [facility]

## 2023-04-24 NOTE — Progress Notes (Signed)
[place] 35 wks,cephalic,posterior fundal placenta gr 3,FHR 138 bpm,RI .66,.66,.65,.64=80%,AFI 17 cm,BPP 8/8

## 2023-04-27 LAB — CERVICOVAGINAL ANCILLARY ONLY
Chlamydia: NEGATIVE
Comment: NEGATIVE
Comment: NORMAL
Neisseria Gonorrhea: NEGATIVE

## 2023-04-28 ENCOUNTER — Ambulatory Visit (INDEPENDENT_AMBULATORY_CARE_PROVIDER_SITE_OTHER): Payer: Medicaid Other

## 2023-04-28 VITALS — BP 139/74 | HR 98 | Wt 393.6 lb

## 2023-04-28 DIAGNOSIS — Z3A35 35 weeks gestation of pregnancy: Secondary | ICD-10-CM

## 2023-04-28 DIAGNOSIS — O09293 Supervision of pregnancy with other poor reproductive or obstetric history, third trimester: Secondary | ICD-10-CM | POA: Diagnosis not present

## 2023-04-28 DIAGNOSIS — Z6841 Body Mass Index (BMI) 40.0 and over, adult: Secondary | ICD-10-CM

## 2023-04-28 DIAGNOSIS — O0993 Supervision of high risk pregnancy, unspecified, third trimester: Secondary | ICD-10-CM

## 2023-04-28 DIAGNOSIS — O09299 Supervision of pregnancy with other poor reproductive or obstetric history, unspecified trimester: Secondary | ICD-10-CM

## 2023-04-28 LAB — CULTURE, BETA STREP (GROUP B ONLY): Strep Gp B Culture: NEGATIVE

## 2023-04-28 NOTE — Progress Notes (Signed)
   NURSE VISIT- NST  SUBJECTIVE:  Kristina Scott is a 32 y.o. G66P2002 female at [redacted]w[redacted]d, here for a NST for pregnancy complicated by [facility] and Morbid obesity (BMI >=40).  She reports active fetal movement, contractions: none, vaginal bleeding: none, membranes: intact.   OBJECTIVE:  BP 139/74   Pulse 98   Wt (!) 393 lb 9.6 oz (178.5 kg)   LMP 08/22/2022 (Approximate)   BMI 67.56 kg/m   Appears well, no apparent distress  No results found for this or any previous visit (from the past 24 hour(s)).  NST: FHR baseline 140 bpm, Variability: moderate, Accelerations:present, Decelerations:  Absent= Cat 1/reactive Toco: none   ASSESSMENT: G3P2002 at [redacted]w[redacted]d with CHTN and Morbid obesity (BMI >=40) NST reactive  PLAN: EFM strip reviewed by Dr. Despina Hidden   Recommendations: keep next appointment as scheduled    Caralyn Guile  04/28/2023 12:13 PM

## 2023-04-29 ENCOUNTER — Encounter: Payer: Self-pay | Admitting: Cardiology

## 2023-04-29 ENCOUNTER — Ambulatory Visit: Payer: Medicaid Other | Attending: Cardiology | Admitting: Cardiology

## 2023-04-29 VITALS — BP 104/68 | HR 101 | Ht 64.0 in | Wt 392.4 lb

## 2023-04-29 DIAGNOSIS — O10919 Unspecified pre-existing hypertension complicating pregnancy, unspecified trimester: Secondary | ICD-10-CM | POA: Diagnosis not present

## 2023-04-29 DIAGNOSIS — Z3A36 36 weeks gestation of pregnancy: Secondary | ICD-10-CM | POA: Diagnosis not present

## 2023-04-29 NOTE — Patient Instructions (Signed)
Medication Instructions:  Your physician recommends that you continue on your current medications as directed. Please refer to the Current Medication list given to you today.  *If you need a refill on your cardiac medications before your next appointment, please call your pharmacy*   Lab Work: None   Testing/Procedures: None   Follow-Up: At [facility], you and your health needs are our priority.  As part of our continuing mission to provide you with exceptional heart care, we have created designated Provider Care Teams.  These Care Teams include your primary Cardiologist (physician) and Advanced Practice Providers (APPs -  Physician Assistants and Nurse Practitioners) who all work together to provide you with the care you need, when you need it.   Your next appointment:    Oct 25th at 1:20pm [facility] [address] [place], [place] [address]   Provider:   Thomasene Ripple, DO

## 2023-04-30 ENCOUNTER — Other Ambulatory Visit: Payer: Self-pay | Admitting: Obstetrics & Gynecology

## 2023-04-30 DIAGNOSIS — O10919 Unspecified pre-existing hypertension complicating pregnancy, unspecified trimester: Secondary | ICD-10-CM

## 2023-04-30 DIAGNOSIS — O99213 Obesity complicating pregnancy, third trimester: Secondary | ICD-10-CM

## 2023-04-30 NOTE — Progress Notes (Signed)
Cardio-Obstetrics Clinic  Follow Up Note   Date:  04/30/2023   ID:  CLARECE DRZEWIECKI, DOB [birth year], MRN [id number]  PCP:  Patient, No Pcp Per   [facility] Providers Cardiologist:  Thomasene Ripple, DO  Electrophysiologist:  None        Referring MD: No ref. provider found   Chief Complaint: " I am doing well"  History of Present Illness:    Kristina Scott is a 32 y.o. female [G3P2002] who returns for follow up of chronic hypertension in pregnancy.   Medica hx include hypertension. She is currently [redacted] weeks pregnant and is being managed on labetalol 800mg  three times a day and nifedipine 90mg  in the morning and 30mg  at bedtime. She reports that this regimen has been effective in controlling her blood pressure. She has been monitoring her blood pressure at home and reports that the readings have been consistent with those taken at the doctor's office. She is scheduled to be induced at 37 weeks. The patient also reports that the baby is active and moving a lot.   Prior CV Studies Reviewed: The following studies were reviewed today: Echo reviewed   Past Medical History:  Diagnosis Date   Contraceptive management 12/28/2013   Encounter for Nexplanon removal 12/28/2013   High blood pressure affecting pregnancy in third trimester, delivered    Hypertension    Obesity    Pregnant state, incidental 08/23/2014    Past Surgical History:  Procedure Laterality Date   WISDOM TOOTH EXTRACTION        OB History     Gravida  3   Para  2   Term  2   Preterm      AB      Living  2      SAB      IAB      Ectopic      Multiple  0   Live Births  2               Current Medications: Current Meds  Medication Sig   acetaminophen (TYLENOL) 325 MG tablet Take 650 mg by mouth every 6 (six) hours as needed for headache.   aspirin EC 81 MG tablet Take 2 daily   labetalol (NORMODYNE) 200 MG tablet Take 4 tablets (800 mg total) by mouth 3 (three) times  daily.   NIFEdipine (ADALAT CC) 30 MG 24 hr tablet Take 1 tablet (30 mg total) by mouth at bedtime.   NIFEdipine (ADALAT CC) 90 MG 24 hr tablet Take 1 tablet (90 mg total) by mouth daily.   prenatal vitamin w/FE, FA (PRENATAL 1 + 1) 27-1 MG TABS tablet Take 1 tablet by mouth daily at 12 noon.   valACYclovir (VALTREX) 500 MG tablet Take 1 tablet (500 mg total) by mouth 2 (two) times daily.     Allergies:   Patient has no known allergies.   Social History   Socioeconomic History   Marital status: Married    Spouse name: Not on file   Number of children: Not on file   Years of education: Not on file   Highest education level: Not on file  Occupational History   Not on file  Tobacco Use   Smoking status: Former    Current packs/day: 0.00    Types: Cigarettes    Quit date: 03/07/2008    Years since quitting: 15.1   Smokeless tobacco: Never  Vaping Use   Vaping status: Never Used  Substance  and Sexual Activity   Alcohol use: No    Alcohol/week: 0.0 standard drinks of alcohol   Drug use: No   Sexual activity: Yes    Birth control/protection: None  Other Topics Concern   Not on file  Social History Narrative   Not on file   Social Determinants of Health   Financial Resource Strain: Low Risk  (10/16/2022)   Overall Financial Resource Strain (CARDIA)    Difficulty of Paying Living Expenses: Not hard at all  Food Insecurity: No Food Insecurity (10/16/2022)   Hunger Vital Sign    Worried About Running Out of Food in the Last Year: Never true    Ran Out of Food in the Last Year: Never true  Transportation Needs: No Transportation Needs (10/16/2022)   PRAPARE - Administrator, Civil Service (Medical): No    Lack of Transportation (Non-Medical): No  Physical Activity: Insufficiently Active (10/16/2022)   Exercise Vital Sign    Days of Exercise per Week: 3 days    Minutes of Exercise per Session: 20 min  Stress: No Stress Concern Present (10/16/2022)   [employer]  of Occupational Health - Occupational Stress Questionnaire    Feeling of Stress : Not at all  Social Connections: Moderately Integrated (10/16/2022)   Social Connection and Isolation Panel [NHANES]    Frequency of Communication with Friends and Family: More than three times a week    Frequency of Social Gatherings with Friends and Family: Twice a week    Attends Religious Services: 1 to 4 times per year    Active Member of [employer] or Organizations: No    Attends Banker Meetings: Never    Marital Status: Married      Family History  Problem Relation Age of Onset   Stroke Maternal Grandfather       ROS:   Please see the history of present illness.     All other systems reviewed and are negative.   Labs/EKG Reviewed:    EKG:   EKG was not ordered today.    Recent Labs: 11/24/2022: ALT 35; BUN 7; Creatinine, Ser 0.65; Potassium 4.2; Sodium 135 03/20/2023: Hemoglobin 10.3; Platelets 292   Recent Lipid Panel No results found for: "CHOL", "TRIG", "HDL", "CHOLHDL", "LDLCALC", "LDLDIRECT"  Physical Exam:    VS:  BP 104/68 (BP Location: Left Arm, Patient Position: Sitting, Cuff Size: Large)   Pulse (!) 101   Ht 5\' 4"  (1.626 m)   Wt (!) 392 lb 6.4 oz (178 kg)   LMP 08/22/2022 (Approximate)   SpO2 93%   BMI 67.36 kg/m     Wt Readings from Last 3 Encounters:  04/29/23 (!) 392 lb 6.4 oz (178 kg)  04/28/23 (!) 393 lb 9.6 oz (178.5 kg)  04/24/23 (!) 393 lb 12.8 oz (178.6 kg)     GEN:  Well nourished, well developed in no acute distress HEENT: Normal NECK: No JVD; No carotid bruits LYMPHATICS: No lymphadenopathy CARDIAC: RRR, no murmurs, rubs, gallops RESPIRATORY:  Clear to auscultation without rales, wheezing or rhonchi  ABDOMEN: Soft, non-tender, non-distended MUSCULOSKELETAL:  No edema; No deformity  SKIN: Warm and dry NEUROLOGIC:  Alert and oriented x 3 PSYCHIATRIC:  Normal affect    Risk Assessment/Risk Calculators:     CARPREG II Risk Prediction Index  Score:  1.  The patient's risk for a primary cardiac event is 5%.            ASSESSMENT & PLAN:    Pregnancy  with Hypertension Blood pressure well controlled on Labetalol 800mg  TID and Nifedipine 90mg  in the morning and 30mg  at bedtime. Patient is [redacted] weeks pregnant and will be induced at 37 weeks. Continue current medication regimen. Induction of labor planned for 05/08/2023. Follow-up appointment scheduled for 05/15/2023 at [facility].   Patient Instructions  Medication Instructions:  Your physician recommends that you continue on your current medications as directed. Please refer to the Current Medication list given to you today.  *If you need a refill on your cardiac medications before your next appointment, please call your pharmacy*   Lab Work: None   Testing/Procedures: None   Follow-Up: At [facility], you and your health needs are our priority.  As part of our continuing mission to provide you with exceptional heart care, we have created designated Provider Care Teams.  These Care Teams include your primary Cardiologist (physician) and Advanced Practice Providers (APPs -  Physician Assistants and Nurse Practitioners) who all work together to provide you with the care you need, when you need it.   Your next appointment:    Oct 25th at 1:20pm [facility] Women [address] [place], [place] [address]   Provider:   Thomasene Ripple, DO      Dispo:  No follow-ups on file.   Medication Adjustments/Labs and Tests Ordered: Current medicines are reviewed at length with the patient today.  Concerns regarding medicines are outlined above.  Tests Ordered: No orders of the defined types were placed in this encounter.  Medication Changes: No orders of the defined types were placed in this encounter.

## 2023-05-01 ENCOUNTER — Encounter: Payer: Self-pay | Admitting: Obstetrics & Gynecology

## 2023-05-01 ENCOUNTER — Ambulatory Visit: Payer: Medicaid Other | Admitting: Obstetrics & Gynecology

## 2023-05-01 ENCOUNTER — Ambulatory Visit: Payer: Medicaid Other

## 2023-05-01 VITALS — BP 130/79 | HR 92 | Wt 393.8 lb

## 2023-05-01 DIAGNOSIS — O10913 Unspecified pre-existing hypertension complicating pregnancy, third trimester: Secondary | ICD-10-CM | POA: Diagnosis not present

## 2023-05-01 DIAGNOSIS — O99213 Obesity complicating pregnancy, third trimester: Secondary | ICD-10-CM

## 2023-05-01 DIAGNOSIS — O0993 Supervision of high risk pregnancy, unspecified, third trimester: Secondary | ICD-10-CM

## 2023-05-01 DIAGNOSIS — Z3A36 36 weeks gestation of pregnancy: Secondary | ICD-10-CM | POA: Diagnosis not present

## 2023-05-01 DIAGNOSIS — O09293 Supervision of pregnancy with other poor reproductive or obstetric history, third trimester: Secondary | ICD-10-CM

## 2023-05-01 DIAGNOSIS — A609 Anogenital herpesviral infection, unspecified: Secondary | ICD-10-CM

## 2023-05-01 DIAGNOSIS — O10919 Unspecified pre-existing hypertension complicating pregnancy, unspecified trimester: Secondary | ICD-10-CM

## 2023-05-01 DIAGNOSIS — O0992 Supervision of high risk pregnancy, unspecified, second trimester: Secondary | ICD-10-CM

## 2023-05-01 DIAGNOSIS — O09299 Supervision of pregnancy with other poor reproductive or obstetric history, unspecified trimester: Secondary | ICD-10-CM

## 2023-05-01 DIAGNOSIS — Z6841 Body Mass Index (BMI) 40.0 and over, adult: Secondary | ICD-10-CM

## 2023-05-01 MED ORDER — LABETALOL HCL 200 MG PO TABS: 800.0000 mg | ORAL_TABLET | Freq: Three times a day (TID) | ORAL | 3 refills | Status: DC

## 2023-05-01 NOTE — Progress Notes (Signed)
[place] 36 wks,cephalic,BPP 8/8,FHR 142 bpm,posterior placenta gr 3,EFW 2546 g 24%,RI .67,.61,.69=84%,AFI 15 cm

## 2023-05-01 NOTE — Progress Notes (Signed)
HIGH-RISK PREGNANCY VISIT Patient name: Kristina Scott MRN [id number]  Date of birth: [birth year] Chief Complaint:   Routine Prenatal Visit  History of Present Illness:   Kristina Scott is a 32 y.o. G74P2002 female at [redacted]w[redacted]d with an Estimated Date of Delivery: 05/29/23 being seen today for ongoing management of a high-risk pregnancy complicated by:  -Chronic HTN- followed by cardiology Currently on Nifedipine 90am/30pm and 800mg  TID -morbid obeisty  -HSV2- on valtrex -morbid obesity -Nexplanon in place  Today she reports no complaints.   Contractions: Not present. Vag. Bleeding: None.  Movement: Present. denies leaking of fluid.      03/20/2023    9:08 AM 11/24/2022   11:17 AM 10/16/2022    1:57 PM  Depression screen PHQ 2/9  Decreased Interest 1 1 0  Down, Depressed, Hopeless 0 0 0  PHQ - 2 Score 1 1 0  Altered sleeping 0 0 0  Tired, decreased energy 2 2 2   Change in appetite 0 1 1  Feeling bad or failure about yourself  0 0 0  Trouble concentrating 0 0 0  Moving slowly or fidgety/restless 0 0 0  Suicidal thoughts 0 0 0  PHQ-9 Score 3 4 3      Current Outpatient Medications  Medication Instructions   acetaminophen (TYLENOL) 650 mg, Oral, Every 6 hours PRN   aspirin EC 81 MG tablet Take 2 daily   furosemide (LASIX) 40 mg, Oral, Daily, For 2 days.   labetalol (NORMODYNE) 800 mg, Oral, 3 times daily   NIFEdipine (ADALAT CC) 90 mg, Oral, Daily   NIFEdipine (ADALAT CC) 30 mg, Oral, Nightly   prenatal vitamin w/FE, FA (PRENATAL 1 + 1) 27-1 MG TABS tablet 1 tablet, Oral, Daily   valACYclovir (VALTREX) 500 mg, Oral, 2 times daily     Review of Systems:   Pertinent items are noted in HPI Denies abnormal vaginal discharge w/ itching/odor/irritation, headaches, visual changes, shortness of breath, chest pain, abdominal pain, severe nausea/vomiting, or problems with urination or bowel movements unless otherwise stated above. Pertinent History Reviewed:  Reviewed past  medical,surgical, social, obstetrical and family history.  Reviewed problem list, medications and allergies. Physical Assessment:   Vitals:   05/01/23 1048  BP: 130/79  Pulse: 92  Weight: (!) 393 lb 12.8 oz (178.6 kg)  Body mass index is 67.6 kg/m.           Physical Examination:   General appearance: alert, well appearing, and in no distress  Mental status: normal mood, behavior, speech, dress, motor activity, and thought processes  Skin: warm & dry   Extremities:   left arm with barely palpable Nexplanon   Cardiovascular: normal heart rate noted  Respiratory: normal respiratory effort, no distress  Abdomen: gravid, soft, non-tender  Pelvic: Cervical exam deferred         Fetal Status:     Movement: Present    Fetal Surveillance Testing today: cephalic,BPP 8/8,FHR 142 bpm,posterior placenta gr 3,EFW 2546 g 24%,RI .67,.61,.69=84%,AFI 15 cm    Chaperone: N/A    No results found for this or any previous visit (from the past 24 hour(s)).   Assessment & Plan:  High-risk pregnancy: G3P2002 at [redacted]w[redacted]d with an Estimated Date of Delivery: 05/29/23   -Chronic HTN- followed by cardiology Currently on Nifedipine 90am/30pm and 800mg  TID BPP 8/8, continue antepartum testing IOL scheduled 10/18  -HSV2- on valtrex asymptomatic -morbid obesity -Nexplanon in place  Meds:  Meds ordered this encounter  Medications   labetalol (NORMODYNE)  200 MG tablet    Sig: Take 4 tablets (800 mg total) by mouth 3 (three) times daily.    Dispense:  360 tablet    Refill:  3    Labs/procedures today: BPP  Treatment Plan:  as outlined above  Reviewed: Term labor symptoms and general obstetric precautions including but not limited to vaginal bleeding, contractions, leaking of fluid and fetal movement were reviewed in detail with the patient.  All questions were answered. PT has home bp cuff. Check bp weekly, let us know if >160/110.   Follow-up: Return for as scheduled IOL 10/18.   Future  Appointments  Date Time Provider Department Center  05/05/2023  9:30 AM [facility] NURSE [facility]  05/08/2023  6:30 AM [facility]-LD SCHED ROOM [facility]-INDC None  05/15/2023  1:20 PM Tobb, Kardie, DO [facility] None    No orders of the defined types were placed in this encounter.   Myna Hidalgo, DO Attending Obstetrician & Gynecologist, [facility] for [employer], [facility]

## 2023-05-04 ENCOUNTER — Encounter (HOSPITAL_COMMUNITY): Payer: Self-pay | Admitting: *Deleted

## 2023-05-04 ENCOUNTER — Telehealth (HOSPITAL_COMMUNITY): Payer: Self-pay | Admitting: *Deleted

## 2023-05-04 NOTE — Telephone Encounter (Signed)
Preadmission screen  

## 2023-05-05 ENCOUNTER — Ambulatory Visit (INDEPENDENT_AMBULATORY_CARE_PROVIDER_SITE_OTHER): Payer: Medicaid Other | Admitting: *Deleted

## 2023-05-05 VITALS — BP 136/83 | HR 88 | Wt 384.0 lb

## 2023-05-05 DIAGNOSIS — Z6841 Body Mass Index (BMI) 40.0 and over, adult: Secondary | ICD-10-CM

## 2023-05-05 DIAGNOSIS — I10 Essential (primary) hypertension: Secondary | ICD-10-CM

## 2023-05-05 DIAGNOSIS — Z3A36 36 weeks gestation of pregnancy: Secondary | ICD-10-CM | POA: Diagnosis not present

## 2023-05-05 DIAGNOSIS — O0993 Supervision of high risk pregnancy, unspecified, third trimester: Secondary | ICD-10-CM

## 2023-05-05 NOTE — Progress Notes (Signed)
   NURSE VISIT- NST  SUBJECTIVE:  Kristina Scott is a 32 y.o. G35P2002 female at [redacted]w[redacted]d, here for a NST for pregnancy complicated by [facility] and Morbid obesity (BMI >=40).  She reports active fetal movement, contractions: none, vaginal bleeding: none, membranes: intact.   OBJECTIVE:  BP 136/83   Pulse 88   Wt (!) 384 lb (174.2 kg)   LMP 08/22/2022 (Approximate)   BMI 65.91 kg/m   Appears well, no apparent distress  No results found for this or any previous visit (from the past 24 hour(s)).  NST: FHR baseline 140 bpm, Variability: moderate, Accelerations:present, Decelerations:  Absent Intermittent tracing obtained due to active fetal movement. Active fetal movement reported by patient along with audible movement on monitor. Toco: none   ASSESSMENT: G3P2002 at [redacted]w[redacted]d with CHTN and Morbid obesity (BMI >=40) NST   PLAN: EFM strip reviewed by Dr. Despina Hidden   Recommendations: keep next appointment as scheduled    [name]  05/05/2023 11:19 AM

## 2023-05-08 ENCOUNTER — Encounter: Payer: Medicaid Other | Admitting: Obstetrics & Gynecology

## 2023-05-08 ENCOUNTER — Inpatient Hospital Stay (HOSPITAL_COMMUNITY): Payer: Medicaid Other

## 2023-05-08 ENCOUNTER — Encounter (HOSPITAL_COMMUNITY): Payer: Self-pay | Admitting: Obstetrics & Gynecology

## 2023-05-08 ENCOUNTER — Inpatient Hospital Stay (HOSPITAL_COMMUNITY)
Admission: AD | Admit: 2023-05-08 | Discharge: 2023-05-10 | DRG: 806 | Disposition: A | Discharge status: Home / Self Care | Payer: Medicaid Other | Attending: Obstetrics & Gynecology | Admitting: Obstetrics & Gynecology

## 2023-05-08 ENCOUNTER — Other Ambulatory Visit: Payer: 59

## 2023-05-08 DIAGNOSIS — Z79899 Other long term (current) drug therapy: Secondary | ICD-10-CM | POA: Diagnosis not present

## 2023-05-08 DIAGNOSIS — Z23 Encounter for immunization: Secondary | ICD-10-CM | POA: Diagnosis not present

## 2023-05-08 DIAGNOSIS — O1092 Unspecified pre-existing hypertension complicating childbirth: Principal | ICD-10-CM | POA: Diagnosis present

## 2023-05-08 DIAGNOSIS — O1002 Pre-existing essential hypertension complicating childbirth: Secondary | ICD-10-CM | POA: Diagnosis not present

## 2023-05-08 DIAGNOSIS — Z3A37 37 weeks gestation of pregnancy: Secondary | ICD-10-CM

## 2023-05-08 DIAGNOSIS — Z87891 Personal history of nicotine dependence: Secondary | ICD-10-CM | POA: Diagnosis not present

## 2023-05-08 DIAGNOSIS — A6000 Herpesviral infection of urogenital system, unspecified: Secondary | ICD-10-CM | POA: Diagnosis present

## 2023-05-08 DIAGNOSIS — O0993 Supervision of high risk pregnancy, unspecified, third trimester: Secondary | ICD-10-CM

## 2023-05-08 DIAGNOSIS — O99214 Obesity complicating childbirth: Secondary | ICD-10-CM | POA: Diagnosis present

## 2023-05-08 DIAGNOSIS — O9832 Other infections with a predominantly sexual mode of transmission complicating childbirth: Secondary | ICD-10-CM | POA: Diagnosis present

## 2023-05-08 DIAGNOSIS — O10919 Unspecified pre-existing hypertension complicating pregnancy, unspecified trimester: Principal | ICD-10-CM | POA: Diagnosis present

## 2023-05-08 DIAGNOSIS — Z823 Family history of stroke: Secondary | ICD-10-CM

## 2023-05-08 LAB — CBC
HCT: 28 % — ABNORMAL LOW (ref 36.0–46.0)
HCT: 32.1 % — ABNORMAL LOW (ref 36.0–46.0)
Hemoglobin: 9.6 g/dL — ABNORMAL LOW (ref 12.0–15.0)
Hemoglobin: 10.9 g/dL — ABNORMAL LOW (ref 12.0–15.0)
MCH: 30.6 pg (ref 26.0–34.0)
MCH: 30.5 pg (ref 26.0–34.0)
MCHC: 34.3 g/dL (ref 30.0–36.0)
MCHC: 34 g/dL (ref 30.0–36.0)
MCV: 89.2 fL (ref 80.0–100.0)
MCV: 89.9 fL (ref 80.0–100.0)
Platelets: 267 10*3/uL (ref 150–400)
Platelets: 269 10*3/uL (ref 150–400)
RBC: 3.14 MIL/uL — ABNORMAL LOW (ref 3.87–5.11)
RBC: 3.57 MIL/uL — ABNORMAL LOW (ref 3.87–5.11)
RDW: 13.4 % (ref 11.5–15.5)
RDW: 13.3 % (ref 11.5–15.5)
WBC: 7.1 10*3/uL (ref 4.0–10.5)
WBC: 11.3 10*3/uL — ABNORMAL HIGH (ref 4.0–10.5)
nRBC: 0 % (ref 0.0–0.2)
nRBC: 0 % (ref 0.0–0.2)

## 2023-05-08 LAB — COMPREHENSIVE METABOLIC PANEL
ALT: 18 U/L (ref 0–44)
AST: 23 U/L (ref 15–41)
Albumin: 2.6 g/dL — ABNORMAL LOW (ref 3.5–5.0)
Alkaline Phosphatase: 248 U/L — ABNORMAL HIGH (ref 38–126)
Anion gap: 10 (ref 5–15)
BUN: 5 mg/dL — ABNORMAL LOW (ref 6–20)
CO2: 20 mmol/L — ABNORMAL LOW (ref 22–32)
Calcium: 9.4 mg/dL (ref 8.9–10.3)
Chloride: 105 mmol/L (ref 98–111)
Creatinine, Ser: 0.6 mg/dL (ref 0.44–1.00)
GFR, Estimated: >60 mL/min (ref >60)
Glucose, Bld: 89 mg/dL (ref 70–99)
Potassium: 3.8 mmol/L (ref 3.5–5.1)
Sodium: 135 mmol/L (ref 135–145)
Total Bilirubin: 0.5 mg/dL (ref 0.3–1.2)
Total Protein: 6.6 g/dL (ref 6.5–8.1)

## 2023-05-08 LAB — TYPE AND SCREEN
ABO/RH(D): AB POS
Antibody Screen: NEGATIVE

## 2023-05-08 LAB — RPR: RPR Ser Ql: NONREACTIVE

## 2023-05-08 MED ORDER — DIPHENHYDRAMINE HCL 50 MG/ML IJ SOLN: 12.5000 mg | INTRAMUSCULAR | Status: DC | PRN

## 2023-05-08 MED ORDER — PHENYLEPHRINE 80 MCG/ML (10ML) SYRINGE FOR IV PUSH (FOR BLOOD PRESSURE SUPPORT): 80.0000 ug | PREFILLED_SYRINGE | INTRAVENOUS | Status: DC | PRN

## 2023-05-08 MED ORDER — LABETALOL HCL 200 MG PO TABS
800.0000 mg | ORAL_TABLET | Freq: Three times a day (TID) | ORAL | Status: DC
  Administered 2023-05-08 (×2): 800 mg via ORAL
  Filled 2023-05-08 (×2): qty 4

## 2023-05-08 MED ORDER — LACTATED RINGERS AMNIOINFUSION: INTRAVENOUS | Status: DC

## 2023-05-08 MED ORDER — OXYTOCIN-SODIUM CHLORIDE 30-0.9 UT/500ML-% IV SOLN
1.0000 m[IU]/min | INTRAVENOUS | Status: DC
  Administered 2023-05-08: 2 m[IU]/min via INTRAVENOUS
  Filled 2023-05-08: qty 500

## 2023-05-08 MED ORDER — NIFEDIPINE ER OSMOTIC RELEASE 30 MG PO TB24
30.0000 mg | ORAL_TABLET | Freq: Every day | ORAL | Status: DC
  Administered 2023-05-08: 30 mg via ORAL
  Filled 2023-05-08: qty 1

## 2023-05-08 MED ORDER — OXYCODONE-ACETAMINOPHEN 5-325 MG PO TABS: 2.0000 | ORAL_TABLET | ORAL | Status: DC | PRN

## 2023-05-08 MED ORDER — VALACYCLOVIR HCL 500 MG PO TABS
500.0000 mg | ORAL_TABLET | Freq: Two times a day (BID) | ORAL | Status: DC
  Administered 2023-05-08: 500 mg via ORAL
  Filled 2023-05-08: qty 1

## 2023-05-08 MED ORDER — EPHEDRINE 5 MG/ML INJ: 10.0000 mg | INTRAVENOUS | Status: DC | PRN

## 2023-05-08 MED ORDER — TERBUTALINE SULFATE 1 MG/ML IJ SOLN
0.2500 mg | Freq: Once | INTRAMUSCULAR | Status: AC | PRN
  Administered 2023-05-08: 0.25 mg via SUBCUTANEOUS
  Filled 2023-05-08: qty 1

## 2023-05-08 MED ORDER — NIFEDIPINE ER OSMOTIC RELEASE 30 MG PO TB24: 90.0000 mg | ORAL_TABLET | Freq: Every morning | ORAL | Status: DC

## 2023-05-08 MED ORDER — FENTANYL CITRATE (PF) 100 MCG/2ML IJ SOLN
50.0000 ug | INTRAMUSCULAR | Status: DC | PRN
  Administered 2023-05-08: 100 ug via INTRAVENOUS
  Filled 2023-05-08: qty 2

## 2023-05-08 MED ORDER — OXYTOCIN BOLUS FROM INFUSION
333.0000 mL | Freq: Once | INTRAVENOUS | Status: AC
  Administered 2023-05-08: 333 mL via INTRAVENOUS

## 2023-05-08 MED ORDER — FENTANYL-BUPIVACAINE-NACL 0.5-0.125-0.9 MG/250ML-% EP SOLN
12.0000 mL/h | EPIDURAL | Status: DC | PRN
  Filled 2023-05-08: qty 250

## 2023-05-08 MED ORDER — SOD CITRATE-CITRIC ACID 500-334 MG/5ML PO SOLN: 30.0000 mL | ORAL | Status: DC | PRN

## 2023-05-08 MED ORDER — LIDOCAINE HCL (PF) 1 % IJ SOLN: 30.0000 mL | INTRAMUSCULAR | Status: DC | PRN

## 2023-05-08 MED ORDER — OXYCODONE-ACETAMINOPHEN 5-325 MG PO TABS: 1.0000 | ORAL_TABLET | ORAL | Status: DC | PRN

## 2023-05-08 MED ORDER — ONDANSETRON HCL 4 MG/2ML IJ SOLN: 4.0000 mg | Freq: Four times a day (QID) | INTRAMUSCULAR | Status: DC | PRN

## 2023-05-08 MED ORDER — OXYTOCIN-SODIUM CHLORIDE 30-0.9 UT/500ML-% IV SOLN
2.5000 [IU]/h | INTRAVENOUS | Status: DC
  Administered 2023-05-08: 2.5 [IU]/h via INTRAVENOUS

## 2023-05-08 MED ORDER — LACTATED RINGERS IV SOLN: 500.0000 mL | INTRAVENOUS | Status: DC | PRN

## 2023-05-08 MED ORDER — ACETAMINOPHEN 325 MG PO TABS: 650.0000 mg | ORAL_TABLET | ORAL | Status: DC | PRN

## 2023-05-08 MED ORDER — LACTATED RINGERS IV SOLN: INTRAVENOUS | Status: DC

## 2023-05-08 NOTE — Plan of Care (Signed)
  Problem: Education: Goal: Knowledge of Childbirth will improve 05/08/2023 2243 by Joretta Bachelor, RN Outcome: Completed/Met 05/08/2023 2242 by Joretta Bachelor, RN Outcome: Progressing 05/08/2023 2002 by Joretta Bachelor, RN Outcome: Progressing Goal: Ability to make informed decisions regarding treatment and plan of care will improve 05/08/2023 2243 by Joretta Bachelor, RN Outcome: Completed/Met 05/08/2023 2242 by Joretta Bachelor, RN Outcome: Progressing 05/08/2023 2002 by Joretta Bachelor, RN Outcome: Progressing Goal: Ability to state and carry out methods to decrease the pain will improve 05/08/2023 2243 by Joretta Bachelor, RN Outcome: Completed/Met 05/08/2023 2242 by Joretta Bachelor, RN Outcome: Progressing 05/08/2023 2002 by Joretta Bachelor, RN Outcome: Progressing Goal: Individualized Educational Video(s) 05/08/2023 2243 by Joretta Bachelor, RN Outcome: Completed/Met 05/08/2023 2242 by Joretta Bachelor, RN Outcome: Progressing 05/08/2023 2002 by Joretta Bachelor, RN Outcome: Progressing   Problem: Coping: Goal: Ability to verbalize concerns and feelings about labor and delivery will improve 05/08/2023 2243 by Joretta Bachelor, RN Outcome: Completed/Met 05/08/2023 2242 by Joretta Bachelor, RN Outcome: Progressing 05/08/2023 2002 by Joretta Bachelor, RN Outcome: Progressing   Problem: Life Cycle: Goal: Ability to make normal progression through stages of labor will improve 05/08/2023 2243 by Joretta Bachelor, RN Outcome: Completed/Met 05/08/2023 2242 by Joretta Bachelor, RN Outcome: Progressing 05/08/2023 2002 by Joretta Bachelor, RN Outcome: Progressing Goal: Ability to effectively push during vaginal delivery will improve 05/08/2023 2243 by Joretta Bachelor, RN Outcome: Completed/Met 05/08/2023 2242 by Joretta Bachelor, RN Outcome: Progressing 05/08/2023 2002 by Joretta Bachelor, RN Outcome: Progressing   Problem: Role Relationship: Goal: Will  demonstrate positive interactions with the child 05/08/2023 2243 by Joretta Bachelor, RN Outcome: Completed/Met 05/08/2023 2242 by Joretta Bachelor, RN Outcome: Progressing 05/08/2023 2002 by Joretta Bachelor, RN Outcome: Progressing   Problem: Safety: Goal: Risk of complications during labor and delivery will decrease 05/08/2023 2243 by Joretta Bachelor, RN Outcome: Completed/Met 05/08/2023 2242 by Joretta Bachelor, RN Outcome: Progressing 05/08/2023 2002 by Joretta Bachelor, RN Outcome: Progressing   Problem: Pain Management: Goal: Relief or control of pain from uterine contractions will improve 05/08/2023 2243 by Joretta Bachelor, RN Outcome: Completed/Met 05/08/2023 2242 by Joretta Bachelor, RN Outcome: Progressing 05/08/2023 2002 by Joretta Bachelor, RN Outcome: Progressing

## 2023-05-08 NOTE — Discharge Summary (Signed)
Postpartum Discharge Summary  Date of Service updated-10/20     Patient Name: Kristina Scott DOB: Jul 11, 1991 MRN: [id number]  Date of admission: 05/08/2023 Delivery date:05/08/2023 Delivering provider: [name] Date of discharge: 05/10/2023  Admitting diagnosis: Chronic hypertension affecting pregnancy [O10.919] Intrauterine pregnancy: [redacted]w[redacted]d     Secondary diagnosis:  Principal Problem:   Chronic hypertension affecting pregnancy Active Problems:   NSVD (normal spontaneous vaginal delivery)  Additional problems: morbid obesity    Discharge diagnosis: Term Pregnancy Delivered and CHTN                                              Post partum procedures: none Augmentation: AROM and Pitocin Complications: None  Hospital course: Induction of Labor With Vaginal Delivery   32 y.o. yo G3P2002 at [redacted]w[redacted]d was admitted to the hospital 05/08/2023 for induction of labor.  Indication for induction:  Severe chronic hypertension .  Patient had an labor course complicated by rapid progression from 5 to 10 with progressively worsening recurrent variable decels. Amnioinfusion attempted but progress to quickly. Membrane Rupture Time/Date: 4:30 PM,05/08/2023  Delivery Method:Vaginal, Spontaneous Operative Delivery:N/A Episiotomy: None Lacerations:  None Details of delivery can be found in separate delivery note.  Patient had a postpartum course complicated by chronic HTN- pt continued on home medications and Lasix x 7 days. Patient is discharged home 05/10/23.  Newborn Data: Birth date:05/08/2023 Birth time:9:48 PM Gender:Female Living status:Living Apgars:9 ,9  Weight:2560 g  Magnesium Sulfate received: No BMZ received: No Rhophylac:N/A MMR:Yes T-DaP:Given prenatally Flu: No RSV Vaccine received: No Transfusion:No  Immunizations received: Immunization History  Administered Date(s) Administered   Influenza,inj,Quad PF,6+ Mos 09/11/2014   MMR 03/08/2015   Tdap 03/08/2015,  04/03/2023    Physical exam  Vitals:   05/09/23 1300 05/09/23 1813 05/09/23 2325 05/10/23 0546  BP: (!) 142/86 122/82 (!) 151/92 138/78  Pulse: 83 78 79 72  Resp: 18  19 19   Temp: 98.2 F (36.8 C)  97.7 F (36.5 C) 98 F (36.7 C)  TempSrc: Oral  Oral Oral  SpO2: 97%  100% 99%  Weight:      Height:       General: alert, cooperative, and no distress Lochia: appropriate Uterine Fundus: firm Incision: N/A DVT Evaluation: No evidence of DVT seen on physical exam. Labs: Lab Results  Component Value Date   WBC 12.7 (H) 05/09/2023   HGB 10.8 (L) 05/09/2023   HCT 31.3 (L) 05/09/2023   MCV 89.2 05/09/2023   PLT 271 05/09/2023      Latest Ref Rng & Units 05/08/2023   10:33 AM  CMP  Glucose 70 - 99 mg/dL 89   BUN 6 - 20 mg/dL 5   Creatinine 4.09 - 8.11 mg/dL 9.14   Sodium 782 - 956 mmol/L 135   Potassium 3.5 - 5.1 mmol/L 3.8   Chloride 98 - 111 mmol/L 105   CO2 22 - 32 mmol/L 20   Calcium 8.9 - 10.3 mg/dL 9.4   Total Protein 6.5 - 8.1 g/dL 6.6   Total Bilirubin 0.3 - 1.2 mg/dL 0.5   Alkaline Phos 38 - 126 U/L 248   AST 15 - 41 U/L 23   ALT 0 - 44 U/L 18    Edinburgh Score:    05/09/2023    1:32 PM  Edinburgh Postnatal Depression Scale Screening Tool  I have been  able to laugh and see the funny side of things. 0  I have looked forward with enjoyment to things. 0  I have blamed myself unnecessarily when things went wrong. 0  I have been anxious or worried for no good reason. 0  I have felt scared or panicky for no good reason. 0  Things have been getting on top of me. 0  I have been so unhappy that I have had difficulty sleeping. 0  I have felt sad or miserable. 0  I have been so unhappy that I have been crying. 0  The thought of harming myself has occurred to me. 0  Edinburgh Postnatal Depression Scale Total 0   Edinburgh Postnatal Depression Scale Total: 0   After visit meds:  Allergies as of 05/10/2023   No Known Allergies      Medication List      STOP taking these medications    aspirin EC 81 MG tablet   valACYclovir 500 MG tablet Commonly known as: Valtrex       TAKE these medications    acetaminophen 325 MG tablet Commonly known as: Tylenol Take 2 tablets (650 mg total) by mouth every 6 (six) hours as needed (for pain scale < 4). What changed: reasons to take this   furosemide 20 MG tablet Commonly known as: LASIX Take 1 tablet (20 mg total) by mouth daily for 7 days. What changed:  medication strength how much to take additional instructions   ibuprofen 600 MG tablet Commonly known as: ADVIL Take 1 tablet (600 mg total) by mouth every 6 (six) hours.   labetalol 200 MG tablet Commonly known as: NORMODYNE Take 4 tablets (800 mg total) by mouth 3 (three) times daily.   NIFEdipine 90 MG 24 hr tablet Commonly known as: ADALAT CC Take 1 tablet (90 mg total) by mouth daily.   NIFEdipine 30 MG 24 hr tablet Commonly known as: ADALAT CC Take 1 tablet (30 mg total) by mouth at bedtime.   prenatal vitamin w/FE, FA 27-1 MG Tabs tablet Take 1 tablet by mouth daily at 12 noon.         Discharge home in stable condition Infant Feeding: Bottle Infant Disposition:home with mother Discharge instruction: per After Visit Summary and Postpartum booklet. Activity: Advance as tolerated. Pelvic rest for 6 weeks.  Diet: routine diet Future Appointments: Future Appointments  Date Time Provider Department Center  05/15/2023  1:20 PM Tobb, Lavona Mound, DO [facility] None   Follow up Visit:  Follow-up Information     [facility] for [facility] at [facility]. Go to.   Specialty: Obstetrics and Gynecology Why: Please follow up in one week for a BP check Contact information: [address] [place] [address] [phone]              Message sent to [facility] 10/18   Please schedule this patient for a In person postpartum visit in 6 weeks with the following provider: Any  provider. Additional Postpartum F/U:Postpartum Depression checkup and BP check 1 week  High risk pregnancy complicated by: HTN Delivery mode:  Vaginal, Spontaneous Anticipated Birth Control:  BTL done PP   05/10/2023 Sharon Seller, DO

## 2023-05-08 NOTE — H&P (Signed)
OBSTETRIC ADMISSION HISTORY AND PHYSICAL  Kristina Scott is a 32 y.o. female G3P2002 with IUP at 100w0d by LMP presenting for [facility]. She reports +FMs, No LOF, no VB, no blurry vision, headaches or peripheral edema, and RUQ pain.  She plans on bottle feeding. She request PP BTL (03/25/2023) for birth control. She received her prenatal care at [facility]   Dating: By LMP --->  Estimated Date of Delivery: 05/29/23  Sono:  Normal fluid, normal fetal assessment with BPP 8/8, normal doppler  @[redacted]w[redacted]d , CWD, normal anatomy, cephalic presentation, posterior placental lie, 2546g, 24% EFW   Prenatal History/Complications:  Patient Active Problem List   Diagnosis Date Noted   Chronic hypertension affecting pregnancy 05/08/2023   History of pre-eclampsia in prior pregnancy, currently pregnant 11/24/2022   Supervision of high-risk pregnancy 11/24/2022   Uncontrolled hypertension 10/16/2022   Nexplanon in place 10/16/2022   BMI 60.0-69.9, adult (HCC) 04/17/2015   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   NURSING  PROVIDER  Office Location [facility] Dating by LMP c/w U/S at 10 wks  [facility] Model Traditional Anatomy U/S    Initiated care at  [employer]  English               LAB RESULTS   Support Person   Genetics NIPS:  LR female           AFP:                  NT/IT (FT only)        Carrier Screen Neg SMA, CF and [place]  Rhogam  AB/Positive/-- (05/06 1435)N/a A1C/GTT Early:  5.5           Third trimester:  Flu Vaccine        TDaP Vaccine  9/13 Blood Type AB/Positive/-- (05/06 1435)  Covid Vaccine   Antibody Negative (08/30 0843)      Rubella 1.81 (05/06 1435)  Feeding Plan Bottle  RPR Non Reactive (08/30 0843)  Contraception Discussed BTL HBsAg Negative (05/06 1435)  Circumcision NA HIV Non Reactive (08/30 0843)  Pediatrician  [place] PED[facility] HCVAb Non Reactive (05/06 1435)  Prenatal Classes discussed          Pap 11/24/22: neg  BTLConsent  03/25/2023 GC/CT Initial:   -/-          36wks:   VBAC  Consent N/a GBS  neg For PCN allergy, check sensitivities            DME Rx [ x] BP cuff [ ]  Weight Scale Waterbirth  [ ]  Class [ ]  Consent [ ]  CNM visit  PHQ9 & GAD7 [ x ] new OB [  ] 28 weeks  [  ] 36 weeks Induction  [ ]  Orders Entered [ ] Foley Y/N     Past Medical History: Past Medical History:  Diagnosis Date   Contraceptive management 12/28/2013   Encounter for Nexplanon removal 12/28/2013   High blood pressure affecting pregnancy in third trimester, delivered    Hypertension    Obesity    Pregnant state, incidental 08/23/2014    Past Surgical History: Past Surgical History:  Procedure Laterality Date   WISDOM TOOTH EXTRACTION      Obstetrical History: OB History     Gravida  3   Para  2   Term  2   Preterm      AB  Living  2      SAB      IAB      Ectopic      Multiple  0   Live Births  2           Social History Social History   Socioeconomic History   Marital status: Married    Spouse name: Not on file   Number of children: Not on file   Years of education: Not on file   Highest education level: Not on file  Occupational History   Not on file  Tobacco Use   Smoking status: Former    Current packs/day: 0.00    Types: Cigarettes    Quit date: 03/07/2008    Years since quitting: 15.1   Smokeless tobacco: Never  Vaping Use   Vaping status: Never Used  Substance and Sexual Activity   Alcohol use: No    Alcohol/week: 0.0 standard drinks of alcohol   Drug use: No   Sexual activity: Yes    Birth control/protection: None  Other Topics Concern   Not on file  Social History Narrative   Not on file   Social Determinants of Health   Financial Resource Strain: Low Risk  (10/16/2022)   Overall Financial Resource Strain (CARDIA)    Difficulty of Paying Living Expenses: Not hard at all  Food Insecurity: No Food Insecurity (05/08/2023)   Hunger Vital Sign    Worried About Running Out of Food in the Last Year: Never  true    Ran Out of Food in the Last Year: Never true  Transportation Needs: No Transportation Needs (05/08/2023)   PRAPARE - Administrator, Civil Service (Medical): No    Lack of Transportation (Non-Medical): No  Physical Activity: Insufficiently Active (10/16/2022)   Exercise Vital Sign    Days of Exercise per Week: 3 days    Minutes of Exercise per Session: 20 min  Stress: No Stress Concern Present (10/16/2022)   [employer] of Occupational Health - Occupational Stress Questionnaire    Feeling of Stress : Not at all  Social Connections: Moderately Integrated (10/16/2022)   Social Connection and Isolation Panel [NHANES]    Frequency of Communication with Friends and Family: More than three times a week    Frequency of Social Gatherings with Friends and Family: Twice a week    Attends Religious Services: 1 to 4 times per year    Active Member of [employer] or Organizations: No    Attends Engineer, structural: Never    Marital Status: Married    Family History: Family History  Problem Relation Age of Onset   Stroke Maternal Grandfather     Allergies: No Known Allergies  Medications Prior to Admission  Medication Sig Dispense Refill Last Dose   acetaminophen (TYLENOL) 325 MG tablet Take 650 mg by mouth every 6 (six) hours as needed for headache.      aspirin EC 81 MG tablet Take 2 daily 60 tablet 12    furosemide (LASIX) 40 MG tablet Take 1 tablet (40 mg total) by mouth daily for 2 days. For 2 days. 2 tablet 0    labetalol (NORMODYNE) 200 MG tablet Take 4 tablets (800 mg total) by mouth 3 (three) times daily. 360 tablet 3    NIFEdipine (ADALAT CC) 30 MG 24 hr tablet Take 1 tablet (30 mg total) by mouth at bedtime. 90 tablet 3    NIFEdipine (ADALAT CC) 90 MG 24 hr tablet Take 1  tablet (90 mg total) by mouth daily. 90 tablet 3    prenatal vitamin w/FE, FA (PRENATAL 1 + 1) 27-1 MG TABS tablet Take 1 tablet by mouth daily at 12 noon. 30 tablet 12    valACYclovir  (VALTREX) 500 MG tablet Take 1 tablet (500 mg total) by mouth 2 (two) times daily. 30 tablet 0      Review of Systems   All systems reviewed and negative except as stated in HPI  Height 5\' 4"  (1.626 m), weight (!) 181.2 kg, last menstrual period 08/22/2022. General appearance: alert, cooperative, and appears stated age Lungs: clear to auscultation bilaterally Heart: regular rate and rhythm Abdomen: soft, non-tender; bowel sounds normal Pelvic: normal female genitalia  Extremities: Homans sign is negative, no sign of DVT Presentation: cephalic Fetal monitoringBaseline: 140 bpm, Variability: Good {> 6 bpm), Accelerations: Reactive, and Decelerations: Absent Uterine activity None     Prenatal labs: ABO, Rh: AB/Positive/-- (05/06 1435) Antibody: Negative (08/30 0843) Rubella: 1.81 (05/06 1435) RPR: Non Reactive (08/30 0843)  HBsAg: Negative (05/06 1435)  HIV: Non Reactive (08/30 0843)  GBS: Negative/-- (10/04 0245)  2 hr Glucola wnl Genetic screening  LR female Anatomy US normal  Prenatal Transfer Tool  Maternal Diabetes: No Genetic Screening: Normal Maternal Ultrasounds/Referrals: Normal Fetal Ultrasounds or other Referrals:  None Maternal Substance Abuse:  No Significant Maternal Medications:  Meds include: Other:  Valtrex, Nifedipine, Lasix, Labetalol Significant Maternal Lab Results:  Group B Strep negative Number of Prenatal Visits:greater than 3 verified prenatal visits Other Comments:  None  No results found for this or any previous visit (from the past 24 hour(s)).  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 G3P2002 at [redacted]w[redacted]d here for IOL  #Labor: Cytotec #Pain: Undecided at this time, discussed options #FWB: Cat 1, FHR 120, no decels #ID:  GBS neg, on valtrex for HSV #MOF: Bottle #MOC: PPTL  #Hx of PreE in prior pregnancy #cHTN:  -continue home Nifedipine 90am/30QHS and Labetalol 800 mg TID -f/u admission labs   #Nexplanon in place  #HSV2 on valtrex: never known lesions, seropositive only  No prodromal symptoms / known lesions, taking valtrex  Barrett Shell, Medical Student  05/08/2023, 9:50 AM  Evaluation and management procedures were performed by the [facility] Medicine Resident under my supervision. I was immediately available for direct supervision, assistance and direction throughout this encounter.  I also confirm that I have verified the information documented in the resident's note, and that I have also personally reperformed the pertinent components of the physical exam and all of the medical decision making activities.  I have also made any necessary editorial changes.   Mittie Bodo, MD Family Medicine - Obstetrics Fellow

## 2023-05-08 NOTE — Plan of Care (Signed)

## 2023-05-08 NOTE — Progress Notes (Signed)
Labor Progress Note Kristina Scott is a 32 y.o. G3P2002 at [redacted]w[redacted]d presented for IOL for CHTN S: coping well, called for recurrent variables to the 60s  O:  BP (!) 146/85   Pulse 77   Temp 97.8 F (36.6 C) (Oral)   Resp 20   Ht 5\' 4"  (1.626 m)   Wt (!) 181.2 kg   LMP 08/22/2022 (Approximate)   SpO2 100%   BMI 68.56 kg/m  EFM: 125/moderate variablity/accels present/no decels  CVE: Dilation: 7 Effacement (%): Thick Cervical Position: Posterior Station: -3 Presentation: Vertex Exam by:: Thomes Cake, RN   A&P: 32 y.o. [id number] [redacted]w[redacted]d admitted for IOL #Labor: Progressing well. Amnioinfusion started for variables, bolus then maintenance #Pain: epidural #FWB: Cat II, amnioinfusion started #GBS negative  #Hx of PreE in prior pregnancy #cHTN:  -continue home Nifedipine 90am/30QHS and Labetalol 800 mg TID -f/u admission labs    #Nexplanon in place   #HSV2 on valtrex: never known lesions, seropositive only  No prodromal symptoms / known lesions, taking valtrex  [name], MD 9:34 PM

## 2023-05-08 NOTE — Progress Notes (Addendum)
LABOR PROGRESS NOTE  Kristina Scott is a 32 y.o. G3P2002 at [redacted]w[redacted]d  presented for IOL for cHTN.  Subjective: Patient tolerating contractions well, pain well controlled.  FOB at bedside for support.  Denies headache, vision changes, RUQ pain.  Objective: BP 129/80   Pulse 83   Temp 98.1 F (36.7 C) (Oral)   Resp 20   Ht 5\' 4"  (1.626 m)   Wt (!) 181.2 kg   LMP 08/22/2022 (Approximate)   BMI 68.56 kg/m  or  Vitals:   05/08/23 1146 05/08/23 1204 05/08/23 1238 05/08/23 1303  BP: (!) 121/52 (!) 134/51 138/61 129/80  Pulse: 83 81 81 83  Resp: 20 (!) 22 (!) 22 20  Temp:    98.1 F (36.7 C)  TempSrc:    Oral  Weight:      Height:       Dilation: 3.5 Effacement (%): Thick Cervical Position: Posterior Station: -3 Presentation: Vertex Exam by:: Sofija Antwi  Fetal monitoring: Baseline: 145 bpm/moderate variability/no accels/occasional variable decels Uterine activity: Every 2-3 minutes  Labs: Lab Results  Component Value Date   WBC 7.1 05/08/2023   HGB 9.6 (L) 05/08/2023   HCT 28.0 (L) 05/08/2023   MCV 89.2 05/08/2023   PLT 267 05/08/2023    Patient Active Problem List   Diagnosis Date Noted   Chronic hypertension affecting pregnancy 05/08/2023   History of pre-eclampsia in prior pregnancy, currently pregnant 11/24/2022   Supervision of high-risk pregnancy 11/24/2022   Uncontrolled hypertension 10/16/2022   Nexplanon in place 10/16/2022   BMI 60.0-69.9, adult (HCC) 04/17/2015   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 G3P2002 at [redacted]w[redacted]d here for IOL for cHTN.  Labor: FSE placed by RN, IUPC placed by MD.  Gradually making some change.  Continue repositioning as needed.  Augmentation with pitocin. Fetal Wellbeing:  Cat II Pain Control:  Epidural at pt request Anticipated MOD:  Vaginal #GBS negative  #Nexplanon in place  #HSV2 on valtrex: never known lesions, seropositive only  No prodromal symptoms / known lesions, taking valtrex  Hx of PreE in  prior pregnancy #cHTN: UPC pending // 267 // 23/18 , normotensive since admission,  Asymptomatic. -Continue home Nifedipine 90am/30QHS and Labetalol 800 mg TID  Dimitry Shitarev [facility] PGY-1 05/08/23 4:47 PM  Evaluation and management procedures were performed by the [facility] Medicine Resident under my supervision. I was immediately available for direct supervision, assistance and direction throughout this encounter.  I also confirm that I have verified the information documented in the resident's note, and that I have also personally reperformed the pertinent components of the physical exam and all of the medical decision making activities.  I have also made any necessary editorial changes.   Mittie Bodo, MD Family Medicine - Obstetrics Fellow

## 2023-05-08 NOTE — Progress Notes (Signed)
IUPC fell out with position change and per nursing would like replaced as continued to have trouble with external monitoring.  IUPC placed anterior maternal left quadrant to avoid posterior placenta.  Inserted with ease, no concerns.  Ensured responding with positive cough test.  More clear fluid drained out.    Mittie Bodo, MD Family Medicine - Obstetrics Fellow

## 2023-05-09 LAB — CBC
HCT: 31.3 % — ABNORMAL LOW (ref 36.0–46.0)
Hemoglobin: 10.8 g/dL — ABNORMAL LOW (ref 12.0–15.0)
MCH: 30.8 pg (ref 26.0–34.0)
MCHC: 34.5 g/dL (ref 30.0–36.0)
MCV: 89.2 fL (ref 80.0–100.0)
Platelets: 271 10*3/uL (ref 150–400)
RBC: 3.51 MIL/uL — ABNORMAL LOW (ref 3.87–5.11)
RDW: 13.5 % (ref 11.5–15.5)
WBC: 12.7 10*3/uL — ABNORMAL HIGH (ref 4.0–10.5)
nRBC: 0 % (ref 0.0–0.2)

## 2023-05-09 MED ORDER — INFLUENZA VIRUS VACC SPLIT PF (FLUZONE) 0.5 ML IM SUSY
0.5000 mL | PREFILLED_SYRINGE | INTRAMUSCULAR | Status: AC
  Administered 2023-05-10: 0.5 mL via INTRAMUSCULAR
  Filled 2023-05-09: qty 0.5

## 2023-05-09 MED ORDER — LABETALOL HCL 200 MG PO TABS
600.0000 mg | ORAL_TABLET | Freq: Three times a day (TID) | ORAL | Status: DC
  Administered 2023-05-09 – 2023-05-10 (×4): 600 mg via ORAL
  Filled 2023-05-09 (×4): qty 3

## 2023-05-09 MED ORDER — NIFEDIPINE ER OSMOTIC RELEASE 30 MG PO TB24
90.0000 mg | ORAL_TABLET | Freq: Once | ORAL | Status: AC
  Administered 2023-05-09: 90 mg via ORAL
  Filled 2023-05-09: qty 3

## 2023-05-09 MED ORDER — TETANUS-DIPHTH-ACELL PERTUSSIS 5-2.5-18.5 LF-MCG/0.5 IM SUSY: 0.5000 mL | PREFILLED_SYRINGE | Freq: Once | INTRAMUSCULAR | Status: DC

## 2023-05-09 MED ORDER — ONDANSETRON HCL 4 MG PO TABS: 4.0000 mg | ORAL_TABLET | ORAL | Status: DC | PRN

## 2023-05-09 MED ORDER — MEASLES, MUMPS & RUBELLA VAC IJ SOLR: 0.5000 mL | Freq: Once | INTRAMUSCULAR | Status: DC

## 2023-05-09 MED ORDER — ZOLPIDEM TARTRATE 5 MG PO TABS: 5.0000 mg | ORAL_TABLET | Freq: Every evening | ORAL | Status: DC | PRN

## 2023-05-09 MED ORDER — BENZOCAINE-MENTHOL 20-0.5 % EX AERO: 1.0000 | INHALATION_SPRAY | CUTANEOUS | Status: DC | PRN

## 2023-05-09 MED ORDER — SIMETHICONE 80 MG PO CHEW: 80.0000 mg | CHEWABLE_TABLET | ORAL | Status: DC | PRN

## 2023-05-09 MED ORDER — SODIUM CHLORIDE 0.9% FLUSH: 3.0000 mL | Freq: Two times a day (BID) | INTRAVENOUS | Status: DC

## 2023-05-09 MED ORDER — PRENATAL MULTIVITAMIN CH
1.0000 | ORAL_TABLET | Freq: Every day | ORAL | Status: DC
  Administered 2023-05-09 – 2023-05-10 (×2): 1 via ORAL
  Filled 2023-05-09 (×2): qty 1

## 2023-05-09 MED ORDER — NIFEDIPINE ER OSMOTIC RELEASE 30 MG PO TB24
30.0000 mg | ORAL_TABLET | Freq: Every day | ORAL | Status: DC
  Administered 2023-05-09: 30 mg via ORAL
  Filled 2023-05-09: qty 1

## 2023-05-09 MED ORDER — SODIUM CHLORIDE 0.9% FLUSH: 3.0000 mL | INTRAVENOUS | Status: DC | PRN

## 2023-05-09 MED ORDER — NIFEDIPINE ER OSMOTIC RELEASE 30 MG PO TB24
90.0000 mg | ORAL_TABLET | Freq: Every morning | ORAL | Status: DC
  Administered 2023-05-10: 90 mg via ORAL
  Filled 2023-05-09: qty 3

## 2023-05-09 MED ORDER — SENNOSIDES-DOCUSATE SODIUM 8.6-50 MG PO TABS
2.0000 | ORAL_TABLET | ORAL | Status: DC
  Administered 2023-05-10: 2 via ORAL
  Filled 2023-05-09 (×2): qty 2

## 2023-05-09 MED ORDER — IBUPROFEN 600 MG PO TABS
600.0000 mg | ORAL_TABLET | Freq: Four times a day (QID) | ORAL | Status: DC
  Administered 2023-05-09 – 2023-05-10 (×3): 600 mg via ORAL
  Filled 2023-05-09 (×5): qty 1

## 2023-05-09 MED ORDER — WITCH HAZEL-GLYCERIN EX PADS: 1.0000 | MEDICATED_PAD | CUTANEOUS | Status: DC | PRN

## 2023-05-09 MED ORDER — DIPHENHYDRAMINE HCL 25 MG PO CAPS: 25.0000 mg | ORAL_CAPSULE | Freq: Four times a day (QID) | ORAL | Status: DC | PRN

## 2023-05-09 MED ORDER — COCONUT OIL OIL: 1.0000 | TOPICAL_OIL | Status: DC | PRN

## 2023-05-09 MED ORDER — DIBUCAINE (PERIANAL) 1 % EX OINT: 1.0000 | TOPICAL_OINTMENT | CUTANEOUS | Status: DC | PRN

## 2023-05-09 MED ORDER — SODIUM CHLORIDE 0.9% FLUSH: 10.0000 mL | Freq: Two times a day (BID) | INTRAVENOUS | Status: DC

## 2023-05-09 MED ORDER — ACETAMINOPHEN 325 MG PO TABS: 650.0000 mg | ORAL_TABLET | ORAL | Status: DC | PRN

## 2023-05-09 MED ORDER — ONDANSETRON HCL 4 MG/2ML IJ SOLN: 4.0000 mg | INTRAMUSCULAR | Status: DC | PRN

## 2023-05-09 NOTE — Progress Notes (Signed)
Up to see patient regarding tubal, reviewed risks/benefits with postpartum tubal ligation, recommend outpatient interval tubal, she is agreeable.   Baldemar Lenis, MD, [facility] Attending Center for [facility])

## 2023-05-09 NOTE — Progress Notes (Signed)
POSTPARTUM PROGRESS NOTE  Post Partum Day 1  Subjective:  Kristina Scott is a 32 y.o. [id number] s/p SVD at [redacted]w[redacted]d.  She reports she is doing well. No acute events overnight. She denies any problems with ambulating, voiding or po intake. Denies nausea or vomiting.  Pain is well controlled.  Lochia is < menses.  Objective: Blood pressure (!) 142/86, pulse 83, temperature 98.2 F (36.8 C), temperature source Oral, resp. rate 18, height 5\' 4"  (1.626 m), weight (!) 181.2 kg, last menstrual period 08/22/2022, SpO2 97%.  Physical Exam:  General: alert, cooperative and no distress Chest: no respiratory distress Heart:regular rate, distal pulses intact Uterine Fundus: firm, appropriately tender DVT Evaluation: No calf swelling or tenderness Extremities: trace edema Skin: warm, dry  Recent Labs    05/08/23 2141 05/09/23 0408  HGB 10.9* 10.8*  HCT 32.1* 31.3*    Assessment/Plan: Kristina Scott is a 32 y.o. [id number] s/p SVD at [redacted]w[redacted]d   PPD#1 - Doing well  Routine postpartum care  Hx of PreE in prior pregnancy: moderate and mild range pressures PP but asymptomatic, mix up with medication timing at time of transfer and likely iatrogenic in that regard.  Will CTM as starting to trend down in s/o home regimen restarted.   Contraception: Planning interval BTL Feeding: Bottle Dispo: Plan for discharge 10/20.   LOS: 1 day   Hessie Dibble, MD OB Fellow  05/09/2023, 4:45 PM

## 2023-05-10 MED ORDER — FUROSEMIDE 20 MG PO TABS: 20.0000 mg | ORAL_TABLET | Freq: Every day | ORAL | 0 refills | Status: DC

## 2023-05-10 MED ORDER — ACETAMINOPHEN 325 MG PO TABS: 650.0000 mg | ORAL_TABLET | Freq: Four times a day (QID) | ORAL | 0 refills | Status: AC | PRN

## 2023-05-10 MED ORDER — FUROSEMIDE 20 MG PO TABS
20.0000 mg | ORAL_TABLET | Freq: Every day | ORAL | Status: DC
  Administered 2023-05-10: 20 mg via ORAL
  Filled 2023-05-10: qty 1

## 2023-05-10 MED ORDER — IBUPROFEN 600 MG PO TABS: 600.0000 mg | ORAL_TABLET | Freq: Four times a day (QID) | ORAL | 0 refills | Status: AC

## 2023-05-10 NOTE — Progress Notes (Signed)
Pt has babyscripts hypertension on her phone per pt. She is aware that she needs to log bi-daily readings

## 2023-05-12 ENCOUNTER — Other Ambulatory Visit: Payer: 59

## 2023-05-12 ENCOUNTER — Encounter: Payer: Medicaid Other | Admitting: Obstetrics & Gynecology

## 2023-05-14 ENCOUNTER — Ambulatory Visit: Payer: Medicaid Other | Admitting: Obstetrics & Gynecology

## 2023-05-14 ENCOUNTER — Encounter: Payer: Self-pay | Admitting: Obstetrics & Gynecology

## 2023-05-14 VITALS — BP 144/74 | HR 73 | Ht 64.0 in | Wt 377.8 lb

## 2023-05-14 DIAGNOSIS — Z3009 Encounter for other general counseling and advice on contraception: Secondary | ICD-10-CM

## 2023-05-14 DIAGNOSIS — Z01818 Encounter for other preprocedural examination: Secondary | ICD-10-CM

## 2023-05-14 DIAGNOSIS — Z975 Presence of (intrauterine) contraceptive device: Secondary | ICD-10-CM

## 2023-05-14 NOTE — Progress Notes (Signed)
   Postpartum visit  Kristina Scott is a 32 y.o. G27P2002 female who presents s/p NSVD on 10/18.  She presents today for BP check and to discuss scheduling permanent sterilization.  She has completed childbearing and desires sterilization.  Options were reviewed with patient and she desires surgical intervention  Today she notes no acute complaints Denies fever or chills.  Tolerating gen diet.  +Flatus, Regular BMs.  Pain is well-controlled.  Lochia appropriate.  Denies headache, blurry vision or right upper quadrant pain.  She has a follow-up appointment scheduled Dr. Servando Salina Overall doing well and reports no acute complaints   Review of Systems Pertinent items are noted in HPI.    Objective:  BP (!) 144/74 (BP Location: Right Arm, Patient Position: Sitting, Cuff Size: Normal) Comment (BP Location): forearm  Pulse 73   Ht 5\' 4"  (1.626 m)   Wt (!) 377 lb 12.8 oz (171.4 kg)   LMP 08/22/2022 (Approximate) Comment: Delivered 05/08/23  Breastfeeding No   BMI 64.85 kg/m    Physical Examination:  GENERAL ASSESSMENT: well developed and well nourished SKIN: normal color, no lesions CHEST: normal air exchange, respiratory effort normal with no retractions HEART: regular rate and rhythm ABDOMEN: obese, soft, non-distended EXTREMITY: 1+ edema, no calf tenderness bilaterally PSYCH: mood appropriate, normal affect       Assessment:    Encounter for sterilization Nexplanon in place   Plan:  -Desires sterilization  Bilateral tubal ligation via salpingectomy reviewed with R&B including but not limited to bleeding, infection, injury to other organs, irreversibility and failure rate of 1/500-07/998. The absence of effect on future cycle, PMS and menopause also discussed.  Discussed plan for laparoscopic procedure via robot.  Will plan to schedule at [facility].  Consent already completed.  Surgical referral created for November 12  -Chronic HTN Doing okay with current medication and patient  has follow-up with Dr. Servando Salina  -Nexplanon in place We will also plan to remove at the time of her sterilization  -Follow-up for routine postpartum visit as scheduled  Myna Hidalgo, DO Attending Obstetrician & Gynecologist, Faculty Practice Center for [facility], [facility]

## 2023-05-15 ENCOUNTER — Other Ambulatory Visit: Payer: 59

## 2023-05-15 ENCOUNTER — Ambulatory Visit (INDEPENDENT_AMBULATORY_CARE_PROVIDER_SITE_OTHER): Payer: Medicaid Other | Admitting: Cardiology

## 2023-05-15 ENCOUNTER — Encounter: Payer: Self-pay | Admitting: Cardiology

## 2023-05-15 VITALS — BP 135/82 | HR 74

## 2023-05-15 DIAGNOSIS — I10 Essential (primary) hypertension: Secondary | ICD-10-CM

## 2023-05-15 MED ORDER — HYDROCHLOROTHIAZIDE 12.5 MG PO CAPS
12.5000 mg | ORAL_CAPSULE | Freq: Every day | ORAL | 3 refills | Status: DC
Start: 2023-05-15 — End: 2023-05-29

## 2023-05-15 MED ORDER — FUROSEMIDE 20 MG PO TABS: 20.0000 mg | ORAL_TABLET | Freq: Every day | ORAL | Status: DC

## 2023-05-15 NOTE — Progress Notes (Signed)
Cardio-Obstetrics Clinic  Follow Up Note   Date:  05/15/2023   ID:  Kristina Scott, DOB Nov 28, 1990, MRN [id number]  PCP:  Patient, No Pcp Per   [facility] Providers Cardiologist:  Thomasene Ripple, DO  Electrophysiologist:  None        Referring MD: No ref. provider found   Chief Complaint: " I am doing well"  History of Present Illness:    Kristina Scott is a 32 y.o. female [G3P2002] who returns for postpartum cardiovascular care.   Medical history includes chronic hypertension.   Since her last visit she has delivered. She was give several day of lasix. She has been doing well postpartum . Baby is exclusively bottlefed. She does have great support;no signs and symptoms of depression or anxiety.   Prior CV Studies Reviewed: The following studies were reviewed today: Reviewed echo  Past Medical History:  Diagnosis Date   Contraceptive management 12/28/2013   Encounter for Nexplanon removal 12/28/2013   High blood pressure affecting pregnancy in third trimester, delivered    Hypertension    Obesity    Pregnant state, incidental 08/23/2014    Past Surgical History:  Procedure Laterality Date   WISDOM TOOTH EXTRACTION        OB History     Gravida  3   Para  2   Term  2   Preterm      AB      Living  2      SAB      IAB      Ectopic      Multiple  0   Live Births  2               Current Medications: Current Meds  Medication Sig   acetaminophen (TYLENOL) 325 MG tablet Take 2 tablets (650 mg total) by mouth every 6 (six) hours as needed (for pain scale < 4).   furosemide (LASIX) 20 MG tablet Take 1 tablet (20 mg total) by mouth daily for 3 days.   hydrochlorothiazide (MICROZIDE) 12.5 MG capsule Take 1 capsule (12.5 mg total) by mouth daily.   ibuprofen (ADVIL) 600 MG tablet Take 1 tablet (600 mg total) by mouth every 6 (six) hours.   labetalol (NORMODYNE) 200 MG tablet Take 4 tablets (800 mg total) by mouth 3 (three)  times daily.   NIFEdipine (ADALAT CC) 30 MG 24 hr tablet Take 1 tablet (30 mg total) by mouth at bedtime.   NIFEdipine (ADALAT CC) 90 MG 24 hr tablet Take 1 tablet (90 mg total) by mouth daily.   prenatal vitamin w/FE, FA (PRENATAL 1 + 1) 27-1 MG TABS tablet Take 1 tablet by mouth daily at 12 noon.     Allergies:   Patient has no known allergies.   Social History   Socioeconomic History   Marital status: Married    Spouse name: Not on file   Number of children: Not on file   Years of education: Not on file   Highest education level: Not on file  Occupational History   Not on file  Tobacco Use   Smoking status: Former    Current packs/day: 0.00    Types: Cigarettes    Quit date: 03/07/2008    Years since quitting: 15.1   Smokeless tobacco: Never  Vaping Use   Vaping status: Never Used  Substance and Sexual Activity   Alcohol use: No    Alcohol/week: 0.0 standard drinks of alcohol   Drug  use: No   Sexual activity: Yes    Birth control/protection: None  Other Topics Concern   Not on file  Social History Narrative   Not on file   Social Determinants of Health   Financial Resource Strain: Low Risk  (10/16/2022)   Overall Financial Resource Strain (CARDIA)    Difficulty of Paying Living Expenses: Not hard at all  Food Insecurity: No Food Insecurity (05/08/2023)   Hunger Vital Sign    Worried About Running Out of Food in the Last Year: Never true    Ran Out of Food in the Last Year: Never true  Transportation Needs: No Transportation Needs (05/08/2023)   PRAPARE - Administrator, Civil Service (Medical): No    Lack of Transportation (Non-Medical): No  Physical Activity: Insufficiently Active (10/16/2022)   Exercise Vital Sign    Days of Exercise per Week: 3 days    Minutes of Exercise per Session: 20 min  Stress: No Stress Concern Present (10/16/2022)   [employer] of Occupational Health - Occupational Stress Questionnaire    Feeling of Stress : Not at  all  Social Connections: Moderately Integrated (10/16/2022)   Social Connection and Isolation Panel [NHANES]    Frequency of Communication with Friends and Family: More than three times a week    Frequency of Social Gatherings with Friends and Family: Twice a week    Attends Religious Services: 1 to 4 times per year    Active Member of [employer] or Organizations: No    Attends Banker Meetings: Never    Marital Status: Married      Family History  Problem Relation Age of Onset   Stroke Maternal Grandfather       ROS:   Please see the history of present illness.     All other systems reviewed and are negative.   Labs/EKG Reviewed:    EKG:   EKG was not ordered today.    Recent Labs: 05/08/2023: ALT 18; BUN 5; Creatinine, Ser 0.60; Potassium 3.8; Sodium 135 05/09/2023: Hemoglobin 10.8; Platelets 271   Recent Lipid Panel No results found for: "CHOL", "TRIG", "HDL", "CHOLHDL", "LDLCALC", "LDLDIRECT"  Physical Exam:    VS:  BP 135/82 (BP Location: Right Arm, Patient Position: Sitting, Cuff Size: Large)   Pulse 74   LMP 08/22/2022 (Approximate) Comment: Delivered 05/08/23  SpO2 96%     Wt Readings from Last 3 Encounters:  05/14/23 (!) 377 lb 12.8 oz (171.4 kg)  05/08/23 (!) 399 lb 6.4 oz (181.2 kg)  05/05/23 (!) 384 lb (174.2 kg)     GEN:  Well nourished, well developed in no acute distress HEENT: Normal NECK: No JVD; No carotid bruits LYMPHATICS: No lymphadenopathy CARDIAC: RRR, no murmurs, rubs, gallops RESPIRATORY:  Clear to auscultation without rales, wheezing or rhonchi  ABDOMEN: Soft, non-tender, non-distended MUSCULOSKELETAL:  No edema; No deformity  SKIN: Warm and dry NEUROLOGIC:  Alert and oriented x 3 PSYCHIATRIC:  Normal affect    Risk Assessment/Risk Calculators:                 ASSESSMENT & PLAN:    Chronic hypertension   Has delivered , baby is doing well.  Blood pressure still not yet at target: add hydrochlorothiazide 12.5  mg daily, continue current dose of nifedipine and labetalol.    Patient Instructions  Medication Instructions:  START: Hydrochlorothiazide 12.5 mg (one tablet) once daily STOP: Lasix 20 mg in 2 days (05/18/23) *If you need a refill on  your cardiac medications before your next appointment, please call your pharmacy*   Lab Work: None   Testing/Procedures: None   Follow-Up: At [facility], you and your health needs are our priority.  As part of our continuing mission to provide you with exceptional heart care, we have created designated Provider Care Teams.  These Care Teams include your primary Cardiologist (physician) and Advanced Practice Providers (APPs -  Physician Assistants and Nurse Practitioners) who all work together to provide you with the care you need, when you need it.  Your next appointment:   16 week(s)  Provider:   Thomasene Ripple, DO [address], [place], [place] [address]   Dispo:  No follow-ups on file.   Medication Adjustments/Labs and Tests Ordered: Current medicines are reviewed at length with the patient today.  Concerns regarding medicines are outlined above.  Tests Ordered: No orders of the defined types were placed in this encounter.  Medication Changes: Meds ordered this encounter  Medications   hydrochlorothiazide (MICROZIDE) 12.5 MG capsule    Sig: Take 1 capsule (12.5 mg total) by mouth daily.    Dispense:  90 capsule    Refill:  3   furosemide (LASIX) 20 MG tablet    Sig: Take 1 tablet (20 mg total) by mouth daily for 3 days.

## 2023-05-15 NOTE — Patient Instructions (Addendum)
Medication Instructions:  START: Hydrochlorothiazide 12.5 mg (one tablet) once daily STOP: Lasix 20 mg in 2 days (05/18/23) *If you need a refill on your cardiac medications before your next appointment, please call your pharmacy*   Lab Work: None   Testing/Procedures: None   Follow-Up: At [facility], you and your health needs are our priority.  As part of our continuing mission to provide you with exceptional heart care, we have created designated Provider Care Teams.  These Care Teams include your primary Cardiologist (physician) and Advanced Practice Providers (APPs -  Physician Assistants and Nurse Practitioners) who all work together to provide you with the care you need, when you need it.  Your next appointment:   16 week(s)  Provider:   Thomasene Ripple, DO [address], [place], [place] [address]

## 2023-05-19 ENCOUNTER — Other Ambulatory Visit: Payer: 59

## 2023-05-22 ENCOUNTER — Other Ambulatory Visit: Payer: 59

## 2023-05-22 ENCOUNTER — Encounter: Payer: Medicaid Other | Admitting: Obstetrics & Gynecology

## 2023-05-26 ENCOUNTER — Other Ambulatory Visit: Payer: 59

## 2023-05-27 ENCOUNTER — Inpatient Hospital Stay (HOSPITAL_COMMUNITY)
Admission: EM | Admit: 2023-05-27 | Discharge: 2023-05-29 | DRG: 776 | Disposition: A | Discharge status: Home / Self Care | Payer: Medicaid Other | Attending: Family Medicine | Admitting: Family Medicine

## 2023-05-27 ENCOUNTER — Emergency Department (HOSPITAL_COMMUNITY): Payer: Medicaid Other

## 2023-05-27 ENCOUNTER — Encounter (HOSPITAL_COMMUNITY): Payer: Self-pay | Admitting: *Deleted

## 2023-05-27 ENCOUNTER — Other Ambulatory Visit: Payer: Self-pay

## 2023-05-27 DIAGNOSIS — I10 Essential (primary) hypertension: Secondary | ICD-10-CM | POA: Diagnosis present

## 2023-05-27 DIAGNOSIS — Z79899 Other long term (current) drug therapy: Secondary | ICD-10-CM

## 2023-05-27 DIAGNOSIS — Z823 Family history of stroke: Secondary | ICD-10-CM

## 2023-05-27 DIAGNOSIS — Y92009 Unspecified place in unspecified non-institutional (private) residence as the place of occurrence of the external cause: Secondary | ICD-10-CM

## 2023-05-27 DIAGNOSIS — O9963 Diseases of the digestive system complicating the puerperium: Principal | ICD-10-CM | POA: Diagnosis present

## 2023-05-27 DIAGNOSIS — K859 Acute pancreatitis without necrosis or infection, unspecified: Principal | ICD-10-CM | POA: Diagnosis present

## 2023-05-27 DIAGNOSIS — R7401 Elevation of levels of liver transaminase levels: Secondary | ICD-10-CM | POA: Insufficient documentation

## 2023-05-27 DIAGNOSIS — E785 Hyperlipidemia, unspecified: Secondary | ICD-10-CM | POA: Diagnosis present

## 2023-05-27 DIAGNOSIS — T502X5A Adverse effect of carbonic-anhydrase inhibitors, benzothiadiazides and other diuretics, initial encounter: Secondary | ICD-10-CM | POA: Diagnosis present

## 2023-05-27 DIAGNOSIS — K853 Drug induced acute pancreatitis without necrosis or infection: Secondary | ICD-10-CM | POA: Diagnosis present

## 2023-05-27 DIAGNOSIS — O165 Unspecified maternal hypertension, complicating the puerperium: Secondary | ICD-10-CM | POA: Diagnosis present

## 2023-05-27 DIAGNOSIS — Z87891 Personal history of nicotine dependence: Secondary | ICD-10-CM

## 2023-05-27 DIAGNOSIS — K8020 Calculus of gallbladder without cholecystitis without obstruction: Secondary | ICD-10-CM | POA: Diagnosis present

## 2023-05-27 LAB — CBC
HCT: 40.1 % (ref 36.0–46.0)
Hemoglobin: 12.8 g/dL (ref 12.0–15.0)
MCH: 30 pg (ref 26.0–34.0)
MCHC: 31.9 g/dL (ref 30.0–36.0)
MCV: 93.9 fL (ref 80.0–100.0)
Platelets: 332 10*3/uL (ref 150–400)
RBC: 4.27 MIL/uL (ref 3.87–5.11)
RDW: 12.8 % (ref 11.5–15.5)
WBC: 8.7 10*3/uL (ref 4.0–10.5)
nRBC: 0 % (ref 0.0–0.2)

## 2023-05-27 LAB — URINALYSIS, ROUTINE W REFLEX MICROSCOPIC
Bilirubin Urine: NEGATIVE
Glucose, UA: NEGATIVE mg/dL
Ketones, ur: 5 mg/dL — AB
Nitrite: NEGATIVE
Protein, ur: 30 mg/dL — AB
Specific Gravity, Urine: 1.02 (ref 1.005–1.030)
pH: 5 (ref 5.0–8.0)

## 2023-05-27 LAB — COMPREHENSIVE METABOLIC PANEL
ALT: 88 U/L — ABNORMAL HIGH (ref 0–44)
AST: 108 U/L — ABNORMAL HIGH (ref 15–41)
Albumin: 4 g/dL (ref 3.5–5.0)
Alkaline Phosphatase: 150 U/L — ABNORMAL HIGH (ref 38–126)
Anion gap: 8 (ref 5–15)
BUN: 12 mg/dL (ref 6–20)
CO2: 27 mmol/L (ref 22–32)
Calcium: 9.9 mg/dL (ref 8.9–10.3)
Chloride: 102 mmol/L (ref 98–111)
Creatinine, Ser: 1.11 mg/dL — ABNORMAL HIGH (ref 0.44–1.00)
GFR, Estimated: >60 mL/min (ref >60)
Glucose, Bld: 93 mg/dL (ref 70–99)
Potassium: 4 mmol/L (ref 3.5–5.1)
Sodium: 137 mmol/L (ref 135–145)
Total Bilirubin: 1.3 mg/dL — ABNORMAL HIGH (ref <1.2)
Total Protein: 8.5 g/dL — ABNORMAL HIGH (ref 6.5–8.1)

## 2023-05-27 LAB — LIPASE, BLOOD: Lipase: 6924 U/L — ABNORMAL HIGH (ref 11–51)

## 2023-05-27 MED ORDER — IOHEXOL 300 MG/ML  SOLN
100.0000 mL | Freq: Once | INTRAMUSCULAR | Status: AC | PRN
  Administered 2023-05-27: 100 mL via INTRAVENOUS

## 2023-05-27 NOTE — ED Provider Notes (Signed)
Kristina Scott   CSN: [id number] Arrival date & time: 05/27/23  1437     History {Add pertinent medical, surgical, social history, OB history to HPI:1} Chief Complaint  Patient presents with   Abdominal Pain    Kristina Scott is a 32 y.o. female.   Abdominal Pain Associated symptoms: nausea   Associated symptoms: no chest pain, no chills, no cough, no diarrhea, no dysuria, no fever, no shortness of breath, no vaginal bleeding, no vaginal discharge and no vomiting        Kristina Scott is a 32 y.o. female past medical history of hypertension, 18 days postpartum uncomplicated vaginal delivery.  She presents to the Emergency Department complaining of upper abdominal pain, some nausea.  Pain mostly right upper abdomen.  Postprandial pain.  Denies any flank pain or dysuria symptoms.  No lower abdominal pain or vaginal bleeding.  Denies any complication with pregnancy or delivery.  No prior abdominal surgeries.  Chronic hypertension, Takes nifedipine, hydrochlorothiazide and labetalol.  Has taken 1 dose of her nifedipine and 1 dose labetalol earlier today.   Home Medications Prior to Admission medications   Medication Sig Start Date End Date Taking? Authorizing Provider  acetaminophen (TYLENOL) 325 MG tablet Take 2 tablets (650 mg total) by mouth every 6 (six) hours as needed (for pain scale < 4). 05/10/23   Myna Hidalgo, DO  furosemide (LASIX) 20 MG tablet Take 1 tablet (20 mg total) by mouth daily for 3 days. 05/15/23 05/18/23  Tobb, Kardie, DO  hydrochlorothiazide (MICROZIDE) 12.5 MG capsule Take 1 capsule (12.5 mg total) by mouth daily. 05/15/23 08/13/23  Tobb, Kardie, DO  ibuprofen (ADVIL) 600 MG tablet Take 1 tablet (600 mg total) by mouth every 6 (six) hours. 05/10/23   Myna Hidalgo, DO  labetalol (NORMODYNE) 200 MG tablet Take 4 tablets (800 mg total) by mouth 3 (three) times daily. 05/01/23   Myna Hidalgo, DO   NIFEdipine (ADALAT CC) 30 MG 24 hr tablet Take 1 tablet (30 mg total) by mouth at bedtime. 04/03/23   Tobb, Kardie, DO  NIFEdipine (ADALAT CC) 90 MG 24 hr tablet Take 1 tablet (90 mg total) by mouth daily. 04/03/23   Tobb, Kardie, DO  prenatal vitamin w/FE, FA (PRENATAL 1 + 1) 27-1 MG TABS tablet Take 1 tablet by mouth daily at 12 noon. 10/16/22   Adline Potter, NP      Allergies    Patient has no known allergies.    Review of Systems   Review of Systems  Constitutional:  Negative for appetite change, chills and fever.  Respiratory:  Negative for cough and shortness of breath.   Cardiovascular:  Negative for chest pain.  Gastrointestinal:  Positive for abdominal pain and nausea. Negative for diarrhea and vomiting.  Genitourinary:  Negative for dysuria, flank pain, vaginal bleeding and vaginal discharge.  Musculoskeletal:  Negative for back pain.  Neurological:  Negative for weakness and headaches.    Physical Exam Updated Vital Signs BP (!) 148/87   Pulse 73   Temp 97.9 F (36.6 C) (Oral)   Resp 16   Ht 5\' 4"  (1.626 m)   Wt (!) 171 kg   LMP 08/22/2022 (Approximate) Comment: Delivered 05/08/23  SpO2 100%   BMI 64.71 kg/m  Physical Exam Vitals and nursing Scott reviewed.  Constitutional:      General: She is not in acute distress.    Appearance: She is well-developed. She is not ill-appearing  or toxic-appearing.  HENT:     Mouth/Throat:     Mouth: Mucous membranes are moist.  Cardiovascular:     Rate and Rhythm: Normal rate and regular rhythm.     Pulses: Normal pulses.  Pulmonary:     Effort: Pulmonary effort is normal.  Abdominal:     General: There is no distension.     Palpations: Abdomen is soft.     Tenderness: There is abdominal tenderness. There is no guarding.     Comments: Tender to palpation, epigastric and right upper quadrant.  No guarding or rebound.  Skin:    General: Skin is warm.     Capillary Refill: Capillary refill takes less than 2 seconds.   Neurological:     General: No focal deficit present.     Mental Status: She is alert.     Sensory: No sensory deficit.     Motor: No weakness.     ED Results / Procedures / Treatments   Labs (all labs ordered are listed, but only abnormal results are displayed) Labs Reviewed  LIPASE, BLOOD - Abnormal; Notable for the following components:      Result Value   Lipase 6,924 (*)    All other components within normal limits  COMPREHENSIVE METABOLIC PANEL - Abnormal; Notable for the following components:   Creatinine, Ser 1.11 (*)    Total Protein 8.5 (*)    AST 108 (*)    ALT 88 (*)    Alkaline Phosphatase 150 (*)    Total Bilirubin 1.3 (*)    All other components within normal limits  URINALYSIS, ROUTINE W REFLEX MICROSCOPIC - Abnormal; Notable for the following components:   Color, Urine AMBER (*)    APPearance HAZY (*)    Hgb urine dipstick SMALL (*)    Ketones, ur 5 (*)    Protein, ur 30 (*)    Leukocytes,Ua MODERATE (*)    Bacteria, UA RARE (*)    All other components within normal limits  URINE CULTURE  CBC    EKG None  Radiology US Abdomen Limited  Result Date: 05/27/2023 CLINICAL DATA:  Right upper quadrant pain EXAM: ULTRASOUND ABDOMEN LIMITED RIGHT UPPER QUADRANT COMPARISON:  None Available. FINDINGS: Gallbladder: Multiple mobile gallstones measuring up to 8 mm. No wall thickening or sonographic Murphy sign. Common bile duct: Diameter: Normal caliber, 2 mm Liver: No focal lesion identified. Within normal limits in parenchymal echogenicity. Portal vein is patent on color Doppler imaging with normal direction of blood flow towards the liver. Other: None. IMPRESSION: Cholelithiasis.  No sonographic evidence of acute cholecystitis. Electronically Signed   By: Charlett Nose M.D.   On: 05/27/2023 19:30    Procedures Procedures  {Document cardiac monitor, telemetry assessment procedure when appropriate:1}  Medications Ordered in ED Medications - No data to  display  ED Course/ Medical Decision Making/ A&P   {   Click here for ABCD2, HEART and other calculatorsREFRESH Scott before signing :1}                              Medical Decision Making Patient here with upper abdominal pain hypertension pain seems mostly postprandial.  Mild nausea reported without vomiting or fever.  Pain began last evening was improved after taking ibuprofen.  18 days postpartum uncomplicated vaginal delivery.  Denies any peripheral edema or urinary symptoms.  No flank pain  Differential includes acute cholecystitis, cholelithiasis, cholangitis, preeclampsia/help syndrome  Amount and/or Complexity of Data Reviewed Labs: ordered.    Details: Labs interpreted by me no leukocytosis, hemoglobin unremarkable at 12.8.  Platelets reassuring.  Chemistries show elevated LFTs and total bilirubin is 1.3.  Lipase near 7000.  Urinalysis does show some hemoglobin with proteinuria, moderate leukocytes 11-20 white cells and rare bacteria.  Culture is pending.  Patient denies any dysuria symptoms. Radiology: ordered.    Details: Abdominal ultrasound shows cholelithiasis no evidence of acute cholecystitis.  CT abdomen and pelvis ordered Discussion of management or test interpretation with external provider(s): Concerning workup, pancreatitis, cholecystitis.  Doubt hellp syndrome  Discussed findings with OB, Dr. Jolayne Panther.  Low concern for preeclampsia/help syndrome.  Workup without evidence of hemolysis and hypertension chronic and similar to previous.  No further recommendations from OB perspective. Patient will need CT and consult with general surgery       {Document critical care time when appropriate:1} {Document review of labs and clinical decision tools ie heart score, Chads2Vasc2 etc:1}  {Document your independent review of radiology images, and any outside records:1} {Document your discussion with family members, caretakers, and with consultants:1} {Document social  determinants of health affecting pt's care:1} {Document your decision making why or why not admission, treatments were needed:1} Final Clinical Impression(s) / ED Diagnoses Final diagnoses:  None    Rx / DC Orders ED Discharge Orders     None

## 2023-05-27 NOTE — ED Triage Notes (Signed)
Pt states she is 18 days postpartum and she is having upper abdominal pain radiating around to bilateral lower back;  Pt denies any n/v/d and states last BM was yesterday and normal  Pt denies any urinary sx or vaginal bleeding

## 2023-05-27 NOTE — ED Provider Notes (Incomplete)
[place] EMERGENCY DEPARTMENT AT [facility] Provider Note   CSN: [id number] Arrival date & time: 05/27/23  1437     History {Add pertinent medical, surgical, social history, OB history to HPI:1} Chief Complaint  Patient presents with  . Abdominal Pain    Kristina Scott is a 33 y.o. female.   Abdominal Pain Associated symptoms: nausea   Associated symptoms: no chest pain, no chills, no cough, no diarrhea, no dysuria, no fever, no shortness of breath, no vaginal bleeding, no vaginal discharge and no vomiting        Kristina Scott is a 32 y.o. female past medical history of hypertension, 18 days postpartum uncomplicated vaginal delivery.  She presents to the Emergency Department complaining of upper abdominal pain, some nausea.  Pain mostly right upper abdomen.  Postprandial pain.  Denies any flank pain or dysuria symptoms.  No lower abdominal pain or vaginal bleeding.  Denies any complication with pregnancy or delivery.  No prior abdominal surgeries.  Chronic hypertension, Takes nifedipine, hydrochlorothiazide and labetalol.  Has taken 1 dose of her nifedipine and 1 dose labetalol earlier today.   Home Medications Prior to Admission medications   Medication Sig Start Date End Date Taking? Authorizing Provider  acetaminophen (TYLENOL) 325 MG tablet Take 2 tablets (650 mg total) by mouth every 6 (six) hours as needed (for pain scale < 4). 05/10/23   Myna Hidalgo, DO  furosemide (LASIX) 20 MG tablet Take 1 tablet (20 mg total) by mouth daily for 3 days. 05/15/23 05/18/23  Tobb, Kardie, DO  hydrochlorothiazide (MICROZIDE) 12.5 MG capsule Take 1 capsule (12.5 mg total) by mouth daily. 05/15/23 08/13/23  Tobb, Kardie, DO  ibuprofen (ADVIL) 600 MG tablet Take 1 tablet (600 mg total) by mouth every 6 (six) hours. 05/10/23   Myna Hidalgo, DO  labetalol (NORMODYNE) 200 MG tablet Take 4 tablets (800 mg total) by mouth 3 (three) times daily. 05/01/23   Myna Hidalgo, DO   NIFEdipine (ADALAT CC) 30 MG 24 hr tablet Take 1 tablet (30 mg total) by mouth at bedtime. 04/03/23   Tobb, Kardie, DO  NIFEdipine (ADALAT CC) 90 MG 24 hr tablet Take 1 tablet (90 mg total) by mouth daily. 04/03/23   Tobb, Kardie, DO  prenatal vitamin w/FE, FA (PRENATAL 1 + 1) 27-1 MG TABS tablet Take 1 tablet by mouth daily at 12 noon. 10/16/22   Adline Potter, NP      Allergies    Patient has no known allergies.    Review of Systems   Review of Systems  Constitutional:  Negative for appetite change, chills and fever.  Respiratory:  Negative for cough and shortness of breath.   Cardiovascular:  Negative for chest pain.  Gastrointestinal:  Positive for abdominal pain and nausea. Negative for diarrhea and vomiting.  Genitourinary:  Negative for dysuria, flank pain, vaginal bleeding and vaginal discharge.  Musculoskeletal:  Negative for back pain.  Neurological:  Negative for weakness and headaches.    Physical Exam Updated Vital Signs BP (!) 148/87   Pulse 73   Temp 97.9 F (36.6 C) (Oral)   Resp 16   Ht 5\' 4"  (1.626 m)   Wt (!) 171 kg   LMP 08/22/2022 (Approximate) Comment: Delivered 05/08/23  SpO2 100%   BMI 64.71 kg/m  Physical Exam Vitals and nursing note reviewed.  Constitutional:      General: She is not in acute distress.    Appearance: She is well-developed. She is not ill-appearing  or toxic-appearing.  HENT:     Mouth/Throat:     Mouth: Mucous membranes are moist.  Cardiovascular:     Rate and Rhythm: Normal rate and regular rhythm.     Pulses: Normal pulses.  Pulmonary:     Effort: Pulmonary effort is normal.  Abdominal:     General: There is no distension.     Palpations: Abdomen is soft.     Tenderness: There is abdominal tenderness. There is no guarding.     Comments: Tender to palpation, epigastric and right upper quadrant.  No guarding or rebound.  Skin:    General: Skin is warm.     Capillary Refill: Capillary refill takes less than 2 seconds.   Neurological:     General: No focal deficit present.     Mental Status: She is alert.     Sensory: No sensory deficit.     Motor: No weakness.     ED Results / Procedures / Treatments   Labs (all labs ordered are listed, but only abnormal results are displayed) Labs Reviewed  LIPASE, BLOOD - Abnormal; Notable for the following components:      Result Value   Lipase 6,924 (*)    All other components within normal limits  COMPREHENSIVE METABOLIC PANEL - Abnormal; Notable for the following components:   Creatinine, Ser 1.11 (*)    Total Protein 8.5 (*)    AST 108 (*)    ALT 88 (*)    Alkaline Phosphatase 150 (*)    Total Bilirubin 1.3 (*)    All other components within normal limits  URINALYSIS, ROUTINE W REFLEX MICROSCOPIC - Abnormal; Notable for the following components:   Color, Urine AMBER (*)    APPearance HAZY (*)    Hgb urine dipstick SMALL (*)    Ketones, ur 5 (*)    Protein, ur 30 (*)    Leukocytes,Ua MODERATE (*)    Bacteria, UA RARE (*)    All other components within normal limits  URINE CULTURE  CBC    EKG None  Radiology CT ABDOMEN PELVIS W CONTRAST  Result Date: 05/27/2023 CLINICAL DATA:  Right upper quadrant abdominal pain, elevated LFTs, and lipase. 18 days postpartum. EXAM: CT ABDOMEN AND PELVIS WITH CONTRAST TECHNIQUE: Multidetector CT imaging of the abdomen and pelvis was performed using the standard protocol following bolus administration of intravenous contrast. RADIATION DOSE REDUCTION: This exam was performed according to the departmental dose-optimization program which includes automated exposure control, adjustment of the mA and/or kV according to patient size and/or use of iterative reconstruction technique. CONTRAST:  OMNIPAQUE IOHEXOL 300 MG/ML  SOLN COMPARISON:  None Available. FINDINGS: Lower chest: Lung bases are clear. Hepatobiliary: Liver is within normal limits. Suspected small noncalcified gallstones (series 2/image 40), without  associated inflammatory changes. No intrahepatic or extrahepatic duct dilatation. Pancreas: Peripancreatic inflammatory changes/fluid, reflecting acute pancreatitis. No walled-off necrosis. Spleen: Within normal limits. Adrenals/Urinary Tract: Adrenal glands are within normal limits. Kidneys are within normal limits.  No hydronephrosis. Bladder is within normal limits. Stomach/Bowel: Stomach is within normal limits. No evidence of bowel obstruction. Normal appendix (series 2/image 62). No colonic wall thickening or inflammatory changes. Vascular/Lymphatic: No evidence of abdominal aortic aneurysm. No suspicious abdominopelvic lymphadenopathy. Reproductive: Uterus is within normal limits. Bilateral ovaries are within normal limits. Other: No abdominopelvic ascites. Musculoskeletal: Visualized osseous structures are within normal limits. IMPRESSION: Acute pancreatitis. No walled-off necrosis. Suspected small noncalcified gallstones, without associated inflammatory changes. No intrahepatic or extrahepatic ductal dilatation. Electronically Signed  By: Charline Bills M.D.   On: 05/27/2023 22:09   US Abdomen Limited  Result Date: 05/27/2023 CLINICAL DATA:  Right upper quadrant pain EXAM: ULTRASOUND ABDOMEN LIMITED RIGHT UPPER QUADRANT COMPARISON:  None Available. FINDINGS: Gallbladder: Multiple mobile gallstones measuring up to 8 mm. No wall thickening or sonographic Murphy sign. Common bile duct: Diameter: Normal caliber, 2 mm Liver: No focal lesion identified. Within normal limits in parenchymal echogenicity. Portal vein is patent on color Doppler imaging with normal direction of blood flow towards the liver. Other: None. IMPRESSION: Cholelithiasis.  No sonographic evidence of acute cholecystitis. Electronically Signed   By: Charlett Nose M.D.   On: 05/27/2023 19:30    Procedures Procedures  {Document cardiac monitor, telemetry assessment procedure when appropriate:1}  Medications Ordered in ED Medications  - No data to display  ED Course/ Medical Decision Making/ A&P   {   Click here for ABCD2, HEART and other calculatorsREFRESH Note before signing :1}                              Medical Decision Making Patient here with upper abdominal pain hypertension pain seems mostly postprandial.  Mild nausea reported without vomiting or fever.  Pain began last evening was improved after taking ibuprofen.  18 days postpartum uncomplicated vaginal delivery.  Denies any peripheral edema or urinary symptoms.  No flank pain  Differential includes acute cholecystitis, cholelithiasis, cholangitis, preeclampsia/help syndrome    Amount and/or Complexity of Data Reviewed Labs: ordered.    Details: Labs interpreted by me no leukocytosis, hemoglobin unremarkable at 12.8.  Platelets reassuring.  Chemistries show elevated LFTs and total bilirubin is 1.3.  Lipase near 7000.  Urinalysis does show some hemoglobin with proteinuria, moderate leukocytes 11-20 white cells and rare bacteria.  Culture is pending.  Patient denies any dysuria symptoms. Radiology: ordered.    Details: Abdominal ultrasound shows cholelithiasis no evidence of acute cholecystitis.  CT abdomen and pelvis ordered for further evaluation shows acute pancreatitis without necrosis Discussion of management or test interpretation with external provider(s): Concerning workup, pancreatitis, cholecystitis.  Doubt hellp syndrome  Discussed findings with OB, Dr. Jolayne Panther.  Low concern for preeclampsia/help syndrome.  Workup without evidence of hemolysis and hypertension chronic and similar to previous.  No further recommendations from OB perspective. Patient will need CT and possible consult with general surgery  On recheck, patient resting comfortably.  Has not required pain medication but was offered.  Agreeable to hospital admission for pancreatitis.      Risk Prescription drug management.     {Document critical care time when  appropriate:1} {Document review of labs and clinical decision tools ie heart score, Chads2Vasc2 etc:1}  {Document your independent review of radiology images, and any outside records:1} {Document your discussion with family members, caretakers, and with consultants:1} {Document social determinants of health affecting pt's care:1} {Document your decision making why or why not admission, treatments were needed:1} Final Clinical Impression(s) / ED Diagnoses Final diagnoses:  None    Rx / DC Orders ED Discharge Orders     None

## 2023-05-28 ENCOUNTER — Inpatient Hospital Stay (HOSPITAL_COMMUNITY): Payer: Medicaid Other

## 2023-05-28 ENCOUNTER — Encounter (HOSPITAL_COMMUNITY): Payer: Self-pay | Admitting: Family Medicine

## 2023-05-28 DIAGNOSIS — R7401 Elevation of levels of liver transaminase levels: Secondary | ICD-10-CM | POA: Diagnosis present

## 2023-05-28 DIAGNOSIS — Z87891 Personal history of nicotine dependence: Secondary | ICD-10-CM | POA: Diagnosis not present

## 2023-05-28 DIAGNOSIS — O165 Unspecified maternal hypertension, complicating the puerperium: Secondary | ICD-10-CM | POA: Diagnosis present

## 2023-05-28 DIAGNOSIS — O9963 Diseases of the digestive system complicating the puerperium: Secondary | ICD-10-CM | POA: Diagnosis present

## 2023-05-28 DIAGNOSIS — K859 Acute pancreatitis without necrosis or infection, unspecified: Secondary | ICD-10-CM

## 2023-05-28 DIAGNOSIS — Z79899 Other long term (current) drug therapy: Secondary | ICD-10-CM | POA: Diagnosis not present

## 2023-05-28 DIAGNOSIS — T502X5A Adverse effect of carbonic-anhydrase inhibitors, benzothiadiazides and other diuretics, initial encounter: Secondary | ICD-10-CM | POA: Diagnosis present

## 2023-05-28 DIAGNOSIS — Y92009 Unspecified place in unspecified non-institutional (private) residence as the place of occurrence of the external cause: Secondary | ICD-10-CM | POA: Diagnosis not present

## 2023-05-28 DIAGNOSIS — K8020 Calculus of gallbladder without cholecystitis without obstruction: Secondary | ICD-10-CM | POA: Diagnosis present

## 2023-05-28 DIAGNOSIS — K853 Drug induced acute pancreatitis without necrosis or infection: Secondary | ICD-10-CM | POA: Diagnosis present

## 2023-05-28 DIAGNOSIS — Z823 Family history of stroke: Secondary | ICD-10-CM | POA: Diagnosis not present

## 2023-05-28 DIAGNOSIS — E785 Hyperlipidemia, unspecified: Secondary | ICD-10-CM | POA: Diagnosis present

## 2023-05-28 DIAGNOSIS — I10 Essential (primary) hypertension: Secondary | ICD-10-CM

## 2023-05-28 LAB — HEPATITIS PANEL, ACUTE
HCV Ab: NONREACTIVE
Hep A IgM: NONREACTIVE
Hep B C IgM: NONREACTIVE
Hepatitis B Surface Ag: NONREACTIVE

## 2023-05-28 LAB — CBC WITH DIFFERENTIAL/PLATELET
Abs Immature Granulocytes: 0 10*3/uL (ref 0.00–0.07)
Basophils Absolute: 0 10*3/uL (ref 0.0–0.1)
Basophils Relative: 0 %
Eosinophils Absolute: 0 10*3/uL (ref 0.0–0.5)
Eosinophils Relative: 1 %
HCT: 36.9 % (ref 36.0–46.0)
Hemoglobin: 12.3 g/dL (ref 12.0–15.0)
Immature Granulocytes: 0 %
Lymphocytes Relative: 29 %
Lymphs Abs: 1.6 10*3/uL (ref 0.7–4.0)
MCH: 30.9 pg (ref 26.0–34.0)
MCHC: 33.3 g/dL (ref 30.0–36.0)
MCV: 92.7 fL (ref 80.0–100.0)
Monocytes Absolute: 0.3 10*3/uL (ref 0.1–1.0)
Monocytes Relative: 5 %
Neutro Abs: 3.8 10*3/uL (ref 1.7–7.7)
Neutrophils Relative %: 65 %
Platelets: 293 10*3/uL (ref 150–400)
RBC: 3.98 MIL/uL (ref 3.87–5.11)
RDW: 12.8 % (ref 11.5–15.5)
WBC: 5.7 10*3/uL (ref 4.0–10.5)
nRBC: 0 % (ref 0.0–0.2)

## 2023-05-28 LAB — LIPID PANEL
Cholesterol: 215 mg/dL — ABNORMAL HIGH (ref 0–200)
HDL: 64 mg/dL (ref >40)
LDL Cholesterol: 143 mg/dL — ABNORMAL HIGH (ref 0–99)
Total CHOL/HDL Ratio: 3.4 {ratio}
Triglycerides: 39 mg/dL (ref <150)
VLDL: 8 mg/dL (ref 0–40)

## 2023-05-28 LAB — COMPREHENSIVE METABOLIC PANEL
ALT: 88 U/L — ABNORMAL HIGH (ref 0–44)
AST: 77 U/L — ABNORMAL HIGH (ref 15–41)
Albumin: 3.8 g/dL (ref 3.5–5.0)
Alkaline Phosphatase: 152 U/L — ABNORMAL HIGH (ref 38–126)
Anion gap: 10 (ref 5–15)
BUN: 11 mg/dL (ref 6–20)
CO2: 23 mmol/L (ref 22–32)
Calcium: 9.5 mg/dL (ref 8.9–10.3)
Chloride: 104 mmol/L (ref 98–111)
Creatinine, Ser: 0.9 mg/dL (ref 0.44–1.00)
GFR, Estimated: >60 mL/min (ref >60)
Glucose, Bld: 95 mg/dL (ref 70–99)
Potassium: 3.5 mmol/L (ref 3.5–5.1)
Sodium: 137 mmol/L (ref 135–145)
Total Bilirubin: 0.8 mg/dL (ref <1.2)
Total Protein: 8.1 g/dL (ref 6.5–8.1)

## 2023-05-28 LAB — LIPASE, BLOOD: Lipase: 1609 U/L — ABNORMAL HIGH (ref 11–51)

## 2023-05-28 LAB — MAGNESIUM: Magnesium: 2.1 mg/dL (ref 1.7–2.4)

## 2023-05-28 LAB — TSH: TSH: 1.995 u[IU]/mL (ref 0.350–4.500)

## 2023-05-28 MED ORDER — PRENATAL PLUS 27-1 MG PO TABS
1.0000 | ORAL_TABLET | Freq: Every day | ORAL | Status: DC
Start: 2023-05-28 — End: 2023-05-29
  Filled 2023-05-28 (×5): qty 1

## 2023-05-28 MED ORDER — HYDRALAZINE HCL 20 MG/ML IJ SOLN: 10.0000 mg | INTRAMUSCULAR | Status: DC | PRN

## 2023-05-28 MED ORDER — GADOBUTROL 1 MMOL/ML IV SOLN
9.0000 mL | Freq: Once | INTRAVENOUS | Status: AC | PRN
Start: 2023-05-28 — End: 2023-05-28
  Administered 2023-05-28: 9 mL via INTRAVENOUS

## 2023-05-28 MED ORDER — HYDROCHLOROTHIAZIDE 12.5 MG PO TABS: 12.5000 mg | ORAL_TABLET | Freq: Every day | ORAL | Status: DC

## 2023-05-28 MED ORDER — ACETAMINOPHEN 650 MG RE SUPP: 650.0000 mg | Freq: Four times a day (QID) | RECTAL | Status: DC | PRN

## 2023-05-28 MED ORDER — LABETALOL HCL 200 MG PO TABS: 800.0000 mg | ORAL_TABLET | Freq: Three times a day (TID) | ORAL | Status: DC

## 2023-05-28 MED ORDER — LACTATED RINGERS IV SOLN: INTRAVENOUS | Status: AC

## 2023-05-28 MED ORDER — ONDANSETRON HCL 4 MG/2ML IJ SOLN
4.0000 mg | Freq: Four times a day (QID) | INTRAMUSCULAR | Status: DC | PRN
  Administered 2023-05-28: 4 mg via INTRAVENOUS
  Filled 2023-05-28: qty 2

## 2023-05-28 MED ORDER — ADULT MULTIVITAMIN W/MINERALS CH
1.0000 | ORAL_TABLET | Freq: Every day | ORAL | Status: DC
  Administered 2023-05-28 – 2023-05-29 (×2): 1 via ORAL
  Filled 2023-05-28 (×2): qty 1

## 2023-05-28 MED ORDER — LABETALOL HCL 200 MG PO TABS
400.0000 mg | ORAL_TABLET | Freq: Three times a day (TID) | ORAL | Status: DC
  Administered 2023-05-28 – 2023-05-29 (×4): 400 mg via ORAL
  Filled 2023-05-28 (×5): qty 2

## 2023-05-28 MED ORDER — ACETAMINOPHEN 325 MG PO TABS: 650.0000 mg | ORAL_TABLET | Freq: Four times a day (QID) | ORAL | Status: DC | PRN

## 2023-05-28 MED ORDER — NIFEDIPINE ER OSMOTIC RELEASE 30 MG PO TB24
90.0000 mg | ORAL_TABLET | Freq: Every day | ORAL | Status: DC
  Administered 2023-05-28 – 2023-05-29 (×2): 90 mg via ORAL
  Filled 2023-05-28 (×2): qty 3

## 2023-05-28 MED ORDER — LACTATED RINGERS IV SOLN: INTRAVENOUS | Status: DC

## 2023-05-28 MED ORDER — NIFEDIPINE ER OSMOTIC RELEASE 30 MG PO TB24: 90.0000 mg | ORAL_TABLET | Freq: Every day | ORAL | Status: DC

## 2023-05-28 MED ORDER — HEPARIN SODIUM (PORCINE) 5000 UNIT/ML IJ SOLN
5000.0000 [IU] | Freq: Three times a day (TID) | INTRAMUSCULAR | Status: DC
  Administered 2023-05-28 – 2023-05-29 (×4): 5000 [IU] via SUBCUTANEOUS
  Filled 2023-05-28 (×4): qty 1

## 2023-05-28 MED ORDER — OXYCODONE HCL 5 MG PO TABS
5.0000 mg | ORAL_TABLET | ORAL | Status: DC | PRN
  Administered 2023-05-28: 5 mg via ORAL
  Filled 2023-05-28: qty 1

## 2023-05-28 MED ORDER — HYDROCHLOROTHIAZIDE 25 MG PO TABS
25.0000 mg | ORAL_TABLET | Freq: Every day | ORAL | Status: DC
  Administered 2023-05-28 – 2023-05-29 (×2): 25 mg via ORAL
  Filled 2023-05-28 (×2): qty 1

## 2023-05-28 MED ORDER — ONDANSETRON HCL 4 MG PO TABS: 4.0000 mg | ORAL_TABLET | Freq: Four times a day (QID) | ORAL | Status: DC | PRN

## 2023-05-28 NOTE — Discharge Instructions (Signed)

## 2023-05-28 NOTE — ED Notes (Signed)
Pt back from MRI. Per Wallene Huh, MRI machine not working correctly and is being worked on. Pt. MRI not completed.

## 2023-05-28 NOTE — H&P (Signed)
History and Physical    Patient: Kristina Scott ZOX:[id number] DOB: 1990/12/10 DOA: 05/27/2023 DOS: the patient was seen and examined on 05/28/2023 PCP: Patient, No Pcp Per  Patient coming from: Home  Chief Complaint:  Chief Complaint  Patient presents with   Abdominal Pain   HPI: Kristina Scott is a 32 y.o. female with medical history significant of 18 days postpartum, high blood pressure affecting pregnancy, obesity, and more presents to the ED with a chief complaint of abdominal pain.  Patient reports the pain started on Tuesday.  It was all the way across the top of her abdomen.  Pain went through to her back as well.  The pain in her back was achy.  The pain in her abdomen is crampy.  She had no nausea or vomiting.  She has never had pain like this before.  She had no fever.  Patient's last bowel movement was on Tuesday.  Her last meal was Wednesday.  She reports she took Motrin and the pain was slightly better.  She has no history of gallbladder issues.  She is 18-19 days postpartum.  She had hypertension during pregnancy, for which she was taking blood pressure medication.  She had no preeclampsia with this pregnancy.  She had an uncomplicated vaginal delivery.  Her last pregnancy 8 years ago she did have preeclampsia.  Patient reports she was put on the hydrochlorothiazide for the swelling in her ankles.  Patient has no other complaints at this time.  Patient does not smoke, does not drink.  Patient is full code. Review of Systems: As mentioned in the history of present illness. All other systems reviewed and are negative. Past Medical History:  Diagnosis Date   Contraceptive management 12/28/2013   Encounter for Nexplanon removal 12/28/2013   High blood pressure affecting pregnancy in third trimester, delivered    Hypertension    Obesity    Pregnant state, incidental 08/23/2014   Past Surgical History:  Procedure Laterality Date   WISDOM TOOTH EXTRACTION     Social  History:  reports that she quit smoking about 15 years ago. Her smoking use included cigarettes. She has never used smokeless tobacco. She reports that she does not drink alcohol and does not use drugs.  No Known Allergies  Family History  Problem Relation Age of Onset   Stroke Maternal Grandfather     Prior to Admission medications   Medication Sig Start Date End Date Taking? Authorizing Provider  acetaminophen (TYLENOL) 325 MG tablet Take 2 tablets (650 mg total) by mouth every 6 (six) hours as needed (for pain scale < 4). 05/10/23   Myna Hidalgo, DO  furosemide (LASIX) 20 MG tablet Take 1 tablet (20 mg total) by mouth daily for 3 days. 05/15/23 05/18/23  Tobb, Kardie, DO  hydrochlorothiazide (MICROZIDE) 12.5 MG capsule Take 1 capsule (12.5 mg total) by mouth daily. 05/15/23 08/13/23  Tobb, Kardie, DO  ibuprofen (ADVIL) 600 MG tablet Take 1 tablet (600 mg total) by mouth every 6 (six) hours. 05/10/23   Myna Hidalgo, DO  labetalol (NORMODYNE) 200 MG tablet Take 4 tablets (800 mg total) by mouth 3 (three) times daily. 05/01/23   Myna Hidalgo, DO  NIFEdipine (ADALAT CC) 30 MG 24 hr tablet Take 1 tablet (30 mg total) by mouth at bedtime. 04/03/23   Tobb, Kardie, DO  NIFEdipine (ADALAT CC) 90 MG 24 hr tablet Take 1 tablet (90 mg total) by mouth daily. 04/03/23   Tobb, [place]ona [place], DO  prenatal vitamin w/FE,  FA (PRENATAL 1 + 1) 27-1 MG TABS tablet Take 1 tablet by mouth daily at 12 noon. 10/16/22   Adline Potter, NP    Physical Exam: Vitals:   05/28/23 0313 05/28/23 0315 05/28/23 0330 05/28/23 0400  BP:  118/76  114/76  Pulse:  75 68 66  Resp:  (!) 23 19 (!) 24  Temp: 97.8 F (36.6 C)     TempSrc: Oral     SpO2:  100% 100% 100%  Weight:      Height:       1.  General: Patient lying supine in bed,  no acute distress   2. Psychiatric: Alert and oriented x 3, mood and behavior normal for situation, pleasant and cooperative with exam   3. Neurologic: Speech and language are  normal, face is symmetric, moves all 4 extremities voluntarily, at baseline without acute deficits on limited exam   4. HEENMT:  Head is atraumatic, normocephalic, pupils reactive to light, neck is supple, trachea is midline, mucous membranes are moist   5. Respiratory : Lungs are clear to auscultation bilaterally without wheezing, rhonchi, rales, no cyanosis, no increase in work of breathing or accessory muscle use   6. Cardiovascular : Heart rate normal, rhythm is regular, no murmurs, rubs or gallops, no peripheral edema, peripheral pulses palpated   7. Gastrointestinal:  Abdomen is soft, nondistended, nontender to palpation at the time of my exam bowel sounds active, no masses or organomegaly palpated   8. Skin:  Skin is warm, dry and intact without rashes, acute lesions, or ulcers on limited exam   9.Musculoskeletal:  No acute deformities or trauma, no asymmetry in tone, no peripheral edema, peripheral pulses palpated, no tenderness to palpation in the extremities  Data Reviewed: In the ED Patient is afebrile, heart rate 73-76, respiratory rate 16, blood pressure 141/87-148/89, satting 97-100% No leukocytosis, hemoglobin 12.8 Chemistry unremarkable Alk phos 150, lipase almost 7000, AST 108, ALT 88 UA borderline, urine culture pending, denies urinary symptoms CT abdomen pelvis shows acute pancreatitis with possible noncalcified gallstones Ultrasound abdomen shows no acute cholecystitis OB consulted and advised that this was likely not related to her pregnancy Admission requested for acute pancreatitis  Assessment and Plan: * Acute pancreatitis - Lipase 6924 - CT abdomen shows acute pancreatitis with possible noncalcified gallstones - Ultrasound abdomen shows no acute cholecystitis - Given the transaminitis, acute pancreatitis, will check MRCP - Will also check lipid panel in the a.m. - Patient denies alcohol use - No new medications - Patient is 18 days postpartum - Pain  is well-controlled at this time - N.p.o. - Continue IV fluids - Continue to monitor  Transaminitis - AST up to 108 from 23, ALT up to 88 from 18 - Acute pancreatitis with a lipase of 6924 - CT abdomen shows acute pancreatitis with possible noncalcified gallstones - Ultrasound abdomen shows no acute cholecystitis - Given the transaminitis, acute pancreatitis, will check MRCP - Will also check lipid panel in the a.m. - Patient denies alcohol use - No new medications - Patient is 18 days postpartum - Pain is well-controlled at this time - N.p.o. - Continue IV fluids - Continue to monitor  Uncontrolled hypertension - Blood pressure of 248/89 - Continue hydrochlorothiazide, labetalol, nifedipine - Continue to monitor      Advance Care Planning:   Code Status: Full Code  Consults: Will be consulted from the ED  Family Communication: No family at bedside  Severity of Illness: The appropriate patient status for this  patient is INPATIENT. Inpatient status is judged to be reasonable and necessary in order to provide the required intensity of service to ensure the patient's safety. The patient's presenting symptoms, physical exam findings, and initial radiographic and laboratory data in the context of their chronic comorbidities is felt to place them at high risk for further clinical deterioration. Furthermore, it is not anticipated that the patient will be medically stable for discharge from the hospital within 2 midnights of admission.   * I certify that at the point of admission it is my clinical judgment that the patient will require inpatient hospital care spanning beyond 2 midnights from the point of admission due to high intensity of service, high risk for further deterioration and high frequency of surveillance required.*  Author: Lilyan Gilford, DO 05/28/2023 5:32 AM  For on call review [email or web address].

## 2023-05-28 NOTE — Assessment & Plan Note (Addendum)
-   Unknown etiology, postpartum 18 days,  -Imaging consistent with cholelithiasis with no signs of cholecystitis or obstruction - Acute pancreatitis with a lipase of 6924    Latest Ref Rng & Units 05/28/2023    3:11 AM 05/27/2023    4:45 PM 05/08/2023   10:33 AM  Hepatic Function  Total Protein 6.5 - 8.1 g/dL 8.1  8.5  6.6   Albumin 3.5 - 5.0 g/dL 3.8  4.0  2.6   AST 15 - 41 U/L 77  108  23   ALT 0 - 44 U/L 88  88  18   Alk Phosphatase 38 - 126 U/L 152  150  248   Total Bilirubin <1.2 mg/dL 0.8  1.3  0.5      - CT abdomen shows acute pancreatitis with possible noncalcified gallstones - Ultrasound abdomen shows no acute cholecystitis - MRCP:   - Patient denies alcohol use  - Continue IV fluids - Continue to monitor

## 2023-05-28 NOTE — ED Notes (Signed)
ED TO INPATIENT HANDOFF REPORT  ED Nurse Name and Phone #: Jennette Kettle [phone]  S Name/Age/Gender Kristina Scott 32 y.o. female Room/Bed: APA08/APA08  Code Status   Code Status: Full Code  Home/SNF/Other Home Patient oriented to: self, place, time, and situation Is this baseline? Yes   Triage Complete: Triage complete  Chief Complaint Acute pancreatitis [K85.90]  Triage Note Pt states she is 18 days postpartum and she is having upper abdominal pain radiating around to bilateral lower back;  Pt denies any n/v/d and states last BM was yesterday and normal  Pt denies any urinary sx or vaginal bleeding    Allergies No Known Allergies  Level of Care/Admitting Diagnosis ED Disposition     ED Disposition  Admit   Condition  --   Comment  Hospital Area: [facility] [[id number]]  Level of Care: Med-Surg [16]  Covid Evaluation: Asymptomatic - no recent exposure (last 10 days) testing not required  Diagnosis: Acute pancreatitis [577.0.ICD-9-CM]  Admitting Physician: Kendell Bane [[id number]]  Attending Physician: Kendell Bane [phone]  Certification:: I certify this patient will need inpatient services for at least 2 midnights          B Medical/Surgery History Past Medical History:  Diagnosis Date   Contraceptive management 12/28/2013   Encounter for Nexplanon removal 12/28/2013   High blood pressure affecting pregnancy in third trimester, delivered    Hypertension    Obesity    Pregnant state, incidental 08/23/2014   Past Surgical History:  Procedure Laterality Date   WISDOM TOOTH EXTRACTION       A IV Location/Drains/Wounds Patient Lines/Drains/Airways Status     Active Line/Drains/Airways     Name Placement date Placement time Site Days   Peripheral IV 05/27/23 22 G 1" Posterior;Right Hand 05/27/23  2110  Hand  1            Intake/Output Last 24 hours No intake or output data in the 24 hours ending 05/28/23  1344  Labs/Imaging Results for orders placed or performed during the hospital encounter of 05/27/23 (from the past 48 hour(s))  Lipase, blood     Status: Abnormal   Collection Time: 05/27/23  4:45 PM  Result Value Ref Range   Lipase 6,924 (H) 11 - 51 U/L    Comment: RESULTS CONFIRMED BY MANUAL DILUTION Performed at [facility], [address] [place], [place] [address]   Comprehensive metabolic panel     Status: Abnormal   Collection Time: 05/27/23  4:45 PM  Result Value Ref Range   Sodium 137 135 - 145 mmol/L   Potassium 4.0 3.5 - 5.1 mmol/L   Chloride 102 98 - 111 mmol/L   CO2 27 22 - 32 mmol/L   Glucose, Bld 93 70 - 99 mg/dL    Comment: Glucose reference range applies only to samples taken after fasting for at least 8 hours.   BUN 12 6 - 20 mg/dL   Creatinine, Ser 4.13 (H) 0.44 - 1.00 mg/dL   Calcium 9.9 8.9 - 24.4 mg/dL   Total Protein 8.5 (H) 6.5 - 8.1 g/dL   Albumin 4.0 3.5 - 5.0 g/dL   AST 010 (H) 15 - 41 U/L   ALT 88 (H) 0 - 44 U/L   Alkaline Phosphatase 150 (H) 38 - 126 U/L   Total Bilirubin 1.3 (H) <1.2 mg/dL   GFR, Estimated >27 >25 mL/min    Comment: (NOTE) Calculated using the CKD-EPI Creatinine Equation (2021)    Anion gap 8  5 - 15    Comment: Performed at [facility], [address] [place], [place] [address]  CBC     Status: None   Collection Time: 05/27/23  4:45 PM  Result Value Ref Range   WBC 8.7 4.0 - 10.5 K/uL   RBC 4.27 3.87 - 5.11 MIL/uL   Hemoglobin 12.8 12.0 - 15.0 g/dL   HCT 82.9 56.2 - 13.0 %   MCV 93.9 80.0 - 100.0 fL   MCH 30.0 26.0 - 34.0 pg   MCHC 31.9 30.0 - 36.0 g/dL   RDW 86.5 78.4 - 69.6 %   Platelets 332 150 - 400 K/uL   nRBC 0.0 0.0 - 0.2 %    Comment: Performed at [facility], [address] [place], [place] [address]  Urinalysis, Routine w reflex microscopic -Urine, Clean Catch     Status: Abnormal   Collection Time: 05/27/23  5:06 PM  Result Value Ref Range   Color, Urine AMBER (A) YELLOW    Comment: BIOCHEMICALS MAY BE  AFFECTED BY COLOR   APPearance HAZY (A) CLEAR   Specific Gravity, Urine 1.020 1.005 - 1.030   pH 5.0 5.0 - 8.0   Glucose, UA NEGATIVE NEGATIVE mg/dL   Hgb urine dipstick SMALL (A) NEGATIVE   Bilirubin Urine NEGATIVE NEGATIVE   Ketones, ur 5 (A) NEGATIVE mg/dL   Protein, ur 30 (A) NEGATIVE mg/dL   Nitrite NEGATIVE NEGATIVE   Leukocytes,Ua MODERATE (A) NEGATIVE   RBC / HPF 0-5 0 - 5 RBC/hpf   WBC, UA 11-20 0 - 5 WBC/hpf   Bacteria, UA RARE (A) NONE SEEN   Squamous Epithelial / HPF 11-20 0 - 5 /HPF   Mucus PRESENT     Comment: Performed at [facility], [address], [place], [place] [address]  Lipid panel     Status: Abnormal   Collection Time: 05/28/23  3:11 AM  Result Value Ref Range   Cholesterol 215 (H) 0 - 200 mg/dL   Triglycerides 39 <401 mg/dL   HDL 64 >02 mg/dL   Total CHOL/HDL Ratio 3.4 RATIO   VLDL 8 0 - 40 mg/dL   LDL Cholesterol 725 (H) 0 - 99 mg/dL    Comment:        Total Cholesterol/HDL:CHD Risk Coronary Heart Disease Risk Table                     Men   Women  1/2 Average Risk   3.4   3.3  Average Risk       5.0   4.4  2 X Average Risk   9.6   7.1  3 X Average Risk  23.4   11.0        Use the calculated Patient Ratio above and the CHD Risk Table to determine the patient's CHD Risk.        ATP III CLASSIFICATION (LDL):  <100     mg/dL   Optimal  366-440  mg/dL   Near or Above                    Optimal  130-159  mg/dL   Borderline  347-425  mg/dL   High  >956     mg/dL   Very High Performed at [facility], [address] [place], [place] [address]   Comprehensive metabolic panel     Status: Abnormal   Collection Time: 05/28/23  3:11 AM  Result Value Ref Range   Sodium  137 135 - 145 mmol/L   Potassium 3.5 3.5 - 5.1 mmol/L   Chloride 104 98 - 111 mmol/L   CO2 23 22 - 32 mmol/L   Glucose, Bld 95 70 - 99 mg/dL    Comment: Glucose reference range applies only to samples taken after fasting for at least 8 hours.   BUN 11 6 - 20 mg/dL   Creatinine,  Ser 1.61 0.44 - 1.00 mg/dL   Calcium 9.5 8.9 - 09.6 mg/dL   Total Protein 8.1 6.5 - 8.1 g/dL   Albumin 3.8 3.5 - 5.0 g/dL   AST 77 (H) 15 - 41 U/L   ALT 88 (H) 0 - 44 U/L   Alkaline Phosphatase 152 (H) 38 - 126 U/L   Total Bilirubin 0.8 <1.2 mg/dL   GFR, Estimated >04 >54 mL/min    Comment: (NOTE) Calculated using the CKD-EPI Creatinine Equation (2021)    Anion gap 10 5 - 15    Comment: Performed at [facility], [address], [place], [place] [address]  Magnesium     Status: None   Collection Time: 05/28/23  3:11 AM  Result Value Ref Range   Magnesium 2.1 1.7 - 2.4 mg/dL    Comment: Performed at [facility], [address], [place], [place] [address]  CBC with Differential/Platelet     Status: None   Collection Time: 05/28/23  3:11 AM  Result Value Ref Range   WBC 5.7 4.0 - 10.5 K/uL   RBC 3.98 3.87 - 5.11 MIL/uL   Hemoglobin 12.3 12.0 - 15.0 g/dL   HCT 29.5 62.1 - 30.8 %   MCV 92.7 80.0 - 100.0 fL   MCH 30.9 26.0 - 34.0 pg   MCHC 33.3 30.0 - 36.0 g/dL   RDW 65.7 84.6 - 96.2 %   Platelets 293 150 - 400 K/uL   nRBC 0.0 0.0 - 0.2 %   Neutrophils Relative % 65 %   Neutro Abs 3.8 1.7 - 7.7 K/uL   Lymphocytes Relative 29 %   Lymphs Abs 1.6 0.7 - 4.0 K/uL   Monocytes Relative 5 %   Monocytes Absolute 0.3 0.1 - 1.0 K/uL   Eosinophils Relative 1 %   Eosinophils Absolute 0.0 0.0 - 0.5 K/uL   Basophils Relative 0 %   Basophils Absolute 0.0 0.0 - 0.1 K/uL   Immature Granulocytes 0 %   Abs Immature Granulocytes 0.00 0.00 - 0.07 K/uL    Comment: Performed at [facility], [address] [place], [place] [address]  Lipase, blood     Status: Abnormal   Collection Time: 05/28/23  3:11 AM  Result Value Ref Range   Lipase 1,609 (H) 11 - 51 U/L    Comment: RESULTS CONFIRMED BY MANUAL DILUTION Performed at [facility], [address] [place], [place] [address]   TSH     Status: None   Collection Time: 05/28/23  3:11 AM  Result Value Ref Range   TSH 1.995 0.350 - 4.500 uIU/mL     Comment: Performed by a 3rd Generation assay with a functional sensitivity of <=0.01 uIU/mL. Performed at [facility], [address], [place], [place] [address]    MR ABDOMEN MRCP W WO CONTAST  Result Date: 05/28/2023 CLINICAL DATA:  Severe pancreatitis, recently postpartum EXAM: MRI ABDOMEN WITHOUT AND WITH CONTRAST (INCLUDING MRCP) TECHNIQUE: Multiplanar multisequence MR imaging of the abdomen was performed both before and after the administration of intravenous contrast. Heavily T2-weighted images of the biliary and pancreatic ducts were  obtained, and three-dimensional MRCP images were rendered by post processing. CONTRAST:  9mL GADAVIST GADOBUTROL 1 MMOL/ML IV SOLN COMPARISON:  CT abdomen pelvis, 05/27/2023 FINDINGS: Lower chest: No acute abnormality. Hepatobiliary: No solid liver abnormality is seen. Hepatomegaly, maximum coronal span 22.5 cm. Layering sludge and gravel in the gallbladder. No biliary ductal dilatation or evidence of choledocholithiasis (series 3, image 14). Pancreas: Diffuse inflammatory fat stranding and fluid about the pancreas. No pancreatic parenchymal hypoenhancement or acute pancreatic fluid collection. No pancreatic ductal dilatation. Spleen: Normal in size without significant abnormality. Adrenals/Urinary Tract: Adrenal glands are unremarkable. Kidneys are normal, without renal calculi, solid lesion, or hydronephrosis. Stomach/Bowel: Stomach is within normal limits. No evidence of bowel wall thickening, distention, or inflammatory changes. Vascular/Lymphatic: No significant vascular findings are present. No enlarged abdominal lymph nodes. Other: No abdominal wall hernia or abnormality. No ascites. Musculoskeletal: No acute or significant osseous findings. IMPRESSION: 1. Diffuse inflammatory fat stranding and fluid about the pancreas, consistent with acute pancreatitis. No no evidence of complicating parenchymal necrosis or acute pancreatic fluid collection. 2. Layering sludge and  gravel in the gallbladder. No biliary ductal dilatation or evidence of choledocholithiasis. 3. Hepatomegaly. Electronically Signed   By: Jearld Lesch M.D.   On: 05/28/2023 12:49   MR 3D Recon At Scanner  Result Date: 05/28/2023 CLINICAL DATA:  Severe pancreatitis, recently postpartum EXAM: MRI ABDOMEN WITHOUT AND WITH CONTRAST (INCLUDING MRCP) TECHNIQUE: Multiplanar multisequence MR imaging of the abdomen was performed both before and after the administration of intravenous contrast. Heavily T2-weighted images of the biliary and pancreatic ducts were obtained, and three-dimensional MRCP images were rendered by post processing. CONTRAST:  9mL GADAVIST GADOBUTROL 1 MMOL/ML IV SOLN COMPARISON:  CT abdomen pelvis, 05/27/2023 FINDINGS: Lower chest: No acute abnormality. Hepatobiliary: No solid liver abnormality is seen. Hepatomegaly, maximum coronal span 22.5 cm. Layering sludge and gravel in the gallbladder. No biliary ductal dilatation or evidence of choledocholithiasis (series 3, image 14). Pancreas: Diffuse inflammatory fat stranding and fluid about the pancreas. No pancreatic parenchymal hypoenhancement or acute pancreatic fluid collection. No pancreatic ductal dilatation. Spleen: Normal in size without significant abnormality. Adrenals/Urinary Tract: Adrenal glands are unremarkable. Kidneys are normal, without renal calculi, solid lesion, or hydronephrosis. Stomach/Bowel: Stomach is within normal limits. No evidence of bowel wall thickening, distention, or inflammatory changes. Vascular/Lymphatic: No significant vascular findings are present. No enlarged abdominal lymph nodes. Other: No abdominal wall hernia or abnormality. No ascites. Musculoskeletal: No acute or significant osseous findings. IMPRESSION: 1. Diffuse inflammatory fat stranding and fluid about the pancreas, consistent with acute pancreatitis. No no evidence of complicating parenchymal necrosis or acute pancreatic fluid collection. 2. Layering  sludge and gravel in the gallbladder. No biliary ductal dilatation or evidence of choledocholithiasis. 3. Hepatomegaly. Electronically Signed   By: Jearld Lesch M.D.   On: 05/28/2023 12:49   CT ABDOMEN PELVIS W CONTRAST  Result Date: 05/27/2023 CLINICAL DATA:  Right upper quadrant abdominal pain, elevated LFTs, and lipase. 18 days postpartum. EXAM: CT ABDOMEN AND PELVIS WITH CONTRAST TECHNIQUE: Multidetector CT imaging of the abdomen and pelvis was performed using the standard protocol following bolus administration of intravenous contrast. RADIATION DOSE REDUCTION: This exam was performed according to the departmental dose-optimization program which includes automated exposure control, adjustment of the mA and/or kV according to patient size and/or use of iterative reconstruction technique. CONTRAST:  OMNIPAQUE IOHEXOL 300 MG/ML  SOLN COMPARISON:  None Available. FINDINGS: Lower chest: Lung bases are clear. Hepatobiliary: Liver is within normal limits. Suspected small noncalcified gallstones (  series 2/image 40), without associated inflammatory changes. No intrahepatic or extrahepatic duct dilatation. Pancreas: Peripancreatic inflammatory changes/fluid, reflecting acute pancreatitis. No walled-off necrosis. Spleen: Within normal limits. Adrenals/Urinary Tract: Adrenal glands are within normal limits. Kidneys are within normal limits.  No hydronephrosis. Bladder is within normal limits. Stomach/Bowel: Stomach is within normal limits. No evidence of bowel obstruction. Normal appendix (series 2/image 62). No colonic wall thickening or inflammatory changes. Vascular/Lymphatic: No evidence of abdominal aortic aneurysm. No suspicious abdominopelvic lymphadenopathy. Reproductive: Uterus is within normal limits. Bilateral ovaries are within normal limits. Other: No abdominopelvic ascites. Musculoskeletal: Visualized osseous structures are within normal limits. IMPRESSION: Acute pancreatitis. No walled-off  necrosis. Suspected small noncalcified gallstones, without associated inflammatory changes. No intrahepatic or extrahepatic ductal dilatation. Electronically Signed   By: Charline Bills M.D.   On: 05/27/2023 22:09   US Abdomen Limited  Result Date: 05/27/2023 CLINICAL DATA:  Right upper quadrant pain EXAM: ULTRASOUND ABDOMEN LIMITED RIGHT UPPER QUADRANT COMPARISON:  None Available. FINDINGS: Gallbladder: Multiple mobile gallstones measuring up to 8 mm. No wall thickening or sonographic Murphy sign. Common bile duct: Diameter: Normal caliber, 2 mm Liver: No focal lesion identified. Within normal limits in parenchymal echogenicity. Portal vein is patent on color Doppler imaging with normal direction of blood flow towards the liver. Other: None. IMPRESSION: Cholelithiasis.  No sonographic evidence of acute cholecystitis. Electronically Signed   By: Charlett Nose M.D.   On: 05/27/2023 19:30    Pending Labs Unresulted Labs (From admission, onward)     Start     Ordered   05/29/23 0500  Comprehensive metabolic panel  Daily,   R      05/28/23 0752   05/28/23 1037  Hepatitis panel, acute  Add-on,   AD        05/28/23 1036   05/27/23 2005  Urine Culture  Add-on,   AD       Question:  Indication  Answer:  Dysuria   05/27/23 2004            Vitals/Pain Today's Vitals   05/28/23 0930 05/28/23 1150 05/28/23 1250 05/28/23 1342  BP: 113/74 118/85    Pulse: 68 71 75   Resp:  19 18   Temp:    98.3 F (36.8 C)  TempSrc:    Oral  SpO2: 99% 98% 100%   Weight:      Height:      PainSc:        Isolation Precautions No active isolations  Medications Medications  prenatal vitamin w/FE, FA (PRENATAL 1 + 1) 27-1 MG tablet 1 tablet (has no administration in time range)  heparin injection 5,000 Units (5,000 Units Subcutaneous Given 05/28/23 0553)  acetaminophen (TYLENOL) tablet 650 mg (has no administration in time range)    Or  acetaminophen (TYLENOL) suppository 650 mg (has no administration  in time range)  oxyCODONE (Oxy IR/ROXICODONE) immediate release tablet 5 mg (5 mg Oral Given 05/28/23 0304)  ondansetron (ZOFRAN) tablet 4 mg ( Oral See Alternative 05/28/23 0304)    Or  ondansetron (ZOFRAN) injection 4 mg (4 mg Intravenous Given 05/28/23 0304)  hydrochlorothiazide (HYDRODIURIL) tablet 25 mg (25 mg Oral Given 05/28/23 0927)  lactated ringers infusion ( Intravenous New Bag/Given 05/28/23 0828)  labetalol (NORMODYNE) tablet 400 mg (400 mg Oral Given 05/28/23 0928)  hydrALAZINE (APRESOLINE) injection 10 mg (has no administration in time range)  NIFEdipine (PROCARDIA-XL/NIFEDICAL-XL) 24 hr tablet 90 mg (90 mg Oral Given 05/28/23 0928)  multivitamin with minerals tablet 1 tablet (1  tablet Oral Given 05/28/23 1144)  iohexol (OMNIPAQUE) 300 MG/ML solution 100 mL (100 mLs Intravenous Contrast Given 05/27/23 2136)  gadobutrol (GADAVIST) 1 MMOL/ML injection 9 mL (9 mLs Intravenous Contrast Given 05/28/23 1126)    Mobility walks     Focused Assessments pancreatitis   R Recommendations: See Admitting Provider Note  Report given to:   Additional Notes: A & O times 4, ambulatory.

## 2023-05-28 NOTE — Hospital Course (Addendum)
Kristina Scott is a 32 y.o. female with medical history significant of 18 days postpartum, high blood pressure affecting pregnancy, obesity, and more presents to the ED with a chief complaint of abdominal pain.  Patient reports the pain started on Tuesday.  It was all the way across the top of her abdomen.  Pain went through to her back as well.  The pain in her back was achy.  The pain in her abdomen is crampy.  She had no nausea or vomiting.  She has never had pain like this before.  She had no fever.  Patient's last bowel movement was on Tuesday.  Her last meal was Wednesday.  She reports she took Motrin and the pain was slightly better.  She has no history of gallbladder issues.  She is 18-19 days postpartum.  She had hypertension during pregnancy, for which she was taking blood pressure medication.  She had no preeclampsia with this pregnancy.  She had an uncomplicated vaginal delivery.  Her last pregnancy 8 years ago she did have preeclampsia.  Patient reports she was put on the hydrochlorothiazide for the swelling in her ankles.  Patient has no other complaints at this time.   ED Patient is afebrile, heart rate 73-76, respiratory rate 16, blood pressure 141/87-148/89, satting 97-100% No leukocytosis, hemoglobin 12.8 Chemistry unremarkable Alk phos 150, lipase almost 7000, AST 108, ALT 88 UA borderline, urine culture pending, denies urinary symptoms CT abdomen pelvis shows acute pancreatitis with possible noncalcified gallstones Ultrasound abdomen shows no acute cholecystitis OB consulted and advised that this was likely not related to her pregnancy Admission requested for acute pancreatitis

## 2023-05-28 NOTE — Progress Notes (Signed)
   05/28/23 1036  TOC Brief Assessment  Insurance and Status Reviewed  Patient has primary care physician No  Home environment has been reviewed from home  Prior level of function: independent  Prior/Current Home Services No current home services  Social Determinants of Health Reivew SDOH reviewed no interventions necessary  Readmission risk has been reviewed Yes  Transition of care needs no transition of care needs at this time    PCP list added to AVS.  Transition of Care Department (TOC) has reviewed patient and no TOC needs have been identified at this time. We will continue to monitor patient advancement through interdisciplinary progression rounds. If new patient transition needs arise, please place a TOC consult.

## 2023-05-28 NOTE — Assessment & Plan Note (Addendum)
-   Lipase 6924 >>>>  1609 - CT abdomen shows acute pancreatitis with possible noncalcified gallstones - Ultrasound abdomen shows no acute cholecystitis - Given the transaminitis, acute pancreatitis, will check MRCP -pending - Lipid panel: TG normal at 39, LDL 143 - Patient denies alcohol use - No new medications - Patient is 18 days postpartum - Pain is well-controlled at this time - N.p.o. - Continue IV fluids - Continue to monitor

## 2023-05-28 NOTE — ED Notes (Signed)
Pt at MRI at the moment. When she returns, will administer medications.

## 2023-05-28 NOTE — Assessment & Plan Note (Addendum)
-   Blood pressure of 248/89 - Continue hydrochlorothiazide, labetalol, nifedipine Modifying above medication for optimal BP control -HCTZ 25 mg p.o. daily -Labetalol 800 mg reduced to 400 mg p.o. 3 times daily -Nifedipine XL 90 mg daily  - Continue to monitor

## 2023-05-28 NOTE — Progress Notes (Addendum)
PROGRESS NOTE    Patient: Kristina Scott                            PCP: Patient, No Pcp Per                    DOB: 18-Sep-1990            DOA: 05/27/2023 UEA:[id number]             DOS: 05/28/2023, 10:36 AM   LOS: 0 days   Date of Service: The patient was seen and examined on 05/28/2023  Subjective:   The patient was seen and examined this morning. Hemodynamically stable... Blood pressures improved No issues overnight .  Brief Narrative:    Kristina Scott is a 32 y.o. female with medical history significant of 18 days postpartum, high blood pressure affecting pregnancy, obesity, and more presents to the ED with a chief complaint of abdominal pain.  Patient reports the pain started on Tuesday.  It was all the way across the top of her abdomen.  Pain went through to her back as well.  The pain in her back was achy.  The pain in her abdomen is crampy.  She had no nausea or vomiting.  She has never had pain like this before.  She had no fever.  Patient's last bowel movement was on Tuesday.  Her last meal was Wednesday.  She reports she took Motrin and the pain was slightly better.  She has no history of gallbladder issues.  She is 18-19 days postpartum.  She had hypertension during pregnancy, for which she was taking blood pressure medication.  She had no preeclampsia with this pregnancy.  She had an uncomplicated vaginal delivery.  Her last pregnancy 8 years ago she did have preeclampsia.  Patient reports she was put on the hydrochlorothiazide for the swelling in her ankles.  Patient has no other complaints at this time.   ED Patient is afebrile, heart rate 73-76, respiratory rate 16, blood pressure 141/87-148/89, satting 97-100% No leukocytosis, hemoglobin 12.8 Chemistry unremarkable Alk phos 150, lipase almost 7000, AST 108, ALT 88 UA borderline, urine culture pending, denies urinary symptoms CT abdomen pelvis shows acute pancreatitis with possible noncalcified gallstones Ultrasound  abdomen shows no acute cholecystitis OB consulted and advised that this was likely not related to her pregnancy Admission requested for acute pancreatitis    Assessment & Plan:   Principal Problem:   Acute pancreatitis Active Problems:   Uncontrolled hypertension   Transaminitis   Hyperlipidemia     Assessment and Plan: * Acute pancreatitis - Lipase 6924 >>>>  1609 - CT abdomen shows acute pancreatitis with possible noncalcified gallstones - Ultrasound abdomen shows no acute cholecystitis - Given the transaminitis, acute pancreatitis, will check MRCP -pending - Lipid panel: TG normal at 39, LDL 143 - Patient denies alcohol use - No new medications - Patient is 18 days postpartum - Pain is well-controlled at this time - N.p.o. - Continue IV fluids - Continue to monitor  Hyperlipidemia Lipid Panel     Component Value Date/Time   CHOL 215 (H) 05/28/2023 0311   TRIG 39 05/28/2023 0311   HDL 64 05/28/2023 0311   CHOLHDL 3.4 05/28/2023 0311   VLDL 8 05/28/2023 0311   LDLCALC 143 (H) 05/28/2023 0311   -Discussed with patient regarding healthy diet exercise, weight loss -Avoiding statin at this time due to acute Transaminitis  Transaminitis -  Unknown etiology, postpartum 18 days,  -Imaging consistent with cholelithiasis with no signs of cholecystitis or obstruction - Acute pancreatitis with a lipase of 6924    Latest Ref Rng & Units 05/28/2023    3:11 AM 05/27/2023    4:45 PM 05/08/2023   10:33 AM  Hepatic Function  Total Protein 6.5 - 8.1 g/dL 8.1  8.5  6.6   Albumin 3.5 - 5.0 g/dL 3.8  4.0  2.6   AST 15 - 41 U/L 77  108  23   ALT 0 - 44 U/L 88  88  18   Alk Phosphatase 38 - 126 U/L 152  150  248   Total Bilirubin <1.2 mg/dL 0.8  1.3  0.5      - CT abdomen shows acute pancreatitis with possible noncalcified gallstones - Ultrasound abdomen shows no acute cholecystitis - MRCP:   - Patient denies alcohol use  - Continue IV fluids - Continue to  monitor  Uncontrolled hypertension - Blood pressure of 248/89 - Continue hydrochlorothiazide, labetalol, nifedipine Modifying above medication for optimal BP control -HCTZ 25 mg p.o. daily -Labetalol 800 mg reduced to 400 mg p.o. 3 times daily -Nifedipine XL 90 mg daily  - Continue to monitor        -----------------------------------------------------------------------------------------------------------------------  DVT prophylaxis:  heparin injection 5,000 Units Start: 05/28/23 0600 SCDs Start: 05/28/23 0234   Code Status:   Code Status: Full Code  Family Communication: No family member present at bedside- attempt will be made to update daily The above findings and plan of care has been discussed with patient (and family)  in detail,  they expressed understanding and agreement of above. -Advance care planning has been discussed.   Admission status:   Status is: Inpatient Remains inpatient appropriate because: Needing treatment for acute pancreatitis, IV fluids, adjustment of current medication for accelerated hypertension   Disposition: From  - home             Planning for discharge in 1-2 days: to   Procedures:   No admission procedures for hospital encounter.   Antimicrobials:  Anti-infectives (From admission, onward)    None        Medication:   heparin  5,000 Units Subcutaneous Q8H   hydrochlorothiazide  25 mg Oral Daily   labetalol  400 mg Oral TID   multivitamin with minerals  1 tablet Oral Daily   NIFEdipine  90 mg Oral Daily   prenatal vitamin w/FE, FA  1 tablet Oral Q1200    acetaminophen **OR** acetaminophen, hydrALAZINE, ondansetron **OR** ondansetron (ZOFRAN) IV, oxyCODONE   Objective:   Vitals:   05/28/23 0828 05/28/23 0830 05/28/23 0928 05/28/23 0930  BP: 136/79 (!) 135/94 114/65 113/74  Pulse: 70 72 69 68  Resp: 20     Temp: 98.1 F (36.7 C)     TempSrc: Oral     SpO2: 99% 99%  99%  Weight:      Height:       No intake  or output data in the 24 hours ending 05/28/23 1036 Filed Weights   05/27/23 1633  Weight: (!) 171 kg     Physical examination:   Constitution:  Alert, cooperative, no distress,  Appears calm and comfortable  Psychiatric:   Normal and stable mood and affect, cognition intact,   HEENT:        Normocephalic, PERRL, otherwise with in Normal limits  Chest:         Chest symmetric Cardio vascular:  S1/S2,  RRR, No murmure, No Rubs or Gallops  pulmonary: Clear to auscultation bilaterally, respirations unlabored, negative wheezes / crackles Abdomen: Soft, non-tender, non-distended, bowel sounds,no masses, no organomegaly Muscular skeletal: Limited exam - in bed, able to move all 4 extremities,   Neuro: CNII-XII intact. , normal motor and sensation, reflexes intact  Extremities: No pitting edema lower extremities, +2 pulses  Skin: Dry, warm to touch, negative for any Rashes, No open wounds Wounds: per nursing documentation   ------------------------------------------------------------------------------------------------------------------------------------------    LABs:     Latest Ref Rng & Units 05/28/2023    3:11 AM 05/27/2023    4:45 PM 05/09/2023    4:08 AM  CBC  WBC 4.0 - 10.5 K/uL 5.7  8.7  12.7   Hemoglobin 12.0 - 15.0 g/dL 96.0  45.4  09.8   Hematocrit 36.0 - 46.0 % 36.9  40.1  31.3   Platelets 150 - 400 K/uL 293  332  271       Latest Ref Rng & Units 05/28/2023    3:11 AM 05/27/2023    4:45 PM 05/08/2023   10:33 AM  CMP  Glucose 70 - 99 mg/dL 95  93  89   BUN 6 - 20 mg/dL 11  12  5    Creatinine 0.44 - 1.00 mg/dL 1.19  1.47  8.29   Sodium 135 - 145 mmol/L 137  137  135   Potassium 3.5 - 5.1 mmol/L 3.5  4.0  3.8   Chloride 98 - 111 mmol/L 104  102  105   CO2 22 - 32 mmol/L 23  27  20    Calcium 8.9 - 10.3 mg/dL 9.5  9.9  9.4   Total Protein 6.5 - 8.1 g/dL 8.1  8.5  6.6   Total Bilirubin <1.2 mg/dL 0.8  1.3  0.5   Alkaline Phos 38 - 126 U/L 152  150  248   AST 15 - 41  U/L 77  108  23   ALT 0 - 44 U/L 88  88  18        Micro Results No results found for this or any previous visit (from the past 240 hour(s)).  Radiology Reports CT ABDOMEN PELVIS W CONTRAST  Result Date: 05/27/2023 CLINICAL DATA:  Right upper quadrant abdominal pain, elevated LFTs, and lipase. 18 days postpartum. EXAM: CT ABDOMEN AND PELVIS WITH CONTRAST TECHNIQUE: Multidetector CT imaging of the abdomen and pelvis was performed using the standard protocol following bolus administration of intravenous contrast. RADIATION DOSE REDUCTION: This exam was performed according to the departmental dose-optimization program which includes automated exposure control, adjustment of the mA and/or kV according to patient size and/or use of iterative reconstruction technique. CONTRAST:  OMNIPAQUE IOHEXOL 300 MG/ML  SOLN COMPARISON:  None Available. FINDINGS: Lower chest: Lung bases are clear. Hepatobiliary: Liver is within normal limits. Suspected small noncalcified gallstones (series 2/image 40), without associated inflammatory changes. No intrahepatic or extrahepatic duct dilatation. Pancreas: Peripancreatic inflammatory changes/fluid, reflecting acute pancreatitis. No walled-off necrosis. Spleen: Within normal limits. Adrenals/Urinary Tract: Adrenal glands are within normal limits. Kidneys are within normal limits.  No hydronephrosis. Bladder is within normal limits. Stomach/Bowel: Stomach is within normal limits. No evidence of bowel obstruction. Normal appendix (series 2/image 62). No colonic wall thickening or inflammatory changes. Vascular/Lymphatic: No evidence of abdominal aortic aneurysm. No suspicious abdominopelvic lymphadenopathy. Reproductive: Uterus is within normal limits. Bilateral ovaries are within normal limits. Other: No abdominopelvic ascites. Musculoskeletal: Visualized osseous structures are within normal limits. IMPRESSION: Acute pancreatitis.  No walled-off necrosis. Suspected small  noncalcified gallstones, without associated inflammatory changes. No intrahepatic or extrahepatic ductal dilatation. Electronically Signed   By: Charline Bills M.D.   On: 05/27/2023 22:09   US Abdomen Limited  Result Date: 05/27/2023 CLINICAL DATA:  Right upper quadrant pain EXAM: ULTRASOUND ABDOMEN LIMITED RIGHT UPPER QUADRANT COMPARISON:  None Available. FINDINGS: Gallbladder: Multiple mobile gallstones measuring up to 8 mm. No wall thickening or sonographic Murphy sign. Common bile duct: Diameter: Normal caliber, 2 mm Liver: No focal lesion identified. Within normal limits in parenchymal echogenicity. Portal vein is patent on color Doppler imaging with normal direction of blood flow towards the liver. Other: None. IMPRESSION: Cholelithiasis.  No sonographic evidence of acute cholecystitis. Electronically Signed   By: Charlett Nose M.D.   On: 05/27/2023 19:30    SIGNED: Kendell Bane, MD, FHM. FAAFP. [facility] - [facility] Time spent - 55 min.  In seeing, evaluating and examining the patient. Reviewing medical records, labs, drawn plan of care. [facility],  Pager (please use [email or web address] to page/ text) Please use Epic Secure Chat for non-urgent communication (7AM-7PM)  If 7PM-7AM, please contact night-coverage [email or web address], 05/28/2023, 10:36 AM

## 2023-05-28 NOTE — Plan of Care (Signed)

## 2023-05-28 NOTE — Assessment & Plan Note (Signed)
Lipid Panel     Component Value Date/Time   CHOL 215 (H) 05/28/2023 0311   TRIG 39 05/28/2023 0311   HDL 64 05/28/2023 0311   CHOLHDL 3.4 05/28/2023 0311   VLDL 8 05/28/2023 0311   LDLCALC 143 (H) 05/28/2023 0311   -Discussed with patient regarding healthy diet exercise, weight loss -Avoiding statin at this time due to acute Transaminitis

## 2023-05-28 NOTE — ED Notes (Signed)
Pt to MRI

## 2023-05-28 NOTE — ED Notes (Signed)
Patient transported to MRI 

## 2023-05-29 DIAGNOSIS — K859 Acute pancreatitis without necrosis or infection, unspecified: Secondary | ICD-10-CM | POA: Diagnosis not present

## 2023-05-29 LAB — URINE CULTURE

## 2023-05-29 LAB — COMPREHENSIVE METABOLIC PANEL
ALT: 60 U/L — ABNORMAL HIGH (ref 0–44)
AST: 31 U/L (ref 15–41)
Albumin: 3.6 g/dL (ref 3.5–5.0)
Alkaline Phosphatase: 139 U/L — ABNORMAL HIGH (ref 38–126)
Anion gap: 11 (ref 5–15)
BUN: 11 mg/dL (ref 6–20)
CO2: 23 mmol/L (ref 22–32)
Calcium: 9.4 mg/dL (ref 8.9–10.3)
Chloride: 103 mmol/L (ref 98–111)
Creatinine, Ser: 0.94 mg/dL (ref 0.44–1.00)
GFR, Estimated: >60 mL/min (ref >60)
Glucose, Bld: 70 mg/dL (ref 70–99)
Potassium: 3.5 mmol/L (ref 3.5–5.1)
Sodium: 137 mmol/L (ref 135–145)
Total Bilirubin: 0.9 mg/dL (ref <1.2)
Total Protein: 7.6 g/dL (ref 6.5–8.1)

## 2023-05-29 LAB — LIPASE, BLOOD: Lipase: 158 U/L — ABNORMAL HIGH (ref 11–51)

## 2023-05-29 MED ORDER — LABETALOL HCL 200 MG PO TABS: 400.0000 mg | ORAL_TABLET | Freq: Three times a day (TID) | ORAL | 1 refills | Status: AC

## 2023-05-29 MED ORDER — LISINOPRIL 10 MG PO TABS: 40.0000 mg | ORAL_TABLET | Freq: Every day | ORAL | Status: DC

## 2023-05-29 MED ORDER — LISINOPRIL 40 MG PO TABS: 20.0000 mg | ORAL_TABLET | Freq: Every day | ORAL | 2 refills | Status: DC

## 2023-05-29 MED ORDER — PRENATAL PLUS 27-1 MG PO TABS: 1.0000 | ORAL_TABLET | Freq: Every day | ORAL | 12 refills | Status: AC

## 2023-05-29 NOTE — Plan of Care (Signed)

## 2023-05-29 NOTE — Discharge Summary (Signed)
Physician Discharge Summary   Patient: Kristina Scott MRN: [id number] DOB: 1990/09/11  Admit date:     05/27/2023  Discharge date: 05/29/23  Discharge Physician: Kendell Bane   PCP: Patient, No Pcp Per   Recommendations at discharge:  -Follow-up with PCP in 1-2 weeks -Avoid medications such as chlorothiazide-diuretics (may be the cause of your pancreatitis) -Current medication needs to be titrated for better blood pressure control -Monitor your blood pressure 2-3 times a day keep a log presented to your PCP -Aggressive weight loss healthy diet and exercise recommended -Recommending repeating Labs, monitoring liver function,  Labs: Liver function (CMP), fasting lipid panel in 6-8 weeks  Discharge Diagnoses: Principal Problem:   Acute pancreatitis Active Problems:   Uncontrolled hypertension   Transaminitis   Hyperlipidemia  Resolved Problems:   * No resolved hospital problems. *  Hospital Course:  Kristina Scott is a 32 y.o. female with medical history significant of 18 days postpartum, high blood pressure affecting pregnancy, obesity, and more presents to the ED with a chief complaint of abdominal pain.  Patient reports the pain started on Tuesday.  It was all the way across the top of her abdomen.  Pain went through to her back as well.  The pain in her back was achy.  The pain in her abdomen is crampy.  She had no nausea or vomiting.  She has never had pain like this before.  She had no fever.  Patient's last bowel movement was on Tuesday.  Her last meal was Wednesday.  She reports she took Motrin and the pain was slightly better.  She has no history of gallbladder issues.  She is 18-19 days postpartum.  She had hypertension during pregnancy, for which she was taking blood pressure medication.  She had no preeclampsia with this pregnancy.  She had an uncomplicated vaginal delivery.  Her last pregnancy 8 years ago she did have preeclampsia.  Patient reports she was put on  the hydrochlorothiazide for the swelling in her ankles.  Patient has no other complaints at this time.   ED Patient is afebrile, heart rate 73-76, respiratory rate 16, blood pressure 141/87-148/89, satting 97-100% No leukocytosis, hemoglobin 12.8 Chemistry unremarkable Alk phos 150, lipase almost 7000, AST 108, ALT 88 UA borderline, urine culture pending, denies urinary symptoms CT abdomen pelvis shows acute pancreatitis with possible noncalcified gallstones Ultrasound abdomen shows no acute cholecystitis OB consulted and advised that this was likely not related to her pregnancy Admission requested for acute pancreatitis  Assessment and Plan: * Acute pancreatitis - Lipase 6924 >>>>  1609 >>> 158  - CT abdomen shows acute pancreatitis with possible noncalcified gallstones - Ultrasound abdomen shows no acute cholecystitis - Given the transaminitis, acute pancreatitis, will check MRCP -pending - Lipid panel: TG normal at 39, LDL 143 - Patient denies alcohol use - No new medications - Patient is 18 days postpartum - Pain is well-controlled at this time Status post IV fluid hydration-responded well  Possible cause of acute pancreatitis hydrochlorothiazide -no other underlying etiology identified in this patient for: Pancreatitis  Hyperlipidemia Lipid Panel     Component Value Date/Time   CHOL 215 (H) 05/28/2023 0311   TRIG 39 05/28/2023 0311   HDL 64 05/28/2023 0311   CHOLHDL 3.4 05/28/2023 0311   VLDL 8 05/28/2023 0311   LDLCALC 143 (H) 05/28/2023 0311   -Discussed with patient regarding healthy diet exercise, weight loss -Avoiding statin at this time due to acute Transaminitis  Transaminitis - Unknown  etiology, postpartum 18 days,  -Imaging consistent with cholelithiasis with no signs of cholecystitis or obstruction - Acute pancreatitis with a lipase of 6924    Latest Ref Rng & Units 05/29/2023    4:11 AM 05/28/2023    3:11 AM 05/27/2023    4:45 PM  Hepatic Function   Total Protein 6.5 - 8.1 g/dL 7.6  8.1  8.5   Albumin 3.5 - 5.0 g/dL 3.6  3.8  4.0   AST 15 - 41 U/L 31  77  108   ALT 0 - 44 U/L 60  88  88   Alk Phosphatase 38 - 126 U/L 139  152  150   Total Bilirubin <1.2 mg/dL 0.9  0.8  1.3     - CT abdomen shows acute pancreatitis with possible noncalcified gallstones - Ultrasound abdomen shows no acute cholecystitis  - MRCP: IMPRESSION: 1. Diffuse inflammatory fat stranding and fluid about the pancreas, consistent with acute pancreatitis. No no evidence of complicating parenchymal necrosis or acute pancreatic fluid collection. 2. Layering sludge and gravel in the gallbladder. No biliary ductal dilatation or evidence of choledocholithiasis. 3. Hepatomegaly.  - Patient denies alcohol use   Uncontrolled hypertension - Blood pressure of 248/89 - Home medication: hydrochlorothiazide, labetalol, nifedipine Modifying above medication for optimal BP control -HCTZ >> Discontinued  -Labetalol 800 mg reduced to 400 mg p.o. 3 times daily -Nifedipine XL 90 mg daily, with 30 mL nightly - Lisinopril 20 mg p.o. daily and  - Continue to monitor   Procedures performed: CT/MRCP Disposition: Home Diet recommendation:  Discharge Diet Orders (From admission, onward)     Start     Ordered   05/29/23 0000  Diet - low sodium heart healthy        05/29/23 1056           Cardiac diet DISCHARGE MEDICATION: Allergies as of 05/29/2023   No Known Allergies      Medication List     STOP taking these medications    hydrochlorothiazide 12.5 MG capsule Commonly known as: MICROZIDE       TAKE these medications    acetaminophen 325 MG tablet Commonly known as: Tylenol Take 2 tablets (650 mg total) by mouth every 6 (six) hours as needed (for pain scale < 4).   furosemide 20 MG tablet Commonly known as: LASIX Take 1 tablet (20 mg total) by mouth daily for 3 days.   ibuprofen 600 MG tablet Commonly known as: ADVIL Take 1 tablet (600 mg  total) by mouth every 6 (six) hours.   labetalol 200 MG tablet Commonly known as: NORMODYNE Take 2 tablets (400 mg total) by mouth 3 (three) times daily. What changed: how much to take   lisinopril 40 MG tablet Commonly known as: ZESTRIL Take 0.5 tablets (20 mg total) by mouth daily.   NIFEdipine 90 MG 24 hr tablet Commonly known as: ADALAT CC Take 1 tablet (90 mg total) by mouth daily.   NIFEdipine 30 MG 24 hr tablet Commonly known as: ADALAT CC Take 1 tablet (30 mg total) by mouth at bedtime.   prenatal vitamin w/FE, FA 27-1 MG Tabs tablet Take 1 tablet by mouth daily at 12 noon.        Discharge Exam: Filed Weights   05/27/23 1633 05/28/23 1429  Weight: (!) 171 kg (!) 166.1 kg        General:  AAO x 3,  cooperative, no distress;   HEENT:  Normocephalic, PERRL, otherwise with in Normal  limits   Neuro:  CNII-XII intact. , normal motor and sensation, reflexes intact   Lungs:   Clear to auscultation BL, Respirations unlabored,  No wheezes / crackles  Cardio:    S1/S2, RRR, No murmure, No Rubs or Gallops   Abdomen:  Soft, non-tender, bowel sounds active all four quadrants, no guarding or peritoneal signs.  Muscular  skeletal:  Limited exam -global generalized weaknesses - in bed, able to move all 4 extremities,   2+ pulses,  symmetric, No pitting edema  Skin:  Dry, warm to touch, negative for any Rashes,  Wounds: Please see nursing documentation          Condition at discharge: good  The results of significant diagnostics from this hospitalization (including imaging, microbiology, ancillary and laboratory) are listed below for reference.   Imaging Studies: MR ABDOMEN MRCP W WO CONTAST  Result Date: 05/28/2023 CLINICAL DATA:  Severe pancreatitis, recently postpartum EXAM: MRI ABDOMEN WITHOUT AND WITH CONTRAST (INCLUDING MRCP) TECHNIQUE: Multiplanar multisequence MR imaging of the abdomen was performed both before and after the administration of intravenous  contrast. Heavily T2-weighted images of the biliary and pancreatic ducts were obtained, and three-dimensional MRCP images were rendered by post processing. CONTRAST:  9mL GADAVIST GADOBUTROL 1 MMOL/ML IV SOLN COMPARISON:  CT abdomen pelvis, 05/27/2023 FINDINGS: Lower chest: No acute abnormality. Hepatobiliary: No solid liver abnormality is seen. Hepatomegaly, maximum coronal span 22.5 cm. Layering sludge and gravel in the gallbladder. No biliary ductal dilatation or evidence of choledocholithiasis (series 3, image 14). Pancreas: Diffuse inflammatory fat stranding and fluid about the pancreas. No pancreatic parenchymal hypoenhancement or acute pancreatic fluid collection. No pancreatic ductal dilatation. Spleen: Normal in size without significant abnormality. Adrenals/Urinary Tract: Adrenal glands are unremarkable. Kidneys are normal, without renal calculi, solid lesion, or hydronephrosis. Stomach/Bowel: Stomach is within normal limits. No evidence of bowel wall thickening, distention, or inflammatory changes. Vascular/Lymphatic: No significant vascular findings are present. No enlarged abdominal lymph nodes. Other: No abdominal wall hernia or abnormality. No ascites. Musculoskeletal: No acute or significant osseous findings. IMPRESSION: 1. Diffuse inflammatory fat stranding and fluid about the pancreas, consistent with acute pancreatitis. No no evidence of complicating parenchymal necrosis or acute pancreatic fluid collection. 2. Layering sludge and gravel in the gallbladder. No biliary ductal dilatation or evidence of choledocholithiasis. 3. Hepatomegaly. Electronically Signed   By: Jearld Lesch M.D.   On: 05/28/2023 12:49   MR 3D Recon At Scanner  Result Date: 05/28/2023 CLINICAL DATA:  Severe pancreatitis, recently postpartum EXAM: MRI ABDOMEN WITHOUT AND WITH CONTRAST (INCLUDING MRCP) TECHNIQUE: Multiplanar multisequence MR imaging of the abdomen was performed both before and after the administration of  intravenous contrast. Heavily T2-weighted images of the biliary and pancreatic ducts were obtained, and three-dimensional MRCP images were rendered by post processing. CONTRAST:  9mL GADAVIST GADOBUTROL 1 MMOL/ML IV SOLN COMPARISON:  CT abdomen pelvis, 05/27/2023 FINDINGS: Lower chest: No acute abnormality. Hepatobiliary: No solid liver abnormality is seen. Hepatomegaly, maximum coronal span 22.5 cm. Layering sludge and gravel in the gallbladder. No biliary ductal dilatation or evidence of choledocholithiasis (series 3, image 14). Pancreas: Diffuse inflammatory fat stranding and fluid about the pancreas. No pancreatic parenchymal hypoenhancement or acute pancreatic fluid collection. No pancreatic ductal dilatation. Spleen: Normal in size without significant abnormality. Adrenals/Urinary Tract: Adrenal glands are unremarkable. Kidneys are normal, without renal calculi, solid lesion, or hydronephrosis. Stomach/Bowel: Stomach is within normal limits. No evidence of bowel wall thickening, distention, or inflammatory changes. Vascular/Lymphatic: No significant vascular findings are present. No  enlarged abdominal lymph nodes. Other: No abdominal wall hernia or abnormality. No ascites. Musculoskeletal: No acute or significant osseous findings. IMPRESSION: 1. Diffuse inflammatory fat stranding and fluid about the pancreas, consistent with acute pancreatitis. No no evidence of complicating parenchymal necrosis or acute pancreatic fluid collection. 2. Layering sludge and gravel in the gallbladder. No biliary ductal dilatation or evidence of choledocholithiasis. 3. Hepatomegaly. Electronically Signed   By: Jearld Lesch M.D.   On: 05/28/2023 12:49   CT ABDOMEN PELVIS W CONTRAST  Result Date: 05/27/2023 CLINICAL DATA:  Right upper quadrant abdominal pain, elevated LFTs, and lipase. 18 days postpartum. EXAM: CT ABDOMEN AND PELVIS WITH CONTRAST TECHNIQUE: Multidetector CT imaging of the abdomen and pelvis was performed using  the standard protocol following bolus administration of intravenous contrast. RADIATION DOSE REDUCTION: This exam was performed according to the departmental dose-optimization program which includes automated exposure control, adjustment of the mA and/or kV according to patient size and/or use of iterative reconstruction technique. CONTRAST:  OMNIPAQUE IOHEXOL 300 MG/ML  SOLN COMPARISON:  None Available. FINDINGS: Lower chest: Lung bases are clear. Hepatobiliary: Liver is within normal limits. Suspected small noncalcified gallstones (series 2/image 40), without associated inflammatory changes. No intrahepatic or extrahepatic duct dilatation. Pancreas: Peripancreatic inflammatory changes/fluid, reflecting acute pancreatitis. No walled-off necrosis. Spleen: Within normal limits. Adrenals/Urinary Tract: Adrenal glands are within normal limits. Kidneys are within normal limits.  No hydronephrosis. Bladder is within normal limits. Stomach/Bowel: Stomach is within normal limits. No evidence of bowel obstruction. Normal appendix (series 2/image 62). No colonic wall thickening or inflammatory changes. Vascular/Lymphatic: No evidence of abdominal aortic aneurysm. No suspicious abdominopelvic lymphadenopathy. Reproductive: Uterus is within normal limits. Bilateral ovaries are within normal limits. Other: No abdominopelvic ascites. Musculoskeletal: Visualized osseous structures are within normal limits. IMPRESSION: Acute pancreatitis. No walled-off necrosis. Suspected small noncalcified gallstones, without associated inflammatory changes. No intrahepatic or extrahepatic ductal dilatation. Electronically Signed   By: Charline Bills M.D.   On: 05/27/2023 22:09   US Abdomen Limited  Result Date: 05/27/2023 CLINICAL DATA:  Right upper quadrant pain EXAM: ULTRASOUND ABDOMEN LIMITED RIGHT UPPER QUADRANT COMPARISON:  None Available. FINDINGS: Gallbladder: Multiple mobile gallstones measuring up to 8 mm. No wall  thickening or sonographic Murphy sign. Common bile duct: Diameter: Normal caliber, 2 mm Liver: No focal lesion identified. Within normal limits in parenchymal echogenicity. Portal vein is patent on color Doppler imaging with normal direction of blood flow towards the liver. Other: None. IMPRESSION: Cholelithiasis.  No sonographic evidence of acute cholecystitis. Electronically Signed   By: Charlett Nose M.D.   On: 05/27/2023 19:30   US Fetal BPP W/O Non Stress  Result Date: 05/04/2023 Table formatting from the original result was not included. Images from the original result were not included.  ..an [employer] Technical sales engineer) accredited practice Center for [facility] [address] [place] [address] Ordering Provider: Myna Hidalgo, DO FOLLOW UP SONOGRAM Kristina Scott is in the office for a follow up sonogram for EFW,BPP and cord dopplers. She is a 32 y.o. year old [id number] with Estimated Date of Delivery: 05/29/23 by LMP now at  [redacted]w[redacted]d weeks gestation. Thus far the pregnancy has been complicated by CHTN,BMI 60-69 . GESTATION: SINGLETON PRESENTATION: cephalic FETAL ACTIVITY:          Heart rate         142          The fetus is active. AMNIOTIC FLUID: The amniotic fluid volume  is  normal, 15 cm. PLACENTA LOCALIZATION:  posterior GRADE 3 CERVIX: Limited view GESTATIONAL AGE AND  BIOMETRICS: Gestational criteria: Estimated Date of Delivery: 05/29/23 by LMP now at [redacted]w[redacted]d Previous Scans:9          BIPARIETAL DIAMETER           8.25 cm         33+1 weeks HEAD CIRCUMFERENCE           31.63 cm         35+2 weeks ABDOMINAL CIRCUMFERENCE           31.34 cm         35+2 weeks FEMUR LENGTH           6.74 cm         34+4 weeks                                                       AVERAGE EGA(BY THIS SCAN):  34+5 weeks                                                 ESTIMATED FETAL WEIGHT:       2546  grams, 24 % BIOPHYSICAL PROFILE:                                                                                                       COMMENTS GROSS BODY MOVEMENT                 2  TONE                2  RESPIRATIONS                2  AMNIOTIC FLUID                2                                                          SCORE:  8/8 (Note: NST was not performed as part of this antepartum testing)  DOPPLER FLOW STUDIES: UMBILICAL ARTERY RI RATIOS:   .67,.61,.69=84% ANATOMICAL SURVEY  COMMENTS CEREBRAL VENTRICLES yes normal  CHOROID PLEXUS yes normal  CEREBELLUM yes normal  CISTERNA MAGNA  Yes  normal   CAVUM SEPTI PELLUCIDI YES NORMAL                  FACIAL PROFILE yes normal  4 CHAMBERED HEART yes normal  OUTFLOW TRACTS YES normaL  3VV YES NORMAL  3VTV YES NORMAL  SITUS YES NORMAL      DIAPHRAGM yes normal  STOMACH yes normal  RENAL REGION yes normal  BLADDER yes normal      ABDOMINAL CORD INSERTION YES NORMAL  3 VESSEL CORD yes normal              GENITALIA   female     SUSPECTED ABNORMALITIES:  no QUALITY OF SCAN: Limited view TECHNICIAN COMMENTS: [place] 36 wks,cephalic,BPP 8/8,FHR 142 bpm,posterior placenta gr 3,EFW 2546 g 24%,RI .67,.61,.69=84%,AFI 15 cm A copy of this report including all images has been saved and backed up to a second source for retrieval if needed. All measures and details of the anatomical scan, placentation, fluid volume and pelvic anatomy are contained in that report. Amber Flora Lipps 05/01/2023 10:58 AM Clinical Impression and recommendations: I have reviewed the sonogram results above, combined with the patient's current clinical course, below are my impressions and any appropriate recommendations for management based on the sonographic findings. 1.  [id number] Estimated Date of Delivery: 05/29/23 by serial sonographic evaluations 2.  Fetal sonographic surveillance findings: a). Normal fluid volume b). Normal antepartum fetal assessment with BPP 8/8 c). Normal fetal Doppler ratios with  consistent diastolic flow:  84% 3.  Normal general sonographic findings Recommend continued prenatal evaluations and care based on this sonogram and as clinically indicated from the patient's clinical course. Myna Hidalgo, DO Attending Obstetrician & Gynecologist, [facility] for [facility], [facility]    [place] UA Cord Doppler  Result Date: 05/04/2023 Table formatting from the original result was not included. Images from the original result were not included.  ..an [employer] Medicine Technical sales engineer) accredited practice Center for [facility] @ [facility] [address] [place] [address] Ordering Provider: Myna Hidalgo, DO FOLLOW UP SONOGRAM Kristina Scott is in the office for a follow up sonogram for EFW,BPP and cord dopplers. She is a 32 y.o. year old [id number] with Estimated Date of Delivery: 05/29/23 by LMP now at  [redacted]w[redacted]d weeks gestation. Thus far the pregnancy has been complicated by CHTN,BMI 60-69 . GESTATION: SINGLETON PRESENTATION: cephalic FETAL ACTIVITY:          Heart rate         142          The fetus is active. AMNIOTIC FLUID: The amniotic fluid volume is  normal, 15 cm. PLACENTA LOCALIZATION:  posterior GRADE 3 CERVIX: Limited view GESTATIONAL AGE AND  BIOMETRICS: Gestational criteria: Estimated Date of Delivery: 05/29/23 by LMP now at [redacted]w[redacted]d Previous Scans:9          BIPARIETAL DIAMETER           8.25 cm         33+1 weeks HEAD CIRCUMFERENCE           31.63 cm         35+2 weeks ABDOMINAL CIRCUMFERENCE           31.34 cm         35+2 weeks FEMUR LENGTH  6.74 cm         34+4 weeks                                                       AVERAGE EGA(BY THIS SCAN):  34+5 weeks                                                 ESTIMATED FETAL WEIGHT:       2546  grams, 24 % BIOPHYSICAL PROFILE:                                                                                                      COMMENTS GROSS BODY MOVEMENT                  2  TONE                2  RESPIRATIONS                2  AMNIOTIC FLUID                2                                                          SCORE:  8/8 (Note: NST was not performed as part of this antepartum testing)  DOPPLER FLOW STUDIES: UMBILICAL ARTERY RI RATIOS:   .67,.61,.69=84% ANATOMICAL SURVEY                                                                            COMMENTS CEREBRAL VENTRICLES yes normal  CHOROID PLEXUS yes normal  CEREBELLUM yes normal  CISTERNA MAGNA  Yes  normal   CAVUM SEPTI PELLUCIDI YES NORMAL                  FACIAL PROFILE yes normal  4 CHAMBERED HEART yes normal  OUTFLOW TRACTS YES normaL  3VV YES NORMAL  3VTV YES NORMAL  SITUS YES NORMAL      DIAPHRAGM yes normal  STOMACH yes normal  RENAL REGION yes normal  BLADDER yes normal      ABDOMINAL CORD INSERTION YES NORMAL  3 VESSEL CORD yes normal              GENITALIA   female  SUSPECTED ABNORMALITIES:  no QUALITY OF SCAN: Limited view TECHNICIAN COMMENTS: [place] 36 wks,cephalic,BPP 8/8,FHR 142 bpm,posterior placenta gr 3,EFW 2546 g 24%,RI .67,.61,.69=84%,AFI 15 cm A copy of this report including all images has been saved and backed up to a second source for retrieval if needed. All measures and details of the anatomical scan, placentation, fluid volume and pelvic anatomy are contained in that report. Amber Flora Lipps 05/01/2023 10:58 AM Clinical Impression and recommendations: I have reviewed the sonogram results above, combined with the patient's current clinical course, below are my impressions and any appropriate recommendations for management based on the sonographic findings. 1.  [id number] Estimated Date of Delivery: 05/29/23 by serial sonographic evaluations 2.  Fetal sonographic surveillance findings: a). Normal fluid volume b). Normal antepartum fetal assessment with BPP 8/8 c). Normal fetal Doppler ratios with consistent diastolic flow:  84% 3.  Normal general sonographic findings Recommend continued prenatal  evaluations and care based on this sonogram and as clinically indicated from the patient's clinical course. Myna Hidalgo, DO Attending Obstetrician & Gynecologist, [facility] for [facility], [facility]    US OB Follow Up  Result Date: 05/04/2023 Table formatting from the original result was not included. Images from the original result were not included.  ..an [employer] Medicine Technical sales engineer) accredited practice Center for [facility] @ [facility] [address] [place] [address] Ordering Provider: Myna Hidalgo, DO FOLLOW UP SONOGRAM Kristina Scott is in the office for a follow up sonogram for EFW,BPP and cord dopplers. She is a 32 y.o. year old G90P2002 with Estimated Date of Delivery: 05/29/23 by LMP now at  [redacted]w[redacted]d weeks gestation. Thus far the pregnancy has been complicated by CHTN,BMI 60-69 . GESTATION: SINGLETON PRESENTATION: cephalic FETAL ACTIVITY:          Heart rate         142          The fetus is active. AMNIOTIC FLUID: The amniotic fluid volume is  normal, 15 cm. PLACENTA LOCALIZATION:  posterior GRADE 3 CERVIX: Limited view GESTATIONAL AGE AND  BIOMETRICS: Gestational criteria: Estimated Date of Delivery: 05/29/23 by LMP now at [redacted]w[redacted]d Previous Scans:9          BIPARIETAL DIAMETER           8.25 cm         33+1 weeks HEAD CIRCUMFERENCE           31.63 cm         35+2 weeks ABDOMINAL CIRCUMFERENCE           31.34 cm         35+2 weeks FEMUR LENGTH           6.74 cm         34+4 weeks                                                       AVERAGE EGA(BY THIS SCAN):  34+5 weeks                                                 ESTIMATED FETAL WEIGHT:  2546  grams, 24 % BIOPHYSICAL PROFILE:                                                                                                      COMMENTS GROSS BODY MOVEMENT                 2  TONE                2  RESPIRATIONS                2  AMNIOTIC FLUID                2                                                           SCORE:  8/8 (Note: NST was not performed as part of this antepartum testing)  DOPPLER FLOW STUDIES: UMBILICAL ARTERY RI RATIOS:   .67,.61,.69=84% ANATOMICAL SURVEY                                                                            COMMENTS CEREBRAL VENTRICLES yes normal  CHOROID PLEXUS yes normal  CEREBELLUM yes normal  CISTERNA MAGNA  Yes  normal   CAVUM SEPTI PELLUCIDI YES NORMAL                  FACIAL PROFILE yes normal  4 CHAMBERED HEART yes normal  OUTFLOW TRACTS YES normaL  3VV YES NORMAL  3VTV YES NORMAL  SITUS YES NORMAL      DIAPHRAGM yes normal  STOMACH yes normal  RENAL REGION yes normal  BLADDER yes normal      ABDOMINAL CORD INSERTION YES NORMAL  3 VESSEL CORD yes normal              GENITALIA   female     SUSPECTED ABNORMALITIES:  no QUALITY OF SCAN: Limited view TECHNICIAN COMMENTS: [place] 36 wks,cephalic,BPP 8/8,FHR 142 bpm,posterior placenta gr 3,EFW 2546 g 24%,RI .67,.61,.69=84%,AFI 15 cm A copy of this report including all images has been saved and backed up to a second source for retrieval if needed. All measures and details of the anatomical scan, placentation, fluid volume and pelvic anatomy are contained in that report. Amber Flora Lipps 05/01/2023 10:58 AM Clinical Impression and recommendations: I have reviewed the sonogram results above, combined with the patient's current clinical course, below are my impressions and any appropriate recommendations for management based on the sonographic findings. 1.  [id number] Estimated Date of Delivery: 05/29/23 by serial sonographic evaluations 2.  Fetal sonographic surveillance findings: a). Normal  fluid volume b). Normal antepartum fetal assessment with BPP 8/8 c). Normal fetal Doppler ratios with consistent diastolic flow:  84% 3.  Normal general sonographic findings Recommend continued prenatal evaluations and care based on this sonogram and as clinically indicated from the patient's clinical course.  Myna Hidalgo, DO Attending Obstetrician & Gynecologist, [facility] for [facility], [facility]     Microbiology: Results for orders placed or performed during the hospital encounter of 05/27/23  Urine Culture     Status: Abnormal   Collection Time: 05/27/23  8:05 PM   Specimen: Urine, Clean Catch  Result Value Ref Range Status   Specimen Description   Final    URINE, CLEAN CATCH Performed at [facility], [address], [place], [place] [address]    Special Requests   Final    NONE Performed at [facility], [address] [place], [place] [address]    Culture MULTIPLE SPECIES PRESENT, SUGGEST RECOLLECTION (A)  Final   Report Status 05/29/2023 FINAL  Final    Labs: CBC: Recent Labs  Lab 05/27/23 1645 05/28/23 0311  WBC 8.7 5.7  NEUTROABS  --  3.8  HGB 12.8 12.3  HCT 40.1 36.9  MCV 93.9 92.7  PLT 332 293   Basic Metabolic Panel: Recent Labs  Lab 05/27/23 1645 05/28/23 0311 05/29/23 0411  NA 137 137 137  K 4.0 3.5 3.5  CL 102 104 103  CO2 27 23 23   GLUCOSE 93 95 70  BUN 12 11 11   CREATININE 1.11* 0.90 0.94  CALCIUM 9.9 9.5 9.4  MG  --  2.1  --    Liver Function Tests: Recent Labs  Lab 05/27/23 1645 05/28/23 0311 05/29/23 0411  AST 108* 77* 31  ALT 88* 88* 60*  ALKPHOS 150* 152* 139*  BILITOT 1.3* 0.8 0.9  PROT 8.5* 8.1 7.6  ALBUMIN 4.0 3.8 3.6   CBG: No results for input(s): "GLUCAP" in the last 168 hours.  Discharge time spent: greater than 30 minutes.  Signed: Kendell Bane, MD [facility] 05/29/2023

## 2023-05-30 ENCOUNTER — Telehealth (HOSPITAL_COMMUNITY): Payer: Self-pay

## 2023-05-30 NOTE — Telephone Encounter (Signed)
05/30/2023 1259  Name: Kristina Scott MRN: [id number] DOB: Mar 11, 1991  Reason for Call:  Transition of Care Hospital Discharge Call  Contact Status: Patient Contact Status: Complete  Language assistant needed: Interpreter Mode: Interpreter Not Needed        Follow-Up Questions: Do You Have Any Concerns About Your Health As You Heal From Delivery?: No Do You Have Any Concerns About Your Infants Health?: No  Edinburgh Postnatal Depression Scale:  In the Past 7 Days:    PHQ2-9 Depression Scale:     Discharge Follow-up: Edinburgh score requires follow up?:  (Patient states that she did EPDS recently with her pediatrician. She states that she did well on her EPDS and is feeling good emotionally. Patient declines EPDS today.) Patient was advised of the following resources:: Support Group, Breastfeeding Support Group  Post-discharge interventions: Reviewed Newborn Safe Sleep Practices  Signature  Signe Colt

## 2023-06-04 ENCOUNTER — Encounter: Payer: Self-pay | Admitting: Obstetrics & Gynecology

## 2023-06-10 ENCOUNTER — Ambulatory Visit: Admit: 2023-06-10 | Payer: Medicaid Other | Admitting: Obstetrics & Gynecology

## 2023-06-10 SURGERY — SALPINGECTOMY, ROBOT-ASSISTED
Anesthesia: General

## 2023-06-12 ENCOUNTER — Encounter: Payer: Self-pay | Admitting: Obstetrics & Gynecology

## 2023-06-12 ENCOUNTER — Ambulatory Visit: Payer: Medicaid Other | Admitting: Obstetrics & Gynecology

## 2023-06-12 DIAGNOSIS — K851 Biliary acute pancreatitis without necrosis or infection: Secondary | ICD-10-CM | POA: Diagnosis not present

## 2023-06-12 NOTE — Progress Notes (Signed)
Chief Complaint  Patient presents with   Postpartum Care      32 y.o. [id number] Patient's last menstrual period was 08/22/2022 (approximate). The current method of family planning is abstinence.  Outpatient Encounter Medications as of 06/12/2023  Medication Sig   acetaminophen (TYLENOL) 325 MG tablet Take 2 tablets (650 mg total) by mouth every 6 (six) hours as needed (for pain scale < 4).   ibuprofen (ADVIL) 600 MG tablet Take 1 tablet (600 mg total) by mouth every 6 (six) hours.   labetalol (NORMODYNE) 200 MG tablet Take 2 tablets (400 mg total) by mouth 3 (three) times daily.   lisinopril (ZESTRIL) 40 MG tablet Take 0.5 tablets (20 mg total) by mouth daily.   NIFEdipine (ADALAT CC) 30 MG 24 hr tablet Take 1 tablet (30 mg total) by mouth at bedtime.   NIFEdipine (ADALAT CC) 90 MG 24 hr tablet Take 1 tablet (90 mg total) by mouth daily.   prenatal vitamin w/FE, FA (PRENATAL 1 + 1) 27-1 MG TABS tablet Take 1 tablet by mouth daily at 12 noon.   furosemide (LASIX) 20 MG tablet Take 1 tablet (20 mg total) by mouth daily for 3 days.   No facility-administered encounter medications on file as of 06/12/2023.    Subjective Pt here postpartum on Lisinopril 20  mg daily + labetalol 400 TID + Procardia xl 90 qAM/Procardia xl 30 at bedtime  Reviewed her hospitalization for pancreatitis and felt was likely GB related, had sludge on MRCP Secure chat with Dr Lovell Sheehan confirmed  Has appt with Dr Charlotta Newton for tubal 07/07/23-->will arrange to have combined surgery if appropriate  To see Dr Lovell Sheehan 06/23/23@1045    Past Medical History:  Diagnosis Date   Contraceptive management 12/28/2013   Encounter for Nexplanon removal 12/28/2013   High blood pressure affecting pregnancy in third trimester, delivered    Hypertension    Obesity    Pregnant state, incidental 08/23/2014    Past Surgical History:  Procedure Laterality Date   WISDOM TOOTH EXTRACTION      OB History     Gravida  3    Para  2   Term  2   Preterm      AB      Living  2      SAB      IAB      Ectopic      Multiple  0   Live Births  2        Obstetric Comments  Had a baby 18 days ago         No Known Allergies  Social History   Socioeconomic History   Marital status: Married    Spouse name: Not on file   Number of children: 3   Years of education: Not on file   Highest education level: Not on file  Occupational History   Not on file  Tobacco Use   Smoking status: Former    Current packs/day: 0.00    Types: Cigarettes    Quit date: 03/07/2008    Years since quitting: 15.2   Smokeless tobacco: Never  Vaping Use   Vaping status: Never Used  Substance and Sexual Activity   Alcohol use: No    Alcohol/week: 0.0 standard drinks of alcohol   Drug use: No   Sexual activity: Not Currently    Birth control/protection: None  Other Topics Concern   Not on file  Social History Narrative   Not on  file   Social Determinants of Health   Financial Resource Strain: Low Risk  (10/16/2022)   Overall Financial Resource Strain (CARDIA)    Difficulty of Paying Living Expenses: Not hard at all  Food Insecurity: No Food Insecurity (05/28/2023)   Hunger Vital Sign    Worried About Running Out of Food in the Last Year: Never true    Ran Out of Food in the Last Year: Never true  Transportation Needs: No Transportation Needs (05/28/2023)   PRAPARE - Administrator, Civil Service (Medical): No    Lack of Transportation (Non-Medical): No  Physical Activity: Insufficiently Active (10/16/2022)   Exercise Vital Sign    Days of Exercise per Week: 3 days    Minutes of Exercise per Session: 20 min  Stress: No Stress Concern Present (10/16/2022)   [employer] of Occupational Health - Occupational Stress Questionnaire    Feeling of Stress : Not at all  Social Connections: Moderately Integrated (10/16/2022)   Social Connection and Isolation Panel [NHANES]    Frequency of  Communication with Friends and Family: More than three times a week    Frequency of Social Gatherings with Friends and Family: Twice a week    Attends Religious Services: 1 to 4 times per year    Active Member of [employer] or Organizations: No    Attends Engineer, structural: Never    Marital Status: Married    Family History  Problem Relation Age of Onset   Stroke Maternal Grandfather     Medications:       Current Outpatient Medications:    acetaminophen (TYLENOL) 325 MG tablet, Take 2 tablets (650 mg total) by mouth every 6 (six) hours as needed (for pain scale < 4)., Disp: 30 tablet, Rfl: 0   ibuprofen (ADVIL) 600 MG tablet, Take 1 tablet (600 mg total) by mouth every 6 (six) hours., Disp: 30 tablet, Rfl: 0   labetalol (NORMODYNE) 200 MG tablet, Take 2 tablets (400 mg total) by mouth 3 (three) times daily., Disp: 180 tablet, Rfl: 1   lisinopril (ZESTRIL) 40 MG tablet, Take 0.5 tablets (20 mg total) by mouth daily., Disp: 30 tablet, Rfl: 2   NIFEdipine (ADALAT CC) 30 MG 24 hr tablet, Take 1 tablet (30 mg total) by mouth at bedtime., Disp: 90 tablet, Rfl: 3   NIFEdipine (ADALAT CC) 90 MG 24 hr tablet, Take 1 tablet (90 mg total) by mouth daily., Disp: 90 tablet, Rfl: 3   prenatal vitamin w/FE, FA (PRENATAL 1 + 1) 27-1 MG TABS tablet, Take 1 tablet by mouth daily at 12 noon., Disp: 30 tablet, Rfl: 12   furosemide (LASIX) 20 MG tablet, Take 1 tablet (20 mg total) by mouth daily for 3 days., Disp: , Rfl:   Objective Blood pressure 130/80, pulse 85, height 5\' 4"  (1.626 m), weight (!) 363 lb 3.2 oz (164.7 kg), last menstrual period 08/22/2022, not currently breastfeeding.  Gen WDWN NAD  Pertinent ROS No burning with urination, frequency or urgency No nausea, vomiting or diarrhea Nor fever chills or other constitutional symptoms   Labs or studies Reviewed recent scans and labs    Impression + Management Plan: Diagnoses this Encounter::   ICD-10-CM   1. Postpartum state   Z39.2     2. Acute biliary pancreatitis without infection or necrosis  K85.10 Ambulatory referral to General Surgery        Medications prescribed during  this encounter: No orders of the defined types were placed in  this encounter.   Labs or Scans Ordered during this encounter: Orders Placed This Encounter  Procedures   Ambulatory referral to General Surgery      Follow up Return in about 5 weeks (around 07/20/2023), or if symptoms worsen or fail to improve, for Post Op.

## 2023-06-23 ENCOUNTER — Encounter: Payer: Self-pay | Admitting: General Surgery

## 2023-06-23 ENCOUNTER — Ambulatory Visit (INDEPENDENT_AMBULATORY_CARE_PROVIDER_SITE_OTHER): Payer: Medicaid Other | Admitting: General Surgery

## 2023-06-23 VITALS — BP 145/88 | HR 84 | Temp 98.4°F | Resp 14 | Ht 64.0 in | Wt 361.0 lb

## 2023-06-23 DIAGNOSIS — K851 Biliary acute pancreatitis without necrosis or infection: Secondary | ICD-10-CM

## 2023-06-23 NOTE — Progress Notes (Signed)
Kristina Scott; [id number]; 1991-07-01   HPI Patient is a 32 year old black female who was referred to my care by Dr. Duane Lope for evaluation and treatment of gallstone pancreatitis.  Patient was admitted for gallstone pancreatitis after a recent vaginal delivery in October of this year.  She was found on ultrasound the gallbladder to have cholelithiasis.  MRCP was negative for choledocholithiasis.  She states she has had intermittent episodes of upper abdominal discomfort and nausea.  She is scheduled undergo a tubal ligation by Dr. Charlotta Newton on 07/07/2023.  She denies any fever, chills, or jaundice.  Her LFTs have normalized since her discharge. Past Medical History:  Diagnosis Date   Contraceptive management 12/28/2013   Encounter for Nexplanon removal 12/28/2013   High blood pressure affecting pregnancy in third trimester, delivered    Hypertension    Obesity    Pregnant state, incidental 08/23/2014    Past Surgical History:  Procedure Laterality Date   WISDOM TOOTH EXTRACTION      Family History  Problem Relation Age of Onset   Stroke Maternal Grandfather     Current Outpatient Medications on File Prior to Visit  Medication Sig Dispense Refill   acetaminophen (TYLENOL) 325 MG tablet Take 2 tablets (650 mg total) by mouth every 6 (six) hours as needed (for pain scale < 4). 30 tablet 0   ibuprofen (ADVIL) 600 MG tablet Take 1 tablet (600 mg total) by mouth every 6 (six) hours. 30 tablet 0   labetalol (NORMODYNE) 200 MG tablet Take 2 tablets (400 mg total) by mouth 3 (three) times daily. 180 tablet 1   lisinopril (ZESTRIL) 40 MG tablet Take 0.5 tablets (20 mg total) by mouth daily. 30 tablet 2   NIFEdipine (ADALAT CC) 30 MG 24 hr tablet Take 1 tablet (30 mg total) by mouth at bedtime. 90 tablet 3   NIFEdipine (ADALAT CC) 90 MG 24 hr tablet Take 1 tablet (90 mg total) by mouth daily. 90 tablet 3   prenatal vitamin w/FE, FA (PRENATAL 1 + 1) 27-1 MG TABS tablet Take 1 tablet by mouth  daily at 12 noon. 30 tablet 12   No current facility-administered medications on file prior to visit.    No Known Allergies  Social History   Substance and Sexual Activity  Alcohol Use No   Alcohol/week: 0.0 standard drinks of alcohol    Social History   Tobacco Use  Smoking Status Former   Current packs/day: 0.00   Types: Cigarettes   Quit date: 03/07/2008   Years since quitting: 15.3  Smokeless Tobacco Never    Review of Systems  Constitutional: Negative.   HENT: Negative.    Eyes: Negative.   Respiratory: Negative.    Cardiovascular: Negative.   Gastrointestinal: Negative.   Genitourinary: Negative.   Musculoskeletal: Negative.   Skin: Negative.   Neurological: Negative.   Endo/Heme/Allergies: Negative.   Psychiatric/Behavioral: Negative.      Objective   Vitals:   06/23/23 1043  BP: (!) 145/88  Pulse: 84  Resp: 14  Temp: 98.4 F (36.9 C)  SpO2: 94%    Physical Exam Vitals reviewed.  Constitutional:      Appearance: Normal appearance. She is obese. She is not ill-appearing.  HENT:     Head: Normocephalic and atraumatic.  Cardiovascular:     Rate and Rhythm: Normal rate and regular rhythm.     Heart sounds: Normal heart sounds. No murmur heard.    No friction rub. No gallop.  Pulmonary:  Effort: Pulmonary effort is normal. No respiratory distress.     Breath sounds: Normal breath sounds. No stridor. No wheezing, rhonchi or rales.  Abdominal:     General: Bowel sounds are normal. There is no distension.     Palpations: Abdomen is soft. There is no mass.     Tenderness: There is no abdominal tenderness. There is no guarding or rebound.     Hernia: No hernia is present.  Skin:    General: Skin is warm and dry.  Neurological:     Mental Status: She is alert and oriented to person, place, and time.   Pre-V's admission notes reviewed.  Ultrasound and MRCP reports reviewed  Assessment  Gallstone pancreatitis with history of  cholelithiasis Plan  Patient is scheduled for robotic assisted laparoscopic cholecystectomy on 07/07/2023.  She will also undergo procedure by Dr. Charlotta Newton at the same time.  The risks and benefits of the procedure including bleeding, infection, hepatobiliary injury, and the possibility of an open procedure were fully explained to the patient, who gave informed consent.

## 2023-06-23 NOTE — H&P (Signed)
Kristina Scott; [id number]; 1991-07-01   HPI Patient is a 32 year old black female who was referred to my care by Dr. Duane Lope for evaluation and treatment of gallstone pancreatitis.  Patient was admitted for gallstone pancreatitis after a recent vaginal delivery in October of this year.  She was found on ultrasound the gallbladder to have cholelithiasis.  MRCP was negative for choledocholithiasis.  She states she has had intermittent episodes of upper abdominal discomfort and nausea.  She is scheduled undergo a tubal ligation by Dr. Charlotta Newton on 07/07/2023.  She denies any fever, chills, or jaundice.  Her LFTs have normalized since her discharge. Past Medical History:  Diagnosis Date   Contraceptive management 12/28/2013   Encounter for Nexplanon removal 12/28/2013   High blood pressure affecting pregnancy in third trimester, delivered    Hypertension    Obesity    Pregnant state, incidental 08/23/2014    Past Surgical History:  Procedure Laterality Date   WISDOM TOOTH EXTRACTION      Family History  Problem Relation Age of Onset   Stroke Maternal Grandfather     Current Outpatient Medications on File Prior to Visit  Medication Sig Dispense Refill   acetaminophen (TYLENOL) 325 MG tablet Take 2 tablets (650 mg total) by mouth every 6 (six) hours as needed (for pain scale < 4). 30 tablet 0   ibuprofen (ADVIL) 600 MG tablet Take 1 tablet (600 mg total) by mouth every 6 (six) hours. 30 tablet 0   labetalol (NORMODYNE) 200 MG tablet Take 2 tablets (400 mg total) by mouth 3 (three) times daily. 180 tablet 1   lisinopril (ZESTRIL) 40 MG tablet Take 0.5 tablets (20 mg total) by mouth daily. 30 tablet 2   NIFEdipine (ADALAT CC) 30 MG 24 hr tablet Take 1 tablet (30 mg total) by mouth at bedtime. 90 tablet 3   NIFEdipine (ADALAT CC) 90 MG 24 hr tablet Take 1 tablet (90 mg total) by mouth daily. 90 tablet 3   prenatal vitamin w/FE, FA (PRENATAL 1 + 1) 27-1 MG TABS tablet Take 1 tablet by mouth  daily at 12 noon. 30 tablet 12   No current facility-administered medications on file prior to visit.    No Known Allergies  Social History   Substance and Sexual Activity  Alcohol Use No   Alcohol/week: 0.0 standard drinks of alcohol    Social History   Tobacco Use  Smoking Status Former   Current packs/day: 0.00   Types: Cigarettes   Quit date: 03/07/2008   Years since quitting: 15.3  Smokeless Tobacco Never    Review of Systems  Constitutional: Negative.   HENT: Negative.    Eyes: Negative.   Respiratory: Negative.    Cardiovascular: Negative.   Gastrointestinal: Negative.   Genitourinary: Negative.   Musculoskeletal: Negative.   Skin: Negative.   Neurological: Negative.   Endo/Heme/Allergies: Negative.   Psychiatric/Behavioral: Negative.      Objective   Vitals:   06/23/23 1043  BP: (!) 145/88  Pulse: 84  Resp: 14  Temp: 98.4 F (36.9 C)  SpO2: 94%    Physical Exam Vitals reviewed.  Constitutional:      Appearance: Normal appearance. She is obese. She is not ill-appearing.  HENT:     Head: Normocephalic and atraumatic.  Cardiovascular:     Rate and Rhythm: Normal rate and regular rhythm.     Heart sounds: Normal heart sounds. No murmur heard.    No friction rub. No gallop.  Pulmonary:  Effort: Pulmonary effort is normal. No respiratory distress.     Breath sounds: Normal breath sounds. No stridor. No wheezing, rhonchi or rales.  Abdominal:     General: Bowel sounds are normal. There is no distension.     Palpations: Abdomen is soft. There is no mass.     Tenderness: There is no abdominal tenderness. There is no guarding or rebound.     Hernia: No hernia is present.  Skin:    General: Skin is warm and dry.  Neurological:     Mental Status: She is alert and oriented to person, place, and time.   Pre-V's admission notes reviewed.  Ultrasound and MRCP reports reviewed  Assessment  Gallstone pancreatitis with history of  cholelithiasis Plan  Patient is scheduled for robotic assisted laparoscopic cholecystectomy on 07/07/2023.  She will also undergo procedure by Dr. Charlotta Newton at the same time.  The risks and benefits of the procedure including bleeding, infection, hepatobiliary injury, and the possibility of an open procedure were fully explained to the patient, who gave informed consent.

## 2023-07-02 NOTE — Patient Instructions (Signed)
Kristina Scott  07/02/2023     @PREFPERIOPPHARMACY @   Your procedure is scheduled on  07/07/2023.   Report to [facility] at  0600 A.M.   Call this number if you have problems the morning of surgery:  [phone]  If you experience any cold or flu symptoms such as cough, fever, chills, shortness of breath, etc. between now and your scheduled surgery, please notify us at the above number.   Remember:  Do not eat after midnight.   You may drink clear liquids until 0330 am on 07/07/2023.    Clear liquids allowed are:                    Water, Juice (No red color; non-citric and without pulp; diabetics please choose diet or no sugar options), Carbonated beverages (diabetics please choose diet or no sugar options), Clear Tea (No creamer, milk, or cream, including half & half and powdered creamer), Black Coffee Only (No creamer, milk or cream, including half & half and powdered creamer), and Clear Sports drink (No red color; diabetics please choose diet or no sugar options)    Take these medicines the morning of surgery with A SIP OF WATER                                    labetolol, nifedipine.    Do not wear jewelry, make-up or nail polish, including gel polish,  artificial nails, or any other type of covering on natural nails (fingers and  toes).  Do not wear lotions, powders, or perfumes, or deodorant.  Do not shave 48 hours prior to surgery.  Men may shave face and neck.  Do not bring valuables to the hospital.  [facility] is not responsible for any belongings or valuables.  Contacts, dentures or bridgework may not be worn into surgery.  Leave your suitcase in the car.  After surgery it may be brought to your room.  For patients admitted to the hospital, discharge time will be determined by your treatment team.  Patients discharged the day of surgery will not be allowed to drive home and must have someone with them for 24 hours.    Special  instructions:   DO NOT smoke tobacco or vape for 24 hours before your procedure.  Please read over the following fact sheets that you were given. Pain Booklet, Coughing and Deep Breathing, Surgical Site Infection Prevention, Anesthesia Post-op Instructions, and Care and Recovery After Surgery      Minimally Invasive Cholecystectomy, Care After The following information offers guidance on how to care for yourself after your procedure. Your health care provider may also give you more specific instructions. If you have problems or questions, contact your health care provider. What can I expect after the procedure? After the procedure, it is common to have: Pain at your incision sites. You will be given medicines to control this pain. Mild nausea or vomiting. Bloating and possible shoulder pain from the gas that was used during the procedure. Follow these instructions at home: Medicines Take over-the-counter and prescription medicines only as told by your health care provider. If you were prescribed an antibiotic medicine, take it as told by your health care provider. Do not stop using the antibiotic even if you start to feel better. Ask your health care provider if the medicine prescribed to you:  Requires you to avoid driving or using machinery. Can cause constipation. You may need to take these actions to prevent or treat constipation: Drink enough fluid to keep your urine pale yellow. Take over-the-counter or prescription medicines. Eat foods that are high in fiber, such as beans, whole grains, and fresh fruits and vegetables. Limit foods that are high in fat and processed sugars, such as fried or sweet foods. Incision care  Follow instructions from your health care provider about how to take care of your incisions. Make sure you: Wash your hands with soap and water for at least 20 seconds before and after you change your bandage (dressing). If soap and water are not available, use hand  sanitizer. Change your dressing as told by your health care provider. Leave stitches (sutures), skin glue, or adhesive strips in place. These skin closures may need to be in place for 2 weeks or longer. If adhesive strip edges start to loosen and curl up, you may trim the loose edges. Do not remove adhesive strips completely unless your health care provider tells you to do that. Do not take baths, swim, or use a hot tub until your health care provider approves. Ask your health care provider if you may take showers. You may only be allowed to take sponge baths. Check your incision area every day for signs of infection. Check for: More redness, swelling, or pain. Fluid or blood. Warmth. Pus or a bad smell. Activity Rest as told by your health care provider. Do not do activities that require a lot of effort. Avoid sitting for a long time without moving. Get up to take short walks every 1-2 hours. This is important to improve blood flow and breathing. Ask for help if you feel weak or unsteady. Do not lift anything that is heavier than 10 lb (4.5 kg), or the limit that you are told, until your health care provider says that it is safe. Do not play contact sports until your health care provider approves. Do not return to work or school until your health care provider approves. Return to your normal activities as told by your health care provider. Ask your health care provider what activities are safe for you. General instructions If you were given a sedative during the procedure, it can affect you for several hours. Do not drive or operate machinery until your health care provider says that it is safe. Keep all follow-up visits. This is important. Contact a health care provider if: You develop a rash. You have more redness, swelling, or pain around your incisions. You have fluid or blood coming from your incisions. Your incisions feel warm to the touch. You have pus or a bad smell coming from your  incisions. You have a fever. One or more of your incisions breaks open. Get help right away if: You have trouble breathing. You have chest pain. You have more pain in your shoulders. You faint or feel dizzy when you stand. You have severe pain in your abdomen. You have nausea or vomiting that lasts for more than one day. You have leg pain that is new or unusual, or if it is localized to one specific spot. These symptoms may represent a serious problem that is an emergency. Do not wait to see if the symptoms will go away. Get medical help right away. Call your local emergency services (911 in the [place] Do not drive yourself to the hospital. Summary After your procedure, it is common to have pain at the  incision sites. You may also have nausea or bloating. Follow your health care provider's instructions about medicine, activity restrictions, and caring for your incision areas. Do not do activities that require a lot of effort. Contact a health care provider if you have a fever or other signs of infection, such as more redness, swelling, or pain around the incisions. Get help right away if you have chest pain, increasing pain in the shoulders, or trouble breathing. This information is not intended to replace advice given to you by your health care provider. Make sure you discuss any questions you have with your health care provider. Document Revised: 01/08/2021 Document Reviewed: 01/08/2021 Elsevier Patient Education  2024 [employer] Salpingectomy, Care After The following information offers guidance on how to care for yourself after your procedure. Your health care provider may also give you more specific instructions. If you have problems or questions, contact your health care provider. What can I expect after the procedure? After the procedure, it is common to have: Pain in your abdomen. Light vaginal bleeding (spotting) for a few days. Tiredness. Your recovery time will depend on  which method was used for your surgery. Follow these instructions at home: Medicines Take over-the-counter and prescription medicines only as told by your health care provider. Ask your health care provider if the medicine prescribed to you: Requires you to avoid driving or using machinery. Can cause constipation. You may need to take actions to prevent or treat constipation, such as: Drink enough fluid to keep your urine pale yellow. Take over-the-counter or prescription medicines. Eat foods that are high in fiber, such as beans, whole grains, and fresh fruits and vegetables. Limit foods that are high in fat and processed sugars, such as fried or sweet foods. Incision care  Follow instructions from your health care provider about how to take care of your incision or incisions. Make sure you: Wash your hands with soap and water for at least 20 seconds before and after you change your bandage (dressing). If soap and water are not available, use hand sanitizer. Change or remove your dressing as told by your health care provider. Leave stitches (sutures), skin glue, staples, or adhesive strips in place. These skin closures may need to stay in place for 2 weeks or longer. If adhesive strip edges start to loosen and curl up, you may trim the loose edges. Do not remove adhesive strips completely unless your health care provider tells you to do that. Keep your dressing clean and dry. Check your incision area every day for signs of infection. Check for: Redness, swelling, or pain that gets worse. Fluid or blood. Warmth. Pus or a bad smell. Activity Rest as told by your health care provider. Avoid sitting for a long time without moving. Get up to take short walks every 1-2 hours. This is important to improve blood flow and breathing. Ask for help if you feel weak or unsteady. Return to your normal activities as told by your health care provider. Ask your health care provider what activities are safe  for you. Do not drive until your health care provider says that it is safe. Do not lift anything that is heavier than 10 lb (4.5 kg), or the limit that you are told, until your health care provider says that it is safe. This may last for 2-6 weeks depending on your surgery. Do not douche, use tampons, or have sex until your health care provider approves. General instructions Do not use any products that contain  nicotine or tobacco. These products include cigarettes, chewing tobacco, and vaping devices, such as e-cigarettes. These can delay healing after surgery. If you need help quitting, ask your health care provider. Wear compression stockings as told by your health care provider. These stockings help to prevent blood clots and reduce swelling in your legs. Do not take baths, swim, or use a hot tub until your health care provider approves. You may take showers. Keep all follow-up visits. This is important. Contact a health care provider if: You have pain when you urinate. You have redness, swelling, or more pain around an incision or an incision feels warm to the touch. You have pus, fluid, blood, or a bad smell coming from an incision or an incision starts to open. You have a fever. You have abdominal pain that gets worse or does not get better with medicine. You have a rash. You feel light-headed, have nausea and vomiting, or both. Get help right away if: You have pain in your chest or leg. You develop shortness of breath. You faint. You have increased or heavy vaginal bleeding, such as soaking a sanitary napkin in an hour. These symptoms may represent a serious problem that is an emergency. Do not wait to see if the symptoms will go away. Get medical help right away. Call your local emergency services (911 in the [place] Do not drive yourself to the hospital. Summary After the procedure, it is common to feel tired, have pain in your abdomen, and have light vaginal bleeding for a few  days. Follow instructions from your health care provider about how to take care of your incision or incisions. Return to your normal activities as told by your health care provider. Ask your health care provider what activities are safe for you. Do not douche, use tampons, or have sex until your health care provider approves. Keep all follow-up visits. This is important. This information is not intended to replace advice given to you by your health care provider. Make sure you discuss any questions you have with your health care provider. Document Revised: 05/28/2020 Document Reviewed: 05/29/2020 Elsevier Patient Education  2024 [employer] General Anesthesia, Adult, Care After The following information offers guidance on how to care for yourself after your procedure. Your health care provider may also give you more specific instructions. If you have problems or questions, contact your health care provider. What can I expect after the procedure? After the procedure, it is common for people to: Have pain or discomfort at the IV site. Have nausea or vomiting. Have a sore throat or hoarseness. Have trouble concentrating. Feel cold or chills. Feel weak, sleepy, or tired (fatigue). Have soreness and body aches. These can affect parts of the body that were not involved in surgery. Follow these instructions at home: For the time period you were told by your health care provider:  Rest. Do not participate in activities where you could fall or become injured. Do not drive or use machinery. Do not drink alcohol. Do not take sleeping pills or medicines that cause drowsiness. Do not make important decisions or sign legal documents. Do not take care of children on your own. General instructions Drink enough fluid to keep your urine pale yellow. If you have sleep apnea, surgery and certain medicines can increase your risk for breathing problems. Follow instructions from your health care provider  about wearing your sleep device: Anytime you are sleeping, including during daytime naps. While taking prescription pain medicines, sleeping medicines, or medicines  that make you drowsy. Return to your normal activities as told by your health care provider. Ask your health care provider what activities are safe for you. Take over-the-counter and prescription medicines only as told by your health care provider. Do not use any products that contain nicotine or tobacco. These products include cigarettes, chewing tobacco, and vaping devices, such as e-cigarettes. These can delay incision healing after surgery. If you need help quitting, ask your health care provider. Contact a health care provider if: You have nausea or vomiting that does not get better with medicine. You vomit every time you eat or drink. You have pain that does not get better with medicine. You cannot urinate or have bloody urine. You develop a skin rash. You have a fever. Get help right away if: You have trouble breathing. You have chest pain. You vomit blood. These symptoms may be an emergency. Get help right away. Call 911. Do not wait to see if the symptoms will go away. Do not drive yourself to the hospital. Summary After the procedure, it is common to have a sore throat, hoarseness, nausea, vomiting, or to feel weak, sleepy, or fatigue. For the time period you were told by your health care provider, do not drive or use machinery. Get help right away if you have difficulty breathing, have chest pain, or vomit blood. These symptoms may be an emergency. This information is not intended to replace advice given to you by your health care provider. Make sure you discuss any questions you have with your health care provider. Document Revised: 10/04/2021 Document Reviewed: 10/04/2021 Elsevier Patient Education  2024 [employer] How to Use Chlorhexidine Before Surgery Chlorhexidine gluconate (CHG) is a germ-killing  (antiseptic) solution that is used to clean the skin. It can get rid of the bacteria that normally live on the skin and can keep them away for about 24 hours. To clean your skin with CHG, you may be given: A CHG solution to use in the shower or as part of a sponge bath. A prepackaged cloth that contains CHG. Cleaning your skin with CHG may help lower the risk for infection: While you are staying in the intensive care unit of the hospital. If you have a vascular access, such as a central line, to provide short-term or long-term access to your veins. If you have a catheter to drain urine from your bladder. If you are on a ventilator. A ventilator is a machine that helps you breathe by moving air in and out of your lungs. After surgery. What are the risks? Risks of using CHG include: A skin reaction. Hearing loss, if CHG gets in your ears and you have a perforated eardrum. Eye injury, if CHG gets in your eyes and is not rinsed out. The CHG product catching fire. Make sure that you avoid smoking and flames after applying CHG to your skin. Do not use CHG: If you have a chlorhexidine allergy or have previously reacted to chlorhexidine. On babies younger than 42 months of age. How to use CHG solution Use CHG only as told by your health care provider, and follow the instructions on the label. Use the full amount of CHG as directed. Usually, this is one bottle. During a shower Follow these steps when using CHG solution during a shower (unless your health care provider gives you different instructions): Start the shower. Use your normal soap and shampoo to wash your face and hair. Turn off the shower or move out of the shower  stream. Pour the CHG onto a clean washcloth. Do not use any type of brush or rough-edged sponge. Starting at your neck, lather your body down to your toes. Make sure you follow these instructions: If you will be having surgery, pay special attention to the part of your body  where you will be having surgery. Scrub this area for at least 1 minute. Do not use CHG on your head or face. If the solution gets into your ears or eyes, rinse them well with water. Avoid your genital area. Avoid any areas of skin that have broken skin, cuts, or scrapes. Scrub your back and under your arms. Make sure to wash skin folds. Let the lather sit on your skin for 1-2 minutes or as long as told by your health care provider. Thoroughly rinse your entire body in the shower. Make sure that all body creases and crevices are rinsed well. Dry off with a clean towel. Do not put any substances on your body afterward--such as powder, lotion, or perfume--unless you are told to do so by your health care provider. Only use lotions that are recommended by the manufacturer. Put on clean clothes or pajamas. If it is the night before your surgery, sleep in clean sheets.  During a sponge bath Follow these steps when using CHG solution during a sponge bath (unless your health care provider gives you different instructions): Use your normal soap and shampoo to wash your face and hair. Pour the CHG onto a clean washcloth. Starting at your neck, lather your body down to your toes. Make sure you follow these instructions: If you will be having surgery, pay special attention to the part of your body where you will be having surgery. Scrub this area for at least 1 minute. Do not use CHG on your head or face. If the solution gets into your ears or eyes, rinse them well with water. Avoid your genital area. Avoid any areas of skin that have broken skin, cuts, or scrapes. Scrub your back and under your arms. Make sure to wash skin folds. Let the lather sit on your skin for 1-2 minutes or as long as told by your health care provider. Using a different clean, wet washcloth, thoroughly rinse your entire body. Make sure that all body creases and crevices are rinsed well. Dry off with a clean towel. Do not put any  substances on your body afterward--such as powder, lotion, or perfume--unless you are told to do so by your health care provider. Only use lotions that are recommended by the manufacturer. Put on clean clothes or pajamas. If it is the night before your surgery, sleep in clean sheets. How to use CHG prepackaged cloths Only use CHG cloths as told by your health care provider, and follow the instructions on the label. Use the CHG cloth on clean, dry skin. Do not use the CHG cloth on your head or face unless your health care provider tells you to. When washing with the CHG cloth: Avoid your genital area. Avoid any areas of skin that have broken skin, cuts, or scrapes. Before surgery Follow these steps when using a CHG cloth to clean before surgery (unless your health care provider gives you different instructions): Using the CHG cloth, vigorously scrub the part of your body where you will be having surgery. Scrub using a back-and-forth motion for 3 minutes. The area on your body should be completely wet with CHG when you are done scrubbing. Do not rinse. Discard the cloth  and let the area air-dry. Do not put any substances on the area afterward, such as powder, lotion, or perfume. Put on clean clothes or pajamas. If it is the night before your surgery, sleep in clean sheets.  For general bathing Follow these steps when using CHG cloths for general bathing (unless your health care provider gives you different instructions). Use a separate CHG cloth for each area of your body. Make sure you wash between any folds of skin and between your fingers and toes. Wash your body in the following order, switching to a new cloth after each step: The front of your neck, shoulders, and chest. Both of your arms, under your arms, and your hands. Your stomach and groin area, avoiding the genitals. Your right leg and foot. Your left leg and foot. The back of your neck, your back, and your buttocks. Do not rinse.  Discard the cloth and let the area air-dry. Do not put any substances on your body afterward--such as powder, lotion, or perfume--unless you are told to do so by your health care provider. Only use lotions that are recommended by the manufacturer. Put on clean clothes or pajamas. Contact a health care provider if: Your skin gets irritated after scrubbing. You have questions about using your solution or cloth. You swallow any chlorhexidine. Call your local poison control center ([phone] in the [place] Get help right away if: Your eyes itch badly, or they become very red or swollen. Your skin itches badly and is red or swollen. Your hearing changes. You have trouble seeing. You have swelling or tingling in your mouth or throat. You have trouble breathing. These symptoms may represent a serious problem that is an emergency. Do not wait to see if the symptoms will go away. Get medical help right away. Call your local emergency services (911 in the [place] Do not drive yourself to the hospital. Summary Chlorhexidine gluconate (CHG) is a germ-killing (antiseptic) solution that is used to clean the skin. Cleaning your skin with CHG may help to lower your risk for infection. You may be given CHG to use for bathing. It may be in a bottle or in a prepackaged cloth to use on your skin. Carefully follow your health care provider's instructions and the instructions on the product label. Do not use CHG if you have a chlorhexidine allergy. Contact your health care provider if your skin gets irritated after scrubbing. This information is not intended to replace advice given to you by your health care provider. Make sure you discuss any questions you have with your health care provider. Document Revised: 11/04/2021 Document Reviewed: 09/17/2020 Elsevier Patient Education  2023 [employer].

## 2023-07-03 ENCOUNTER — Encounter (HOSPITAL_COMMUNITY)
Admission: RE | Admit: 2023-07-03 | Discharge: 2023-07-03 | Disposition: A | Discharge status: Home / Self Care | Payer: Medicaid Other | Source: Ambulatory Visit | Attending: Obstetrics & Gynecology

## 2023-07-03 ENCOUNTER — Encounter (HOSPITAL_COMMUNITY): Payer: Self-pay

## 2023-07-03 VITALS — BP 145/88 | HR 84 | Temp 98.4°F | Resp 18 | Ht 64.0 in | Wt 361.0 lb

## 2023-07-03 DIAGNOSIS — Z01818 Encounter for other preprocedural examination: Secondary | ICD-10-CM

## 2023-07-03 DIAGNOSIS — Z302 Encounter for sterilization: Secondary | ICD-10-CM

## 2023-07-03 DIAGNOSIS — Z01812 Encounter for preprocedural laboratory examination: Secondary | ICD-10-CM | POA: Insufficient documentation

## 2023-07-03 HISTORY — DX: Anemia, unspecified: D64.9

## 2023-07-03 LAB — CBC
HCT: 33.4 % — ABNORMAL LOW (ref 36.0–46.0)
Hemoglobin: 11.1 g/dL — ABNORMAL LOW (ref 12.0–15.0)
MCH: 30.7 pg (ref 26.0–34.0)
MCHC: 33.2 g/dL (ref 30.0–36.0)
MCV: 92.5 fL (ref 80.0–100.0)
Platelets: 306 10*3/uL (ref 150–400)
RBC: 3.61 MIL/uL — ABNORMAL LOW (ref 3.87–5.11)
RDW: 13.3 % (ref 11.5–15.5)
WBC: 6.6 10*3/uL (ref 4.0–10.5)
nRBC: 0 % (ref 0.0–0.2)

## 2023-07-03 LAB — POCT PREGNANCY, URINE: Preg Test, Ur: NEGATIVE

## 2023-07-05 NOTE — H&P (Signed)
[facility]ulty [facility] Obstetrics and Gynecology Attending History and Physical  Kristina Scott is a 32 y.o. [id number] who presents for scheduled laparoscopic bilateral salpingectomy for permanent sterilization and removal of Nexplanon.  Due to gallstone pancreatitis, she is also having cholecystectomy today- see H&P per Dr. Lovell Sheehan.  Today she notes no acute complaints or concerns since her last visit.  Past Medical History:  Diagnosis Date   Anemia    Contraceptive management 12/28/2013   Encounter for Nexplanon removal 12/28/2013   High blood pressure affecting pregnancy in third trimester, delivered    Hypertension    Obesity    Pregnant state, incidental 08/23/2014   Past Surgical History:  Procedure Laterality Date   WISDOM TOOTH EXTRACTION     OB History  Gravida Para Term Preterm AB Living  3 2 2   2   SAB IAB Ectopic Multiple Live Births     0 2    # Outcome Date GA Lbr Len/2nd Weight Sex Type Anes PTL Lv  3 Term 03/07/15 [redacted]w[redacted]d 03:30 / 00:10 3215 g M Vag-Spont EPI N LIV     Birth Comments: wnl     Complications: Mild pre-eclampsia  2 Term 11/29/06 [redacted]w[redacted]d  3345 g F Vag-Spont  N LIV  1 Gravida             Obstetric Comments  Had a baby 18 days ago  Patient denies any other pertinent gynecologic issues.  No current facility-administered medications on file prior to encounter.   Current Outpatient Medications on File Prior to Encounter  Medication Sig Dispense Refill   labetalol (NORMODYNE) 200 MG tablet Take 2 tablets (400 mg total) by mouth 3 (three) times daily. 180 tablet 1   lisinopril (ZESTRIL) 40 MG tablet Take 0.5 tablets (20 mg total) by mouth daily. 30 tablet 2   NIFEdipine (ADALAT CC) 30 MG 24 hr tablet Take 1 tablet (30 mg total) by mouth at bedtime. 90 tablet 3   NIFEdipine (ADALAT CC) 90 MG 24 hr tablet Take 1 tablet (90 mg total) by mouth daily. 90 tablet 3   prenatal vitamin w/FE, FA (PRENATAL 1 + 1) 27-1 MG TABS tablet Take 1 tablet by mouth daily at 12  noon. 30 tablet 12   acetaminophen (TYLENOL) 325 MG tablet Take 2 tablets (650 mg total) by mouth every 6 (six) hours as needed (for pain scale < 4). (Patient not taking: Reported on 07/01/2023) 30 tablet 0   ibuprofen (ADVIL) 600 MG tablet Take 1 tablet (600 mg total) by mouth every 6 (six) hours. (Patient not taking: Reported on 07/01/2023) 30 tablet 0   No Known Allergies  Social History:   reports that she quit smoking about 15 years ago. Her smoking use included cigarettes. She has never used smokeless tobacco. She reports that she does not drink alcohol and does not use drugs. Family History  Problem Relation Age of Onset   Stroke Maternal Grandfather     Review of Systems: Pertinent items noted in HPI and remainder of comprehensive ROS otherwise negative.  PHYSICAL EXAM: Blood pressure (!) 124/57, temperature 98.3 F (36.8 C), temperature source Oral, resp. rate 20, height 5\' 4"  (1.626 m), weight (!) 163.7 kg, last menstrual period 08/22/2022, SpO2 100%. CONSTITUTIONAL: Well-developed, well-nourished female in no acute distress.  SKIN: Skin is warm and dry. No rash noted. Not diaphoretic. No erythema. No pallor. NEUROLOGIC: Alert and oriented to person, place, and time. Normal reflexes, muscle tone coordination. No cranial nerve deficit noted. PSYCHIATRIC: Normal  mood and affect. Normal behavior. Normal judgment and thought content. CARDIOVASCULAR: Normal heart rate noted, regular rhythm RESPIRATORY: Effort and breath sounds normal, no problems with respiration noted ABDOMEN: obese, Soft, nontender, nondistended. PELVIC: deferred MUSCULOSKELETAL: no calf tenderness bilaterally EXT: no edema bilaterally, normal pulses  Labs: Results for orders placed or performed during the hospital encounter of 07/03/23 (from the past 2 weeks)  CBC   Collection Time: 07/03/23  1:53 PM  Result Value Ref Range   WBC 6.6 4.0 - 10.5 K/uL   RBC 3.61 (L) 3.87 - 5.11 MIL/uL   Hemoglobin 11.1 (L)  12.0 - 15.0 g/dL   HCT 14.7 (L) 82.9 - 56.2 %   MCV 92.5 80.0 - 100.0 fL   MCH 30.7 26.0 - 34.0 pg   MCHC 33.2 30.0 - 36.0 g/dL   RDW 13.0 86.5 - 78.4 %   Platelets 306 150 - 400 K/uL   nRBC 0.0 0.0 - 0.2 %  Pregnancy, urine POC   Collection Time: 07/03/23  2:10 PM  Result Value Ref Range   Preg Test, Ur NEGATIVE NEGATIVE   Assessment: -desires sterilization -Nexplanon in place  Plan: Laparoscopic bilateral salpingectomy, Nexplanon removal -NPO -LR @ 125cc/hr -SCDs to OR -Risk/benefits and alternatives reviewed with the patient including but not limited to risk of bleeding, infection and injury to surrounding organs requiring further surgical intervention. Discussed potential for open procedure. Questions and concerns were addressed and pt desires to proceed  Myna Hidalgo, DO Attending Obstetrician & Gynecologist, [facility] for [facility], [facility]

## 2023-07-07 ENCOUNTER — Encounter (HOSPITAL_COMMUNITY): Payer: Self-pay | Admitting: Obstetrics & Gynecology

## 2023-07-07 ENCOUNTER — Encounter (HOSPITAL_COMMUNITY)
Admission: RE | Disposition: A | Discharge status: Home / Self Care | Payer: Self-pay | Source: Home / Self Care | Attending: Obstetrics & Gynecology

## 2023-07-07 ENCOUNTER — Ambulatory Visit (HOSPITAL_COMMUNITY)
Admission: RE | Admit: 2023-07-07 | Discharge: 2023-07-07 | Disposition: A | Discharge status: Home / Self Care | Payer: Medicaid Other | Attending: Obstetrics & Gynecology | Admitting: Obstetrics & Gynecology

## 2023-07-07 ENCOUNTER — Ambulatory Visit (HOSPITAL_COMMUNITY): Payer: Self-pay | Admitting: Anesthesiology

## 2023-07-07 ENCOUNTER — Ambulatory Visit (HOSPITAL_BASED_OUTPATIENT_CLINIC_OR_DEPARTMENT_OTHER): Payer: Self-pay | Admitting: Anesthesiology

## 2023-07-07 DIAGNOSIS — Z8759 Personal history of other complications of pregnancy, childbirth and the puerperium: Secondary | ICD-10-CM | POA: Insufficient documentation

## 2023-07-07 DIAGNOSIS — Z87891 Personal history of nicotine dependence: Secondary | ICD-10-CM | POA: Diagnosis not present

## 2023-07-07 DIAGNOSIS — Z3046 Encounter for surveillance of implantable subdermal contraceptive: Secondary | ICD-10-CM | POA: Diagnosis not present

## 2023-07-07 DIAGNOSIS — Z302 Encounter for sterilization: Secondary | ICD-10-CM

## 2023-07-07 DIAGNOSIS — Z01818 Encounter for other preprocedural examination: Secondary | ICD-10-CM

## 2023-07-07 DIAGNOSIS — K851 Biliary acute pancreatitis without necrosis or infection: Secondary | ICD-10-CM

## 2023-07-07 DIAGNOSIS — K8010 Calculus of gallbladder with chronic cholecystitis without obstruction: Secondary | ICD-10-CM | POA: Diagnosis not present

## 2023-07-07 HISTORY — PX: REMOVAL OF NON VAGINAL CONTRACEPTIVE DEVICE: SHX6219

## 2023-07-07 HISTORY — PX: LAPAROSCOPIC BILATERAL SALPINGECTOMY: SHX5889

## 2023-07-07 SURGERY — SALPINGECTOMY, BILATERAL, LAPAROSCOPIC
Anesthesia: General | Site: Arm Upper

## 2023-07-07 MED ORDER — ORAL CARE MOUTH RINSE: 15.0000 mL | Freq: Once | OROMUCOSAL | Status: DC

## 2023-07-07 MED ORDER — MIDAZOLAM HCL 2 MG/2ML IJ SOLN
INTRAMUSCULAR | Status: AC
  Filled 2023-07-07: qty 2

## 2023-07-07 MED ORDER — ONDANSETRON HCL 4 MG/2ML IJ SOLN
INTRAMUSCULAR | Status: AC
  Filled 2023-07-07: qty 2

## 2023-07-07 MED ORDER — CHLORHEXIDINE GLUCONATE CLOTH 2 % EX PADS: 6.0000 | MEDICATED_PAD | Freq: Once | CUTANEOUS | Status: DC

## 2023-07-07 MED ORDER — FENTANYL CITRATE (PF) 250 MCG/5ML IJ SOLN
INTRAMUSCULAR | Status: AC
  Filled 2023-07-07: qty 5

## 2023-07-07 MED ORDER — FENTANYL CITRATE (PF) 100 MCG/2ML IJ SOLN
INTRAMUSCULAR | Status: DC | PRN
  Administered 2023-07-07 (×5): 50 ug via INTRAVENOUS

## 2023-07-07 MED ORDER — POVIDONE-IODINE 10 % EX SWAB: 2.0000 | Freq: Once | CUTANEOUS | Status: DC

## 2023-07-07 MED ORDER — SODIUM CHLORIDE 0.9 % IV SOLN
3.0000 g | INTRAVENOUS | Status: AC
  Administered 2023-07-07: 3 g via INTRAVENOUS
  Filled 2023-07-07: qty 3

## 2023-07-07 MED ORDER — OXYCODONE HCL 5 MG PO TABS
5.0000 mg | ORAL_TABLET | Freq: Once | ORAL | Status: AC | PRN
  Administered 2023-07-07: 5 mg via ORAL
  Filled 2023-07-07: qty 1

## 2023-07-07 MED ORDER — ONDANSETRON HCL 4 MG/2ML IJ SOLN
INTRAMUSCULAR | Status: DC | PRN
  Administered 2023-07-07: 4 mg via INTRAVENOUS

## 2023-07-07 MED ORDER — OXYCODONE HCL 5 MG PO TABS: 5.0000 mg | ORAL_TABLET | ORAL | 0 refills | Status: DC | PRN

## 2023-07-07 MED ORDER — BUPIVACAINE HCL (PF) 0.25 % IJ SOLN
INTRAMUSCULAR | Status: AC
Start: 2023-07-07 — End: ?
  Filled 2023-07-07: qty 60

## 2023-07-07 MED ORDER — DEXAMETHASONE SODIUM PHOSPHATE 10 MG/ML IJ SOLN
INTRAMUSCULAR | Status: DC | PRN
  Administered 2023-07-07: 5 mg via INTRAVENOUS

## 2023-07-07 MED ORDER — LIDOCAINE HCL (CARDIAC) PF 100 MG/5ML IV SOSY
PREFILLED_SYRINGE | INTRAVENOUS | Status: DC | PRN
  Administered 2023-07-07: 100 mg via INTRAVENOUS

## 2023-07-07 MED ORDER — PROPOFOL 10 MG/ML IV BOLUS
INTRAVENOUS | Status: AC
  Filled 2023-07-07: qty 20

## 2023-07-07 MED ORDER — ROCURONIUM BROMIDE 100 MG/10ML IV SOLN
INTRAVENOUS | Status: DC | PRN
  Administered 2023-07-07: 20 mg via INTRAVENOUS
  Administered 2023-07-07: 50 mg via INTRAVENOUS

## 2023-07-07 MED ORDER — SUCCINYLCHOLINE CHLORIDE 200 MG/10ML IV SOSY
PREFILLED_SYRINGE | INTRAVENOUS | Status: DC | PRN
  Administered 2023-07-07: 140 mg via INTRAVENOUS

## 2023-07-07 MED ORDER — DEXAMETHASONE SODIUM PHOSPHATE 10 MG/ML IJ SOLN
INTRAMUSCULAR | Status: AC
  Filled 2023-07-07: qty 1

## 2023-07-07 MED ORDER — LACTATED RINGERS IV SOLN: INTRAVENOUS | Status: DC

## 2023-07-07 MED ORDER — ONDANSETRON HCL 4 MG/2ML IJ SOLN: 4.0000 mg | Freq: Once | INTRAMUSCULAR | Status: DC | PRN

## 2023-07-07 MED ORDER — PROPOFOL 10 MG/ML IV BOLUS
INTRAVENOUS | Status: DC | PRN
  Administered 2023-07-07: 200 mg via INTRAVENOUS

## 2023-07-07 MED ORDER — BUPIVACAINE HCL (PF) 0.25 % IJ SOLN
INTRAMUSCULAR | Status: DC | PRN
  Administered 2023-07-07: 40 mL

## 2023-07-07 MED ORDER — CHLORHEXIDINE GLUCONATE 0.12 % MT SOLN
OROMUCOSAL | Status: AC
  Filled 2023-07-07: qty 15

## 2023-07-07 MED ORDER — STERILE WATER FOR IRRIGATION IR SOLN
Status: DC | PRN
  Administered 2023-07-07: 500 mL

## 2023-07-07 MED ORDER — KETOROLAC TROMETHAMINE 30 MG/ML IJ SOLN
30.0000 mg | INTRAMUSCULAR | Status: AC
  Administered 2023-07-07: 30 mg via INTRAVENOUS
  Filled 2023-07-07: qty 1

## 2023-07-07 MED ORDER — SUCCINYLCHOLINE CHLORIDE 200 MG/10ML IV SOSY
PREFILLED_SYRINGE | INTRAVENOUS | Status: AC
  Filled 2023-07-07: qty 10

## 2023-07-07 MED ORDER — OXYCODONE HCL 5 MG/5ML PO SOLN: 5.0000 mg | Freq: Once | ORAL | Status: AC | PRN

## 2023-07-07 MED ORDER — ROCURONIUM BROMIDE 10 MG/ML (PF) SYRINGE
PREFILLED_SYRINGE | INTRAVENOUS | Status: AC
  Filled 2023-07-07: qty 10

## 2023-07-07 MED ORDER — SUGAMMADEX SODIUM 200 MG/2ML IV SOLN
INTRAVENOUS | Status: DC | PRN
  Administered 2023-07-07: 200 mg via INTRAVENOUS

## 2023-07-07 MED ORDER — MIDAZOLAM HCL 2 MG/2ML IJ SOLN
INTRAMUSCULAR | Status: DC | PRN
  Administered 2023-07-07: 2 mg via INTRAVENOUS

## 2023-07-07 MED ORDER — FENTANYL CITRATE PF 50 MCG/ML IJ SOSY
25.0000 ug | PREFILLED_SYRINGE | INTRAMUSCULAR | Status: DC | PRN
Start: 2023-07-07 — End: 2023-07-07

## 2023-07-07 MED ORDER — LACTATED RINGERS IV SOLN
INTRAVENOUS | Status: DC
  Administered 2023-07-07: 1000 mL via INTRAVENOUS

## 2023-07-07 MED ORDER — CHLORHEXIDINE GLUCONATE 0.12 % MT SOLN
15.0000 mL | Freq: Once | OROMUCOSAL | Status: DC
Start: 2023-07-07 — End: 2023-07-07

## 2023-07-07 SURGICAL SUPPLY — 51 items
BLADE SURG SZ11 CARB STEEL (BLADE) ×3 IMPLANT
CAUTERY HOOK MNPLR 1.6 DVNC XI (INSTRUMENTS) ×3 IMPLANT
CHLORAPREP W/TINT 26 (MISCELLANEOUS) ×3 IMPLANT
CLIP LIGATING HEM O LOK PURPLE (MISCELLANEOUS) ×3 IMPLANT
CLOTH BEACON ORANGE TIMEOUT ST (SAFETY) ×3 IMPLANT
COVER LIGHT HANDLE STERIS (MISCELLANEOUS) ×6 IMPLANT
COVER PROBE CYLINDRICAL 5X96 (MISCELLANEOUS) IMPLANT
DERMABOND ADVANCED .7 DNX12 (GAUZE/BANDAGES/DRESSINGS) ×3 IMPLANT
DRAPE ARM DVNC X/XI (DISPOSABLE) ×12 IMPLANT
DRAPE COLUMN DVNC XI (DISPOSABLE) ×3 IMPLANT
ELECT REM PT RETURN 9FT ADLT (ELECTROSURGICAL) ×3
ELECTRODE REM PT RTRN 9FT ADLT (ELECTROSURGICAL) ×3 IMPLANT
FORCEPS BPLR R/ABLATION 8 DVNC (INSTRUMENTS) ×3 IMPLANT
FORCEPS PROGRASP DVNC XI (FORCEP) ×3 IMPLANT
GAUZE 4X4 16PLY ~~LOC~~+RFID DBL (SPONGE) ×6 IMPLANT
GAUZE SPONGE 4X4 12PLY STRL (GAUZE/BANDAGES/DRESSINGS) IMPLANT
GLOVE BIO SURGEON STRL SZ 6.5 (GLOVE) ×3 IMPLANT
GLOVE BIOGEL PI IND STRL 7.0 (GLOVE) ×15 IMPLANT
GLOVE SURG SS PI 7.5 STRL IVOR (GLOVE) ×6 IMPLANT
GOWN STRL REUS W/TWL LRG LVL3 (GOWN DISPOSABLE) ×9 IMPLANT
KIT TURNOVER KIT A (KITS) ×3 IMPLANT
LIGASURE LAP MARYLAND 5MM 37CM (ELECTROSURGICAL) IMPLANT
NDL HYPO 21X1.5 SAFETY (NEEDLE) ×3 IMPLANT
NDL HYPO 25X1 1.5 SAFETY (NEEDLE) ×3 IMPLANT
NDL INSUFFLATION 14GA 120MM (NEEDLE) ×3 IMPLANT
NEEDLE HYPO 21X1.5 SAFETY (NEEDLE) ×3 IMPLANT
NEEDLE HYPO 25X1 1.5 SAFETY (NEEDLE) ×3 IMPLANT
NEEDLE INSUFFLATION 14GA 120MM (NEEDLE) ×3 IMPLANT
OBTURATOR OPTICAL STND 8 DVNC (TROCAR) ×3
OBTURATOR OPTICALSTD 8 DVNC (TROCAR) ×3 IMPLANT
PACK LAP CHOLE LZT030E (CUSTOM PROCEDURE TRAY) ×3 IMPLANT
PAD ARMBOARD 7.5X6 YLW CONV (MISCELLANEOUS) ×3 IMPLANT
PENCIL HANDSWITCHING (ELECTRODE) ×3 IMPLANT
POSITIONER HEAD 8X9X4 ADT (SOFTGOODS) ×3 IMPLANT
SEAL UNIV 5-12 XI (MISCELLANEOUS) ×12 IMPLANT
SET BASIN LINEN APH (SET/KITS/TRAYS/PACK) ×3 IMPLANT
SET TUBE SMOKE EVAC HIGH FLOW (TUBING) ×3 IMPLANT
SLEEVE Z-THREAD 5X100MM (TROCAR) ×3 IMPLANT
SOL ANTI FOG 6CC (MISCELLANEOUS) ×3 IMPLANT
STRIP CLOSURE SKIN 1/4X3 (GAUZE/BANDAGES/DRESSINGS) IMPLANT
SUT MNCRL AB 4-0 PS2 18 (SUTURE) ×6 IMPLANT
SYR 10ML LL (SYRINGE) ×3 IMPLANT
SYR 30ML LL (SYRINGE) ×3 IMPLANT
SYR CONTROL 10ML LL (SYRINGE) ×3 IMPLANT
SYS RETRIEVAL 5MM INZII UNIV (BASKET) ×3
SYSTEM RETRIEVL 5MM INZII UNIV (BASKET) ×3 IMPLANT
TAPE CLOTH SURG 4X10 WHT LF (GAUZE/BANDAGES/DRESSINGS) IMPLANT
TRAY FOLEY W/BAG SLVR 16FR ST (SET/KITS/TRAYS/PACK) ×3 IMPLANT
TROCAR KII 8X100ML NONTHREADED (TROCAR) ×3 IMPLANT
TROCAR Z-THREAD FIOS 5X100MM (TROCAR) ×3 IMPLANT
WATER STERILE IRR 500ML POUR (IV SOLUTION) ×3 IMPLANT

## 2023-07-07 NOTE — Transfer of Care (Signed)
Immediate Anesthesia Transfer of Care Note  Patient: Kristina Scott  Procedure(s) Performed: LAPAROSCOPIC BILATERAL SALPINGECTOMY (Bilateral: Abdomen) REMOVAL OF NON VAGINAL CONTRACEPTIVE DEVICE -LEFT ARM NEXPLANON (Arm Upper) XI ROBOTIC ASSISTED LAPAROSCOPIC CHOLECYSTECTOMY (Abdomen)  Patient Location: PACU  Anesthesia Type:General  Level of Consciousness: awake and patient cooperative  Airway & Oxygen Therapy: Patient Spontanous Breathing and Patient connected to nasal cannula oxygen  Post-op Assessment: Report given to RN and Post -op Vital signs reviewed and stable  Post vital signs: Reviewed and stable  Last Vitals:  Vitals Value Taken Time  BP 131/85 07/07/23 0932  Temp 36.8 C 07/07/23 0932  Pulse 73 07/07/23 0933  Resp 30 07/07/23 0933  SpO2 100 % 07/07/23 0933  Vitals shown include unfiled device data.  Last Pain:  Vitals:   07/07/23 0643  TempSrc: Oral  PainSc: 0-No pain      Patients Stated Pain Goal: 8 (07/07/23 9147)  Complications: No notable events documented.

## 2023-07-07 NOTE — Op Note (Signed)
PREOPERATIVE DIAGNOSIS:  Desires sterilization POSTOPERATIVE DIAGNOSIS: same PROCEDURE PERFORMED: Laparoscopic bilateral salpingectomy, Nexplanon removal SURGEON: Dr. Myna Hidalgo ANESTHESIA: General endotracheal.  ESTIMATED BLOOD LOSS: 10cc URINE OUTPUT: 100cc IV FLUIDS: 750cc SPECIMEN(S): bilateral fallopian tubes COMPLICATIONS: None.  CONDITION: Stable.   FINDINGS: Uterus normal size and shape.  Slightly enlarged left ovary, normal left fallopian tubes.  Normal right tube and ovary.     Informed consent was obtained from the patient prior to taking her to the operating room where anesthesia was found to be adequate. See Operative note per Dr. Lovell Sheehan, following completion of cholecystectomy the robot was undocked.  Attention was then turned to the patient's pelvis.   The left fallopian tube was placed on tension and using the Ligasure, the tube was excised and removed through the port under direct visualization.  A similar procedure was performed on the right with removal of the fallopian tube. Excellent hemostasis was noted.  Under direct visualization TAP block was completed under direct visualization using 10cc of 0.25% marcaine in each of four locations.  The instruments were then removed from the patients abdomen with air allowed to fully escape. All port sites were closed with monocryl and dermabond.    Attention was then turned to the patient's left arm.  With some difficulty the device was palpate.  The area was prepped with betadine and a small incision using the scalpel was made.  Using hemostats the device was then grasped and removed in its entirety.  Excellent hemostasis was obtained with the bovie.  Dermabond was placed for closure.  The patient tolerated the procedure well with all sponge, lap, and needle counts correct. The patient was taken to recovery in stable condition.  Myna Hidalgo, DO Attending Obstetrician & Gynecologist, [facility] for [employer], [facility]

## 2023-07-07 NOTE — Anesthesia Procedure Notes (Signed)
Procedure Name: Intubation Date/Time: 07/07/2023 7:48 AM  Performed by: Franco Nones, CRNAPre-anesthesia Checklist: Patient identified, Patient being monitored, Timeout performed, Emergency Drugs available and Suction available Patient Re-evaluated:Patient Re-evaluated prior to induction Oxygen Delivery Method: Circle system utilized Preoxygenation: Pre-oxygenation with 100% oxygen Induction Type: IV induction Laryngoscope Size: Mac and 3 Grade View: Grade I Tube type: Oral Tube size: 7.0 mm Number of attempts: 1 Airway Equipment and Method: Stylet and Oral airway Placement Confirmation: ETT inserted through vocal cords under direct vision, positive ETCO2 and breath sounds checked- equal and bilateral Secured at: 22 cm Tube secured with: Tape Dental Injury: Teeth and Oropharynx as per pre-operative assessment

## 2023-07-07 NOTE — Op Note (Signed)
Patient:  Kristina Scott  DOB:  25-Apr-1991  MRN:  [id number]   Preop Diagnosis: Gallstone pancreatitis  Postop Diagnosis: Same  Procedure: Robotic assisted laparoscopic cholecystectomy  Surgeon: Franky Macho, MD  Anes: General Endotracheal  Indications: Patient is a 32 year old black female who with a history of gallstone pancreatitis who now presents for a robotic assisted laparoscopic cholecystectomy.  In addition, she will undergo a bilateral salpingectomy by Dr. Charlotta Newton of gynecology.  The risks and benefits of the procedure including bleeding, infection, hepatobiliary injury, and the possibility of an open procedure were fully explained to the patient, who gave informed consent.  Procedure note: The patient was placed in the supine position.  After induction of general endotracheal anesthesia, the abdomen was prepped and draped using usual sterile technique with ChloraPrep.  Surgical site confirmation was performed.  A supraumbilical incision was made down to the fascia.  A Veress needle was introduced into the abdominal cavity and confirmation of placement was done using the saline drop test.  The abdomen was then insufflated to 15 mmHg pressure.  An 8 mm trocar was introduced into the abdominal cavity under direct visualization without difficulty.  The patient was placed in reverse Trendelenburg position and additional 8 mm trocars were placed in the left upper quadrant, right lower quadrant, and right flank regions.  The robot was then docked and targeted.  The liver appeared to be within normal limits.  The gallbladder was retracted in a dynamic fashion in order to provide a critical view of the triangle of Calot.  The cystic duct was first identified.  Its juncture to the infundibulum was fully identified.  Hem-o-lok clips were placed proximally and distally on the cystic duct and the cystic duct was divided.  This was likewise done the cystic artery.  The gallbladder was freed away  from the gallbladder fossa using Bovie electrocautery.  The gallbladder was delivered through the left upper quadrant trocar site using an Endo Catch bag without difficulty.  The gallbladder fossa was inspected no abnormal bleeding or bile leakage was noted.  Dr. Charlotta Newton then took over and proceeded with her portion of the procedure.  Complications: None  EBL: Minimal  Specimen: Gallbladder

## 2023-07-07 NOTE — Discharge Instructions (Addendum)
HOME INSTRUCTIONS  Please note any unusual or excessive bleeding, pain, swelling. Mild dizziness or drowsiness are normal for about 24 hours after surgery.   Shower when comfortable  Restrictions: No driving for 24 hours or while taking pain medications.  Activity:  No heavy lifting (> 10 lbs), nothing in vagina (no tampons, douching, or intercourse) x 4 weeks; no tub baths for 4 weeks Vaginal spotting is expected but if your bleeding is heavy, period like,  please call the office   Diet:  You may return to your regular diet.  Do not eat large meals.  Eat small frequent meals throughout the day.  Continue to drink a good amount of water at least 6-8 glasses of water per day, hydration is very important for the healing process.  Pain Management: You may take over the counter ibuprofen and tylenol every 6-8 hours as needed.  For moderate to severe pain, a prescription of oxycodone has been sent in.  This medication causes constipation, be sure to take a stool softener twice daily while taking this medication.  Alcohol -- Avoid for 24 hours and while taking pain medications.  Nausea: Take sips of ginger ale or soda  Fever -- Call physician if temperature over 101 degrees  Follow up:  If you do not already have a follow up appointment scheduled, please call the office at [phone].  If you experience fever (a temperature greater than 100.4), pain unrelieved by pain medication, shortness of breath, swelling of a single leg, or any other symptoms which are concerning to you please the office immediately.

## 2023-07-07 NOTE — Interval H&P Note (Signed)
History and Physical Interval Note:  07/07/2023 7:14 AM  Kristina Scott  has presented today for surgery, with the diagnosis of desires sterilization-Z30.09, gallstone pancreatits.  The various methods of treatment have been discussed with the patient and family. After consideration of risks, benefits and other options for treatment, the patient has consented to  Procedure(s): LAPAROSCOPIC BILATERAL SALPINGECTOMY (Bilateral) REMOVAL OF NON VAGINAL CONTRACEPTIVE DEVICE (N/A) XI ROBOTIC ASSISTED LAPAROSCOPIC CHOLECYSTECTOMY (N/A) as a surgical intervention.  The patient's history has been reviewed, patient examined, no change in status, stable for surgery.  I have reviewed the patient's chart and labs.  Questions were answered to the patient's satisfaction.     Franky Macho

## 2023-07-07 NOTE — Anesthesia Preprocedure Evaluation (Signed)
Anesthesia Evaluation  Patient identified by MRN, date of birth, ID band Patient awake    Reviewed: Allergy & Precautions, H&P , NPO status , Patient's Chart, lab work & pertinent test results, reviewed documented beta blocker date and time   Airway Mallampati: II  TM Distance: >3 FB Neck ROM: full    Dental no notable dental hx.    Pulmonary neg pulmonary ROS, former smoker   Pulmonary exam normal breath sounds clear to auscultation       Cardiovascular Exercise Tolerance: Good hypertension, negative cardio ROS  Rhythm:regular Rate:Normal     Neuro/Psych negative neurological ROS  negative psych ROS   GI/Hepatic negative GI ROS, Neg liver ROS,,,  Endo/Other    Class 4 obesity  Renal/GU negative Renal ROS  negative genitourinary   Musculoskeletal   Abdominal   Peds  Hematology negative hematology ROS (+)   Anesthesia Other Findings   Reproductive/Obstetrics negative OB ROS                             Anesthesia Physical Anesthesia Plan  ASA: 3  Anesthesia Plan: General and General ETT   Post-op Pain Management:    Induction:   PONV Risk Score and Plan: Ondansetron  Airway Management Planned:   Additional Equipment:   Intra-op Plan:   Post-operative Plan:   Informed Consent: I have reviewed the patients History and Physical, chart, labs and discussed the procedure including the risks, benefits and alternatives for the proposed anesthesia with the patient or authorized representative who has indicated his/her understanding and acceptance.     Dental Advisory Given  Plan Discussed with: CRNA  Anesthesia Plan Comments:        Anesthesia Quick Evaluation

## 2023-07-08 LAB — SURGICAL PATHOLOGY

## 2023-07-09 NOTE — Anesthesia Postprocedure Evaluation (Signed)
Anesthesia Post Note  Patient: Kristina Scott  Procedure(s) Performed: LAPAROSCOPIC BILATERAL SALPINGECTOMY (Bilateral: Abdomen) REMOVAL OF NON VAGINAL CONTRACEPTIVE DEVICE -LEFT ARM NEXPLANON (Arm Upper) XI ROBOTIC ASSISTED LAPAROSCOPIC CHOLECYSTECTOMY (Abdomen)  Patient location during evaluation: Phase II Anesthesia Type: General Level of consciousness: awake Pain management: pain level controlled Vital Signs Assessment: post-procedure vital signs reviewed and stable Respiratory status: spontaneous breathing and respiratory function stable Cardiovascular status: blood pressure returned to baseline and stable Postop Assessment: no headache and no apparent nausea or vomiting Anesthetic complications: no Comments: Late entry   No notable events documented.   Last Vitals:  Vitals:   07/07/23 1006 07/07/23 1017  BP:  (!) 141/89  Pulse: 72 68  Resp: (!) 22   Temp:  36.6 C  SpO2: 100% 98%    Last Pain:  Vitals:   07/08/23 1354  TempSrc:   PainSc: 0-No pain                 Windell Norfolk

## 2023-07-10 ENCOUNTER — Encounter (HOSPITAL_COMMUNITY): Payer: Self-pay | Admitting: Obstetrics & Gynecology

## 2023-07-17 ENCOUNTER — Encounter: Payer: Self-pay | Admitting: Obstetrics & Gynecology

## 2023-07-17 ENCOUNTER — Ambulatory Visit: Payer: Medicaid Other | Admitting: Obstetrics & Gynecology

## 2023-07-17 VITALS — BP 137/86 | HR 83 | Ht 64.0 in | Wt 352.0 lb

## 2023-07-17 DIAGNOSIS — Z9889 Other specified postprocedural states: Secondary | ICD-10-CM

## 2023-07-17 NOTE — Progress Notes (Signed)
  HPI: Patient returns for routine postoperative follow-up having undergone laparoscopic bilateral salpingectomy and RA lap choley on 07/07/23.  The patient's immediate postoperative recovery has been unremarkable. Since hospital discharge the patient reports no problems.   Current Outpatient Medications: acetaminophen (TYLENOL) 325 MG tablet, Take 2 tablets (650 mg total) by mouth every 6 (six) hours as needed (for pain scale < 4)., Disp: 30 tablet, Rfl: 0 ibuprofen (ADVIL) 600 MG tablet, Take 1 tablet (600 mg total) by mouth every 6 (six) hours., Disp: 30 tablet, Rfl: 0 labetalol (NORMODYNE) 200 MG tablet, Take 2 tablets (400 mg total) by mouth 3 (three) times daily., Disp: 180 tablet, Rfl: 1 lisinopril (ZESTRIL) 40 MG tablet, Take 40 mg by mouth daily. Takes 0.5 mg daily, Disp: , Rfl:  NIFEdipine (ADALAT CC) 30 MG 24 hr tablet, Take 1 tablet (30 mg total) by mouth at bedtime., Disp: 90 tablet, Rfl: 3 NIFEdipine (ADALAT CC) 90 MG 24 hr tablet, Take 1 tablet (90 mg total) by mouth daily., Disp: 90 tablet, Rfl: 3 prenatal vitamin w/FE, FA (PRENATAL 1 + 1) 27-1 MG TABS tablet, Take 1 tablet by mouth daily at 12 noon., Disp: 30 tablet, Rfl: 12  No current facility-administered medications for this visit.    Blood pressure (!) 143/94, pulse 79, height 5\' 4"  (1.626 m), weight (!) 352 lb (159.7 kg), last menstrual period 06/16/2023, not currently breastfeeding.  Physical Exam: All incisions look good healing well  Diagnostic Tests:   Pathology: benign  Impression + Management plan:   ICD-10-CM   1. Post-operative state: Lap BS + RA lap choley 07/07/23>doing well  Z98.890           Medications Prescribed this encounter: No orders of the defined types were placed in this encounter.     Follow up: prn   Lazaro Arms, MD Attending Physician for the Center for [facility] and [facility] 07/17/2023 8:58 AM

## 2023-09-01 ENCOUNTER — Encounter: Payer: Self-pay | Admitting: Cardiology

## 2023-09-09 ENCOUNTER — Ambulatory Visit: Payer: Medicaid Other | Admitting: Cardiology

## 2023-11-23 ENCOUNTER — Encounter: Payer: Self-pay | Admitting: Cardiology

## 2023-11-26 NOTE — Telephone Encounter (Signed)
 Left message for patient to call back

## 2023-11-27 ENCOUNTER — Ambulatory Visit: Payer: Medicaid Other | Admitting: Cardiology
